# Patient Record
Sex: Male | Born: 1960 | Race: White | Hispanic: No | Marital: Married | Smoking: Never smoker
Health system: Southern US, Community
[De-identification: ages and names within clinical notes are randomized; demographics above are authoritative.]

## PROBLEM LIST (undated history)

## (undated) DIAGNOSIS — K635 Polyp of colon: Secondary | ICD-10-CM

## (undated) DIAGNOSIS — G4730 Sleep apnea, unspecified: Secondary | ICD-10-CM

## (undated) DIAGNOSIS — Z87442 Personal history of urinary calculi: Secondary | ICD-10-CM

## (undated) DIAGNOSIS — K449 Diaphragmatic hernia without obstruction or gangrene: Secondary | ICD-10-CM

## (undated) DIAGNOSIS — K219 Gastro-esophageal reflux disease without esophagitis: Secondary | ICD-10-CM

## (undated) DIAGNOSIS — E785 Hyperlipidemia, unspecified: Secondary | ICD-10-CM

## (undated) DIAGNOSIS — I219 Acute myocardial infarction, unspecified: Secondary | ICD-10-CM

## (undated) DIAGNOSIS — I2510 Atherosclerotic heart disease of native coronary artery without angina pectoris: Secondary | ICD-10-CM

## (undated) DIAGNOSIS — Z8719 Personal history of other diseases of the digestive system: Secondary | ICD-10-CM

## (undated) DIAGNOSIS — I10 Essential (primary) hypertension: Secondary | ICD-10-CM

## (undated) DIAGNOSIS — I209 Angina pectoris, unspecified: Secondary | ICD-10-CM

## (undated) DIAGNOSIS — M1990 Unspecified osteoarthritis, unspecified site: Secondary | ICD-10-CM

## (undated) DIAGNOSIS — Z8489 Family history of other specified conditions: Secondary | ICD-10-CM

## (undated) DIAGNOSIS — T7840XA Allergy, unspecified, initial encounter: Secondary | ICD-10-CM

## (undated) DIAGNOSIS — K5790 Diverticulosis of intestine, part unspecified, without perforation or abscess without bleeding: Secondary | ICD-10-CM

## (undated) DIAGNOSIS — C187 Malignant neoplasm of sigmoid colon: Secondary | ICD-10-CM

## (undated) DIAGNOSIS — C7800 Secondary malignant neoplasm of unspecified lung: Secondary | ICD-10-CM

## (undated) HISTORY — DX: Essential (primary) hypertension: I10

## (undated) HISTORY — DX: Hyperlipidemia, unspecified: E78.5

## (undated) HISTORY — PX: COLON SURGERY: SHX602

## (undated) HISTORY — PX: TONSILLECTOMY: SUR1361

## (undated) HISTORY — DX: Gastro-esophageal reflux disease without esophagitis: K21.9

## (undated) HISTORY — PX: LUNG REMOVAL, PARTIAL: SHX233

## (undated) HISTORY — DX: Atherosclerotic heart disease of native coronary artery without angina pectoris: I25.10

## (undated) HISTORY — DX: Diaphragmatic hernia without obstruction or gangrene: K44.9

## (undated) HISTORY — DX: Polyp of colon: K63.5

## (undated) HISTORY — DX: Personal history of other diseases of the digestive system: Z87.19

## (undated) HISTORY — DX: Malignant neoplasm of sigmoid colon: C18.7

## (undated) HISTORY — DX: Sleep apnea, unspecified: G47.30

## (undated) HISTORY — DX: Acute myocardial infarction, unspecified: I21.9

## (undated) HISTORY — DX: Secondary malignant neoplasm of unspecified lung: C78.00

## (undated) HISTORY — DX: Allergy, unspecified, initial encounter: T78.40XA

## (undated) HISTORY — PX: KNEE SURGERY: SHX244

## (undated) HISTORY — PX: CORONARY ANGIOPLASTY: SHX604

## (undated) HISTORY — PX: CYST REMOVAL NECK: SHX6281

## (undated) HISTORY — PX: COLONOSCOPY: SHX174

## (undated) HISTORY — DX: Diverticulosis of intestine, part unspecified, without perforation or abscess without bleeding: K57.90

---

## 1996-05-18 HISTORY — PX: COLON RESECTION: SHX5231

## 1996-12-16 DIAGNOSIS — C187 Malignant neoplasm of sigmoid colon: Secondary | ICD-10-CM

## 1996-12-16 HISTORY — DX: Malignant neoplasm of sigmoid colon: C18.7

## 1996-12-22 DIAGNOSIS — C187 Malignant neoplasm of sigmoid colon: Secondary | ICD-10-CM | POA: Insufficient documentation

## 1996-12-29 HISTORY — PX: COLON RESECTION: SHX5231

## 1997-05-18 HISTORY — PX: LUNG REMOVAL, PARTIAL: SHX233

## 1997-06-27 DIAGNOSIS — C7800 Secondary malignant neoplasm of unspecified lung: Secondary | ICD-10-CM

## 1997-06-27 HISTORY — DX: Secondary malignant neoplasm of unspecified lung: C78.00

## 2004-03-21 ENCOUNTER — Ambulatory Visit: Payer: Self-pay | Admitting: Cardiology

## 2004-04-03 ENCOUNTER — Ambulatory Visit: Payer: Self-pay

## 2004-11-07 ENCOUNTER — Ambulatory Visit (HOSPITAL_BASED_OUTPATIENT_CLINIC_OR_DEPARTMENT_OTHER): Admission: RE | Admit: 2004-11-07 | Discharge: 2004-11-07 | Payer: Self-pay | Admitting: Orthopedic Surgery

## 2004-11-07 ENCOUNTER — Ambulatory Visit (HOSPITAL_COMMUNITY): Admission: RE | Admit: 2004-11-07 | Discharge: 2004-11-07 | Payer: Self-pay | Admitting: Orthopedic Surgery

## 2004-11-07 ENCOUNTER — Encounter (INDEPENDENT_AMBULATORY_CARE_PROVIDER_SITE_OTHER): Payer: Self-pay | Admitting: *Deleted

## 2005-05-18 HISTORY — PX: CORONARY STENT PLACEMENT: SHX1402

## 2005-07-01 ENCOUNTER — Ambulatory Visit: Payer: Self-pay | Admitting: Cardiology

## 2005-07-14 ENCOUNTER — Ambulatory Visit: Payer: Self-pay | Admitting: Internal Medicine

## 2005-07-14 ENCOUNTER — Inpatient Hospital Stay (HOSPITAL_BASED_OUTPATIENT_CLINIC_OR_DEPARTMENT_OTHER): Admission: RE | Admit: 2005-07-14 | Discharge: 2005-07-14 | Payer: Self-pay | Admitting: Cardiology

## 2005-07-20 ENCOUNTER — Ambulatory Visit: Payer: Self-pay | Admitting: Cardiology

## 2005-07-20 ENCOUNTER — Inpatient Hospital Stay (HOSPITAL_COMMUNITY): Admission: RE | Admit: 2005-07-20 | Discharge: 2005-07-21 | Payer: Self-pay | Admitting: Cardiology

## 2005-07-30 ENCOUNTER — Ambulatory Visit: Payer: Self-pay | Admitting: Cardiology

## 2006-09-10 ENCOUNTER — Ambulatory Visit: Payer: Self-pay | Admitting: Cardiology

## 2007-03-04 ENCOUNTER — Ambulatory Visit: Payer: Self-pay | Admitting: Internal Medicine

## 2007-03-28 ENCOUNTER — Encounter: Payer: Self-pay | Admitting: Internal Medicine

## 2007-03-28 ENCOUNTER — Ambulatory Visit: Payer: Self-pay | Admitting: Internal Medicine

## 2007-03-28 DIAGNOSIS — K449 Diaphragmatic hernia without obstruction or gangrene: Secondary | ICD-10-CM | POA: Insufficient documentation

## 2007-07-21 DIAGNOSIS — C7800 Secondary malignant neoplasm of unspecified lung: Secondary | ICD-10-CM | POA: Insufficient documentation

## 2007-07-21 DIAGNOSIS — I2510 Atherosclerotic heart disease of native coronary artery without angina pectoris: Secondary | ICD-10-CM | POA: Insufficient documentation

## 2007-07-21 DIAGNOSIS — G4730 Sleep apnea, unspecified: Secondary | ICD-10-CM | POA: Insufficient documentation

## 2007-07-21 DIAGNOSIS — I10 Essential (primary) hypertension: Secondary | ICD-10-CM | POA: Insufficient documentation

## 2007-07-21 DIAGNOSIS — E785 Hyperlipidemia, unspecified: Secondary | ICD-10-CM | POA: Insufficient documentation

## 2007-07-21 DIAGNOSIS — G4733 Obstructive sleep apnea (adult) (pediatric): Secondary | ICD-10-CM | POA: Insufficient documentation

## 2007-09-09 ENCOUNTER — Ambulatory Visit: Payer: Self-pay | Admitting: Cardiology

## 2007-10-14 ENCOUNTER — Telehealth: Payer: Self-pay | Admitting: Internal Medicine

## 2008-08-10 ENCOUNTER — Ambulatory Visit: Payer: Self-pay | Admitting: Cardiology

## 2008-10-21 ENCOUNTER — Emergency Department (HOSPITAL_COMMUNITY): Admission: EM | Admit: 2008-10-21 | Discharge: 2008-10-21 | Payer: Self-pay | Admitting: Emergency Medicine

## 2008-11-02 ENCOUNTER — Ambulatory Visit: Payer: Self-pay | Admitting: Cardiology

## 2008-11-09 ENCOUNTER — Ambulatory Visit: Payer: Self-pay | Admitting: Cardiology

## 2008-11-23 ENCOUNTER — Telehealth: Payer: Self-pay | Admitting: Cardiology

## 2009-06-24 ENCOUNTER — Telehealth: Payer: Self-pay | Admitting: Internal Medicine

## 2009-06-27 DIAGNOSIS — Z8719 Personal history of other diseases of the digestive system: Secondary | ICD-10-CM | POA: Insufficient documentation

## 2009-06-28 ENCOUNTER — Ambulatory Visit: Payer: Self-pay | Admitting: Internal Medicine

## 2009-06-28 ENCOUNTER — Encounter (INDEPENDENT_AMBULATORY_CARE_PROVIDER_SITE_OTHER): Payer: Self-pay | Admitting: *Deleted

## 2009-06-28 DIAGNOSIS — K625 Hemorrhage of anus and rectum: Secondary | ICD-10-CM | POA: Insufficient documentation

## 2009-06-28 DIAGNOSIS — K219 Gastro-esophageal reflux disease without esophagitis: Secondary | ICD-10-CM | POA: Insufficient documentation

## 2009-06-28 DIAGNOSIS — Z85038 Personal history of other malignant neoplasm of large intestine: Secondary | ICD-10-CM | POA: Insufficient documentation

## 2009-06-28 LAB — CONVERTED CEMR LAB
ALT: 30 units/L (ref 0–53)
Albumin: 4.2 g/dL (ref 3.5–5.2)
Basophils Absolute: 0 10*3/uL (ref 0.0–0.1)
CO2: 31 meq/L (ref 19–32)
Calcium: 9.5 mg/dL (ref 8.4–10.5)
Chloride: 103 meq/L (ref 96–112)
Eosinophils Relative: 3 % (ref 0.0–5.0)
GFR calc non Af Amer: 127.55 mL/min (ref >60)
Glucose, Bld: 99 mg/dL (ref 70–99)
Lymphocytes Relative: 23.4 % (ref 12.0–46.0)
Lymphs Abs: 1.5 10*3/uL (ref 0.7–4.0)
Monocytes Relative: 8.4 % (ref 3.0–12.0)
Neutrophils Relative %: 65.2 % (ref 43.0–77.0)
Platelets: 208 10*3/uL (ref 150.0–400.0)
RDW: 12 % (ref 11.5–14.6)
Sodium: 140 meq/L (ref 135–145)
Total Bilirubin: 0.6 mg/dL (ref 0.3–1.2)
Total Protein: 6.6 g/dL (ref 6.0–8.3)
WBC: 6.6 10*3/uL (ref 4.5–10.5)

## 2009-08-02 ENCOUNTER — Ambulatory Visit: Payer: Self-pay | Admitting: Internal Medicine

## 2009-08-06 ENCOUNTER — Encounter: Payer: Self-pay | Admitting: Internal Medicine

## 2009-09-02 ENCOUNTER — Ambulatory Visit: Payer: Self-pay | Admitting: Cardiology

## 2009-10-21 ENCOUNTER — Telehealth: Payer: Self-pay | Admitting: Internal Medicine

## 2010-06-17 NOTE — Assessment & Plan Note (Signed)
Summary: EC6  Medications Added CRESTOR 20 MG TABS (ROSUVASTATIN CALCIUM) take one tablet once daily NIASPAN 500 MG CR-TABS (NIACIN (ANTIHYPERLIPIDEMIC)) take one tablet daily PLAVIX 75 MG TABS (CLOPIDOGREL BISULFATE) once daily-pt has not taken since Feb.      Allergies Added: NKDA  Primary Provider:  Gari Crown, [place]  CC:  ec6/  Pt to reestablish.  .  History of Present Illness: The patient is 50 years old and comes in today to get established for cardiology care in [place]. In 2007 he presented with unstable angina and was treated with 2 drug-eluting stents to the LAD and diagonal branch of the LAD by Dr. Samule Ohm. He had a V. stent technique with a 2.75 x 20 mm Taxus in the LAD and a 2.5 x 20 mm Taxus in the diagonal. He has done well since that time has had no recent chest pain shortness of breath or palpitations. He did have chest pain last summer and had a exercise test which was negative.  His other problems include hypertension and hyperlipidemia and obstructive sleep apnea. 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 He is had no recurrence since that time. He'll so has Barrett's esophagus and is followed by Dr. Lina Sar.  He lives in [place] but works in [place] and a disease here for him to obtain his medical care here in [place] that he would like to get established here.  Current Medications (verified): 1)  Aspirin 325 Mg Tabs (Aspirin) .... Take One By Mouth Once Daily 2)  Multivitamins  Tabs (Multiple Vitamin) .... Take One By Mouth Once Daily 3)  Norvasc 5 Mg Tabs (Amlodipine Besylate) .... Take One By Mouth Once Daily 4)  Crestor 20 Mg Tabs (Rosuvastatin Calcium) .... Take One Tablet Once Daily 5)  Cpap .... Use While Sleeping 6)  Lovaza 1 Gm Caps (Omega-3-Acid Ethyl Esters) .... Take One By Mouth Once Daily 7)   Niaspan 500 Mg Cr-Tabs (Niacin (Antihyperlipidemic)) .... Take One Tablet Daily 8)  Vitamin D3 Super Strength 2000 Unit Tabs (Cholecalciferol) .... Once Daily 9)  Claritin 10 Mg Tabs (Loratadine) .... Take 1 Tablet By Mouth Daily 10)  Plavix 75 Mg Tabs (Clopidogrel Bisulfate) .... Once Daily-Pt Has Not Taken Since Feb. 11)  Fexofenadine Hcl 180 Mg Tabs (Fexofenadine Hcl) .... Once Daily 12)  Tylenol Sinus Max St 30-500 Mg Tabs (Pseudoephedrine-Apap) .... Take 2 Tablet By Mouth Two Times A Day As Needed  Allergies (verified): No Known Drug Allergies  Past History:  Past Medical History: Reviewed history from 06/28/2009 and no changes required. 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   Vital Signs:  Patient profile:   50 year old male Height:      71 inches Weight:      213 pounds BMI:     29.81 Pulse rate:   86 / minute BP sitting:   124 / 80  (left arm) Cuff size:   large  Vitals Entered By: Judithe Modest CMA (September 02, 2009 4:42 PM)  Physical Exam  Additional  Exam:  Gen. Well-nourished, in no distress   Neck: No JVD, thyroid not enlarged, no carotid bruits Lungs: No tachypnea, clear without rales, rhonchi or wheezes Cardiovascular: Rhythm regular, PMI not displaced,  heart sounds  normal, no murmurs or gallops, no peripheral edema, pulses normal in all 4 extremities. Abdomen: BS normal, abdomen soft and non-tender without masses or organomegaly, no hepatosplenomegaly. MS: No deformities, no cyanosis or clubbing   Neuro:  No focal sns   Skin:  no lesions    Impression &  Recommendations:  Problem # 1:  CAD (ICD-414.00) He had the standing with 2 drug-eluting stents in 2007 to the LAD and diagonal branch. He had negative stress test last summer and he said no recent symptoms. Her biggest problem is stable. Because he has first-generation drug-eluting stents in because he has 2 stents with a V. tach the, I think his risk of late stent thrombosis is higher than usual and I have recommended that he stay on the Plavix. His updated medication list for this problem includes:    Aspirin 325 Mg Tabs (Aspirin) .Marland Kitchen... Take one by mouth once daily    Norvasc 5 Mg Tabs (Amlodipine besylate) .Marland Kitchen... Take one by mouth once daily    Plavix 75 Mg Tabs (Clopidogrel bisulfate) ..... Once daily-pt has not taken since feb.  Orders: EKG w/ Interpretation (93000)  Problem # 2:  HYPERTENSION (ICD-401.9) This appears well controlled on current medications. His updated medication list for this problem includes:    Aspirin 325 Mg Tabs (Aspirin) .Marland Kitchen... Take one by mouth once daily    Norvasc 5 Mg Tabs (Amlodipine besylate) .Marland Kitchen... Take one by mouth once daily  Problem # 3:  DYSLIPIDEMIA (ICD-272.4) He had a recent lipid profile which he says is good. We will continue his current medications. His updated medication list for this problem includes:    Crestor 20 Mg Tabs (Rosuvastatin calcium) .Marland Kitchen... Take one tablet once daily    Lovaza 1 Gm Caps (Omega-3-acid ethyl esters) .Marland Kitchen... Take one by mouth once daily    Niaspan 500 Mg Cr-tabs (Niacin (antihyperlipidemic)) .Marland Kitchen... Take one tablet daily  Patient Instructions: 1)  Your physician wants you to follow-up in: 1 year with Dr. Shirlee Latch.  You will receive a reminder letter in the mail two months in advance. If you don't receive a letter, please call our office to schedule the follow-up appointment. 2)  Your physician recommends that you continue on your current medications as directed. Please refer to the Current Medication list given to you today.

## 2010-06-17 NOTE — Assessment & Plan Note (Signed)
Summary: HX. COLON CANCER.   CONSTIPATION AND RECTAL BLEEDING    (DR.B...    History of Present Illness Visit Type: Follow-up Visit Primary GI MD: [name] Primary Provider: [name] Chief Complaint: constipation/rectal bleedikng, hx of colon cancer History of Present Illness:   50 YO MALE KNOWN TO DR. [name] WHO WAS DX WITH COLON CANCER IN 1998. HE UNDERWENT SIGMOID RESECTION. 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 HE HAS HAD ROUTINE ONCOLOGY FOLLOW UP IN [place] WITH DR. [name] ,AND HAS HAD SERIAL COLONOSCOPIES. LAST COLON WAS DONE IN 11/08 AND WAS NORMAL. HE ALSO HAS HX OF GERD AND BARRETTS. LAST EGD 11/08,BX'S NEGATIVE FOR BARRETTS. PT HAS HX OF HEART DISEASE,IS S/P TAXUS STENTS X 2 IN 2007. HE WAS ON PLAVIX FOR ONE YEAR THEN STOPPED,RECENTLY PLACED BACK ON IT X 3 MONTHS.  HE COMES IN TODAY WITH INTERMITTENT BRB PER RECTUM OVER THE PAST COUPLE WEEKS. HE IS SEEING BLOOD ON THE TISSUE, NO MIXED WITH STOOL. HE HAD BEEN CONSTIPATED ,THINKS MAY HAVE AGGRAVATED THE BLEEDING. NOW ON METAMUCIL WHICH IS HELPING. NO RECTAL PAIN,DISCOMFORT,NO ABDOMINALPAIN. HE IS ON NEXIUM DAILY,NO C/O DYSPHAGIA . HE HAD BEEN OFF PPI FOR AWHILE AND REFLUX SXS RECURRED -BACK ON NEXIUM PAST 3 MONTHS. HE WOULD LIKE TO HAVE AN ENDOSCOPY AS WELL.   GI Review of Systems     Location of  Abdominal pain: RUQ.    Denies abdominal pain, acid reflux, belching, bloating, chest pain, dysphagia with liquids, dysphagia with solids, heartburn, loss of appetite, nausea, vomiting, vomiting blood, weight loss, and  weight gain.      Reports constipation and  rectal bleeding.     Denies anal fissure, black tarry stools, change in bowel habit, diarrhea, diverticulosis, fecal incontinence, heme positive stool, hemorrhoids, irritable bowel syndrome, jaundice, light color stool, liver problems, and  rectal pain.    Current Medications (verified): 1)   Aspirin 325 Mg Tabs (Aspirin) .... Take One By Mouth Once Daily 2)  Multivitamins  Tabs (Multiple Vitamin) .... Take One By Mouth Once Daily 3)  Norvasc 5 Mg Tabs (Amlodipine Besylate) .... Take One By Mouth Once Daily 4)  Crestor 10 Mg Tabs (Rosuvastatin Calcium) .... Take One By Mouth Once Daily 5)  Cpap .... Use While Sleeping 6)  Lovaza 1 Gm Caps (Omega-3-Acid Ethyl Esters) .... Take One By Mouth Once Daily 7)  Niaspan 500 Mg Cr-Tabs (Niacin (Antihyperlipidemic)) .... Take One By Mouth Every Morning and Two By Mouth Every Evening 8)  Vitamin D3 Super Strength 2000 Unit Tabs (Cholecalciferol) .... Once Daily 9)  Claritin 10 Mg Tabs (Loratadine) .... Take 1 Tablet By Mouth Daily 10)  Plavix 75 Mg Tabs (Clopidogrel Bisulfate) .... Once Daily 11)  Fexofenadine Hcl 180 Mg Tabs (Fexofenadine Hcl) .... Once Daily 12)  Tylenol Sinus Max St 30-500 Mg Tabs (Pseudoephedrine-Apap) .... Take 2 Tablet By Mouth Two Times A Day As Needed 13)  Metamucil 30.9 % Powd (Psyllium) .... 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 Family History of Diabetes: father Family History of Heart Disease: mother, father No FH of Colon Cancer: Family History of Colon Polyps: mother  Social History: Reviewed history from 01/11/2009 and no changes  required. Full Time Married  Tobacco Use - No.  Alcohol Use - no Drug Use - no  Review of Systems       The patient complains of allergy/sinus and heart rhythm changes.  The patient denies anemia, anxiety-new, arthritis/joint pain, back pain, blood in urine, breast changes/lumps, change in vision, confusion, cough, coughing up blood, depression-new, fainting, fatigue, fever, headaches-new, hearing problems, heart murmur, itching, muscle pains/cramps, night sweats, nosebleeds, pregnancy symptoms, shortness of breath, skin rash, sleeping problems, sore throat, swelling of feet/legs, swollen lymph glands, thirst - excessive, urination - excessive, urination changes/pain, urine leakage, vision changes, and voice change.         ROS OTHERWISE AS IN HPI  Vital Signs:  Patient profile:   50 year old male Height:      71 inches Weight:      211.25 pounds BMI:     29.57 Pulse rate:   72 / minute Pulse rhythm:   regular BP sitting:   144 / 80  (left arm) Cuff size:   regular  Vitals Entered By: June McMurray CMA Duncan Dull) (June 28, 2009 9:41 AM)  Physical Exam  General:  Well developed, well nourished, no acute distress. Head:  Normocephalic and atraumatic. Eyes:  PERRLA, no icterus. Lungs:  Clear throughout to auscultation. Heart:  Regular rate and rhythm; no murmurs, rubs,  or bruits. Abdomen:  SOFT,NONTENDER, NO MASS OR HSM,MIDLINE INCISIONAL SCAR, NO MASS OR PALP HSM Rectal:  NOT DONE Extremities:  No clubbing, cyanosis, edema or deformities noted. Neurologic:  Alert and  oriented x4;  grossly normal neurologically. Psych:  Alert and cooperative. Normal mood and affect.   Impression & Recommendations:  Problem # 1:  RECTAL BLEEDING (ICD-569.3) Assessment New 50 YO MALE WITH FEW WEEK HX OF INTERMITTENT HEMATOCHEZIA ,IN SETTING OF HX OF SIGMOID COLON CANCER 1998;LATER WITH MET TO LUNG RESECTED 2000. R/O RECURRENT NEOPLASM,VS LOCAL ANORECTAL SOURCE.   LABS AS BELOW  SCHEDULE  FOR COLONOSCOPY WITH DR BRODIE, WILL GET CARDIOLOGY CLEARANCE REGARDING COMING OFF PLAVIX 5 DAYS PRE PROCEDURE. Orders: Colon/Endo (Colon/Endo) TLB-CMP (Comprehensive Metabolic Pnl) (80053-COMP) TLB-CBC Platelet - w/Differential (85025-CBCD)  Problem # 2:  PERSONAL HX COLON CANCER (ICD-V10.05) Assessment: Comment Only SEE ABOVE  Problem # 3:  GERD (ICD-530.81) Assessment: Unchanged CHRONIC  HX OF BARRETTS-HOWEVER LAST EGD 2008 NEGATIVE FOR BARRETTS. SCHEULE FOR FOLLOW UP EGD WITH DR BRODIE. CONTINUE NEXIUM 40 MG DAILY  Problem # 4:  CAD (ICD-414.00) Assessment: Comment Only S/P TAXUS STENTS X 2 2007-ON PLAVIX AND ASA.  Problem # 5:  SLEEP APNEA (ICD-780.57) Assessment: Comment Only  Problem # 6:  HYPERTENSION (ICD-401.9) Assessment: Comment Only  Patient Instructions: 1)  Go to the basement to for you labs.  2)  Come for you Colonoscopy/EGD with Dr. [name] on 08/02/2009.  3)  We will contact you about coming off your Plavix for your procedure after we hear back from Dr. Andee Lineman. 4)  Your prep has been sent to [facility]. 5)  The medication list was reviewed and reconciled.  All changed / newly prescribed medications were explained.  A complete medication list was provided to the patient / caregiver. Prescriptions: MOVIPREP 100 GM  SOLR (PEG-KCL-NACL-NASULF-NA ASC-C) As per  prep instructions.  #1 x 0   Entered by:   Harlow Mares CMA (AAMA)   Authorized by:   Sammuel Cooper PA-c   Signed by:   Harlow Mares CMA (AAMA) on 06/28/2009   Method used:   Electronically to        [facility] [address] (retail)       [address] [place], [place]  [address]       Ph: [phone]       Fax: [phone]   RxID:   [id number]

## 2010-06-17 NOTE — Letter (Signed)
Summary: [facility] Instructions  [facility]  [address] [place], [place] [address]   Phone: [phone]  Fax: [phone]       [name] [name]    [birth year]    MRN: [id number]        Procedure Day /Date: 08/02/2009 (Friday)     Arrival Time: 12:30pm       Procedure Time: 1:30pm     Location of Procedure:                     X  [facility] (4th Floor)   PREPARATION FOR COLONOSCOPY WITH MOVIPREP   Starting 5 days prior to your procedure 07/28/2009 do not eat nuts, seeds, popcorn, corn, beans, peas,  salads, or any raw vegetables.  Do not take any fiber supplements (e.g. Metamucil, Citrucel, and Benefiber).  THE DAY BEFORE YOUR PROCEDURE         DATE: 08/01/2009  DAY: Thursday  1.  Drink clear liquids the entire day-NO SOLID FOOD  2.  Do not drink anything colored red or purple.  Avoid juices with pulp.  No orange juice.  3.  Drink at least 64 oz. (8 glasses) of fluid/clear liquids during the day to prevent dehydration and help the prep work efficiently.  CLEAR LIQUIDS INCLUDE: Water Jello Ice Popsicles Tea (sugar ok, no milk/cream) Powdered fruit flavored drinks Coffee (sugar ok, no milk/cream) Gatorade Juice: apple, white grape, white cranberry  Lemonade Clear bullion, consomm, broth Carbonated beverages (any kind) Strained chicken noodle soup Hard Candy                             4.  In the morning, mix first dose of MoviPrep solution:    Empty 1 Pouch A and 1 Pouch B into the disposable container    Add lukewarm drinking water to the top line of the container. Mix to dissolve    Refrigerate (mixed solution should be used within 24 hrs)  5.  Begin drinking the prep at 5:00 p.m. The MoviPrep container is divided by 4 marks.   Every 15 minutes drink the solution down to the next mark (approximately 8 oz) until the full liter is complete.   6.  Follow completed prep with 16 oz of clear liquid of your choice (Nothing red or  purple).  Continue to drink clear liquids until bedtime.  7.  Before going to bed, mix second dose of MoviPrep solution:    Empty 1 Pouch A and 1 Pouch B into the disposable container    Add lukewarm drinking water to the top line of the container. Mix to dissolve    Refrigerate  THE DAY OF YOUR PROCEDURE      DATE: 08/02/2009 DAY: Friday  Beginning at 8:30am (5 hours before procedure):         1. Every 15 minutes, drink the solution down to the next mark (approx 8 oz) until the full liter is complete.  2. Follow completed prep with 16 oz. of clear liquid of your choice.    3. You may drink clear liquids until 11:30am (2 HOURS BEFORE PROCEDURE).   MEDICATION INSTRUCTIONS  Unless otherwise instructed, you should take regular prescription medications with a small sip of water   as early as possible the morning of your procedure.  You will be contacted by our office prior to your procedure for directions on holding your Plavix.  If you  do not hear from our office 1 week prior to your scheduled procedure, please call [phone] to discuss.        OTHER INSTRUCTIONS  You will need a responsible adult at least 50 years of age to accompany you and drive you home.   This person must remain in the waiting room during your procedure.  Wear loose fitting clothing that is easily removed.  Leave jewelry and other valuables at home.  However, you may wish to bring a book to read or  an iPod/MP3 player to listen to music as you wait for your procedure to start.  Remove all body piercing jewelry and leave at home.  Total time from sign-in until discharge is approximately 2-3 hours.  You should go home directly after your procedure and rest.  You can resume normal activities the  day after your procedure.       The day of your procedure you should not:   Drive   Make legal decisions   Operate machinery   Drink alcohol   Return to work  You will receive specific  instructions about eating, activities and medications before you leave.    The above instructions have been reviewed and explained to me by   _______________________    I fully understand and can verbalize these instructions _____________________________ Date _________

## 2010-06-17 NOTE — Letter (Signed)
Summary: Anticoagulation Modification Letter  [facility]  [address] [place], [place] [address]   Phone: [phone]  Fax: [phone]    June 28, 2009  Re:    [name] [name] DOB:    1960/07/07 MRN:    [id number]    Dear Dr. Andee Lineman:  We have scheduled the above patient for an endoscopic procedure. Our records show that  he/she is on anticoagulation therapy. Please advise as to how long the patient may come off their therapy of Plavix prior to the scheduled procedure(s) on 08/02/2009.   Please fax  route the completed form to [facility], CMA (AAMA).   Thank you for your help with this matter.  Sincerely,  Harlow Mares CMA Duncan Dull)   Physician Recommendation:  Hold Plavix 7 days prior ________________  Hold Coumadin 5 days prior ____________  Other ______________________________     Appended Document: Anticoagulation Modification Letter the patient and hold Plavix 7 days prior to procedure.restart Plavix as soon as possible after procedure. if you have any other questions please contact me.  Appended Document: Anticoagulation Modification Letter Left message on patients machine to call back.   Appended Document: Anticoagulation Modification Letter patient aware

## 2010-06-17 NOTE — Letter (Signed)
Summary: Patient Notice-[facility]  [address] [place], [place] [address]   Phone: [phone]  Fax: [phone]        August 06, 2009 MRN: [id number]    [facility] [address] [place], [place]  [address]    Dear Mr. PILLEY,  I am pleased to inform you that the biopsies taken during your recent endoscopic examination did not show any evidence of cancer upon pathologic examination.  However, your biopsies indicate you have a condition known as Barrett's esophagus. While not cancer, it is pre-cancerous (can progress to cancer) and needs to be monitored with repeat endoscopic examination and biopsies.  Fortunately, it is quite rare that this develops into cancer, but careful monitoring of the condition along with taking your medication as prescribed is important in reducing the risk of developing cancer.  It is my recommendation that you have a repeat upper gastrointestinal endoscopic examination in 2_ years.  Additional information/recommendations:  __Please call [phone] to schedule a return visit to further      evaluate your condition.  _x_Continue with treatment plan as outlined the day of your exam.  Please call us if you have or develop heartburn, reflux symptoms, any swallowing problems, or if you have questions about your condition that have not been fully answered at this time.  Sincerely,  Hart Carwin MD  This letter has been electronically signed by your physician.  Appended Document: Patient Notice-[facility] Esopghagus Letter mailed 3.24.11

## 2010-06-17 NOTE — Procedures (Signed)
Summary: Colonoscopy  Patient: [name] [name] Note: All result statuses are Final unless otherwise noted.  Tests: (1) Colonoscopy (COL)   COL Colonoscopy           DONE     [facility]     [address]     [place], [place]  [address]           COLONOSCOPY PROCEDURE REPORT           PATIENT:  [name] [name], [name] [name]  MR#:  [id number]     BIRTHDATE:  10-Feb-1961, 48 yrs. old  GENDER:  male           ENDOSCOPIST:  Hedwig Morton. [name], MD     Referred by:  Corrie Mckusick, M.D.           PROCEDURE DATE:  08/02/2009     PROCEDURE:  Colonoscopy 19147     ASA CLASS:  Class II     INDICATIONS:  [facility] adenoca left colon 1998, 3/15 nodes positive,     lung met resected 2000,     last colon 2008,     rectal bleeding on Plavix           MEDICATIONS:   Versed 2 mg, Fentanyl 25 mcg           DESCRIPTION OF PROCEDURE:   After the risks benefits and     alternatives of the procedure were thoroughly explained, informed     consent was obtained.  Digital rectal exam was performed and     revealed no rectal masses.   The LB [id number]-[id number] endoscope     was introduced through the anus and advanced to the cecum, which     was identified by both the appendix and ileocecal valve, without     limitations.  The quality of the prep was good, using MoviPrep.     The instrument was then slowly withdrawn as the colon was fully     examined.     <<PROCEDUREIMAGES>>           FINDINGS:  There was evidence of a prior segmental colectomy (see     image8 and image9). wide open sigmoid anastomosis at 45 cm,  A     sessile polyp was found in the sigmoid colon. fold vs polyp at 30     cm, biopsied The polyp was removed using cold biopsy forceps (see     image6).  Mild diverticulosis was found in the sigmoid colon (see     image7).  A fissure was found in the rectum (see image11 and     image10).  normal cecum (see image2, image3, image4, and image5).     Retroflexion was not performed.  The scope was  then withdrawn from     the patient and the procedure completed.           COMPLICATIONS:  None           ENDOSCOPIC IMPRESSION:     1) Prior segmental colectomy     2) Sessile polyp in the sigmoid colon     3) Mild diverticulosis in the sigmoid colon     4) Fissure in the rectum     5) Normal cecum     s/p sigmoid resection for carcinoma 1998, no evidence of     recurrence     RECOMMENDATIONS:     1) Await biopsy results           REPEAT  EXAM:  In 5 year(s) for.           ______________________________     Hedwig Morton. [name], MD           CC:           n.     eSIGNED:   Hedwig Morton. Kyerra Vargo at 08/02/2009 02:36 PM           Page 2 of 3   [place], [place], [id number]  Note: An exclamation mark (!) indicates a result that was not dispersed into the flowsheet. Document Creation Date: 08/02/2009 2:36 PM _______________________________________________________________________  (1) Order result status: Final Collection or observation date-time: 08/02/2009 14:14 Requested date-time:  Receipt date-time:  Reported date-time:  Referring Physician:   Ordering Physician: Lina Sar [phone]) Specimen Source:  Source: Launa Grill Order Number: [id number] Lab site:   Appended Document: Colonoscopy     Procedures Next Due Date:    Colonoscopy: 08/2014

## 2010-06-17 NOTE — Procedures (Signed)
Summary: Upper Endoscopy  Patient: [name] [name] Note: All result statuses are Final unless otherwise noted.  Tests: (1) Upper Endoscopy (EGD)   EGD Upper Endoscopy       DONE     [facility]     [address]     [place], [place]  [address]           ENDOSCOPY PROCEDURE REPORT           PATIENT:  [name], [name]  MR#:  [id number]     BIRTHDATE:  1960-06-07, 48 yrs. old  GENDER:  male           ENDOSCOPIST:  Hedwig Morton. [name], MD     Referred by:           PROCEDURE DATE:  08/02/2009     PROCEDURE:  EGD with biopsy     ASA CLASS:  Class II     INDICATIONS:  h/o Barrett's Esophagus Barrett's 2001, no Barrett's     2006           MEDICATIONS:   Versed 8 mg, Fentanyl 75 mcg     TOPICAL ANESTHETIC:  Exactacain Spray           DESCRIPTION OF PROCEDURE:   After the risks benefits and     alternatives of the procedure were thoroughly explained, informed     consent was obtained.  The LB GIF-H180 [id number] endoscope was     introduced through the mouth and advanced to the descending     duodenum, without limitations.  The instrument was slowly     withdrawn as the mucosa was fully examined.     <<PROCEDUREIMAGES>>           The upper, middle, and distal third of the esophagus were     carefully inspected and no abnormalities were noted. The z-line     was well seen at the GEJ. The endoscope was pushed into the fundus     which was normal including a retroflexed view. The antrum,gastric     body, first and second part of the duodenum were unremarkable.     normal z-line, no stricture     several fundic gland polyps With standard forceps, a biopsy was     obtained and sent to pathology (see image1, image2, image3,     image4, and image5).    Retroflexed views revealed no     abnormalities.    The scope was then withdrawn from the patient     and the procedure completed.           COMPLICATIONS:  None           ENDOSCOPIC IMPRESSION:     1) Normal EGD     s/p biopsies  at the g-e junction     no endoscopic evidence of Barrett'     RECOMMENDATIONS:     resume Plavix as per Your cardiologist           REPEAT EXAM:  In 5 year(s) for.           ______________________________     Hedwig Morton. [name], MD           CC:           n.     eSIGNED:   Hedwig Morton. Brodie at 08/02/2009 02:29 PM           Alanda Amass, [phone]  Note: An exclamation mark (!) indicates a result  that was not dispersed into the flowsheet. Document Creation Date: 08/02/2009 2:29 PM _______________________________________________________________________  (1) Order result status: Final Collection or observation date-time: 08/02/2009 13:52 Requested date-time:  Receipt date-time:  Reported date-time:  Referring Physician:   Ordering Physician: Lina Sar [id number]) Specimen Source:  Source: Launa Grill Order Number: [id number] Lab site:   Appended Document: Upper Endoscopy     Procedures Next Due Date:    EGD: 08/2011

## 2010-06-17 NOTE — Progress Notes (Signed)
Summary: TRIAGE   Phone Note Call from Patient Call back at [phone]   Caller: spouse  [name] [name] Call For: Dr. [name] Reason for Call: Talk to Nurse Summary of Call: Pt has a HX of cancer and is bleeding from rectum and has had some constipation and changes in BM. Wants to know if he can be seen sooner than 08-02-09. Initial call taken by: Karna Christmas,  June 24, 2009 3:51 PM  Follow-up for Phone Call        Per pt. wife-pt. c/o increased constipation and rectal bleeding for several weeks, metamucil and other things he has tried aren't helping. Has a history of colon cancer.  Pt. is in [place], can only be seen on a Friday. He will see [name] [name] on 06-28-09 at 9:30am. If symptoms become worse call back immediately or go to ER.  Follow-up by: Laureen Ochs LPN,  June 24, 2009 4:05 PM

## 2010-06-17 NOTE — Progress Notes (Signed)
Summary: Refill  Medications Added ANALPRAM-HC 1-2.5 % CREA (HYDROCORTISONE ACE-PRAMOXINE) Apply to rectum three times a day as needed       Phone Note From Other Clinic   Caller: [facility] Call For: Dr. [name] Summary of Call: Pt is needing a refill on Analpram  90 day supply mail order  [phone] Ph# Option 2 Initial call taken by: Karna Christmas,  October 21, 2009 9:20 AM  Follow-up for Phone Call        Prescription sent to [facility]. Follow-up by: Lamona Curl CMA Duncan Dull),  October 21, 2009 9:29 AM    New/Updated Medications: ANALPRAM-HC 1-2.5 % CREA (HYDROCORTISONE ACE-PRAMOXINE) Apply to rectum three times a day as needed Prescriptions: ANALPRAM-HC 1-2.5 % CREA (HYDROCORTISONE ACE-PRAMOXINE) Apply to rectum three times a day as needed  #60 grams x 0   Entered by:   Lamona Curl CMA (AAMA)   Authorized by:   Hart Carwin MD   Signed by:   Lamona Curl CMA (AAMA) on 10/21/2009   Method used:   Electronically to        [facility]* (mail-order)             ,          Ph: [phone]       Fax: [phone]   RxID:   [id number]

## 2010-06-17 NOTE — Letter (Signed)
Summary: Patient Notice- Polyp Results  [facility]  [address] [place], [place] [address]   Phone: [phone]  Fax: [phone]        August 06, 2009 MRN: [id number]    [name] [address] [place], [place]  [address]    Dear [name],  I am pleased to inform you that the colon polyp(s) removed during your recent colonoscopy was (were) found to be benign (no cancer detected) upon pathologic examination.The polyp was hyperplastic ( not precancerous)  I recommend you have a repeat colonoscopy examination in 5_ years to look for recurrent polyps, as having colon polyps increases your risk for having recurrent polyps or even colon cancer in the future.  Should you develop new or worsening symptoms of abdominal pain, bowel habit changes or bleeding from the rectum or bowels, please schedule an evaluation with either your primary care physician or with me.  Additional information/recommendations:  _x_ No further action with gastroenterology is needed at this time. Please      follow-up with your primary care physician for your other healthcare      needs.  __ Please call [phone] to schedule a return visit to review your      situation.  __ Please keep your follow-up visit as already scheduled.  __ Continue treatment plan as outlined the day of your exam.  Please call us if you are having persistent problems or have questions about your condition that have not been fully answered at this time.  Sincerely,  Hart Carwin MD  This letter has been electronically signed by your physician.  Appended Document: Patient Notice- Polyp Results Letter mailed 3.24.11

## 2010-06-17 NOTE — Miscellaneous (Signed)
Summary: analpram cream prescription  Clinical Lists Changes  Medications: Added new medication of ANALPRAM-HC 1-2.5 %  CREA (HYDROCORTISONE ACE-PRAMOXINE) apply three times a day for fissure - Signed Rx of ANALPRAM-HC 1-2.5 %  CREA (HYDROCORTISONE ACE-PRAMOXINE) apply three times a day for fissure;  #30 gms x 0;  Signed;  Entered by: Laverna Peace RN;  Authorized by: Hart Carwin MD;  Method used: Electronically to [id number]*, [address] [place], [place], [place]  [address], Ph: [phone], Fax: [phone]    Prescriptions: ANALPRAM-HC 1-2.5 %  CREA (HYDROCORTISONE ACE-PRAMOXINE) apply three times a day for fissure  #30 gms x 0   Entered by:   Laverna Peace RN   Authorized by:   Hart Carwin MD   Signed by:   Laverna Peace RN on 08/02/2009   Method used:   Electronically to        [facility] [phone]* (retail)       [address] [place], [place]  [address]       Ph: [phone]       Fax: [phone]   RxID:   [id number]

## 2010-08-22 ENCOUNTER — Encounter: Payer: Self-pay | Admitting: Cardiology

## 2010-08-25 LAB — DIFFERENTIAL
Eosinophils Relative: 0 % (ref 0–5)
Lymphocytes Relative: 14 % (ref 12–46)
Lymphs Abs: 1.4 10*3/uL (ref 0.7–4.0)
Monocytes Relative: 6 % (ref 3–12)

## 2010-08-25 LAB — POCT I-STAT, CHEM 8
BUN: 17 mg/dL (ref 6–23)
Creatinine, Ser: 1 mg/dL (ref 0.4–1.5)
Glucose, Bld: 114 mg/dL — ABNORMAL HIGH (ref 70–99)
Potassium: 4 mEq/L (ref 3.5–5.1)
Sodium: 143 mEq/L (ref 135–145)
TCO2: 27 mmol/L (ref 0–100)

## 2010-08-25 LAB — POCT CARDIAC MARKERS
CKMB, poc: <1 ng/mL — ABNORMAL LOW (ref 1.0–8.0)
CKMB, poc: <1 ng/mL — ABNORMAL LOW (ref 1.0–8.0)
Myoglobin, poc: 66.4 ng/mL (ref 12–200)
Myoglobin, poc: 72.3 ng/mL (ref 12–200)

## 2010-08-25 LAB — CBC
HCT: 45.8 % (ref 39.0–52.0)
MCV: 90.6 fL (ref 78.0–100.0)
Platelets: 227 10*3/uL (ref 150–400)
RBC: 5.05 MIL/uL (ref 4.22–5.81)
WBC: 9.7 10*3/uL (ref 4.0–10.5)

## 2010-08-27 ENCOUNTER — Encounter: Payer: Self-pay | Admitting: Cardiology

## 2010-08-27 ENCOUNTER — Ambulatory Visit (INDEPENDENT_AMBULATORY_CARE_PROVIDER_SITE_OTHER): Payer: BC Managed Care – PPO | Admitting: Cardiology

## 2010-08-27 VITALS — BP 130/84 | HR 67 | Ht 72.0 in | Wt 203.0 lb

## 2010-08-27 DIAGNOSIS — I10 Essential (primary) hypertension: Secondary | ICD-10-CM

## 2010-08-27 DIAGNOSIS — I2510 Atherosclerotic heart disease of native coronary artery without angina pectoris: Secondary | ICD-10-CM

## 2010-08-27 DIAGNOSIS — E785 Hyperlipidemia, unspecified: Secondary | ICD-10-CM

## 2010-08-27 DIAGNOSIS — G4730 Sleep apnea, unspecified: Secondary | ICD-10-CM

## 2010-08-27 LAB — LIPID PANEL
LDL Cholesterol: 82 mg/dL (ref 0–99)
Total CHOL/HDL Ratio: 3

## 2010-08-27 LAB — HEPATIC FUNCTION PANEL
ALT: 23 U/L (ref 0–53)
AST: 22 U/L (ref 0–37)
Alkaline Phosphatase: 91 U/L (ref 39–117)
Bilirubin, Direct: 0.1 mg/dL (ref 0.0–0.3)
Total Bilirubin: 0.6 mg/dL (ref 0.3–1.2)

## 2010-08-27 LAB — BASIC METABOLIC PANEL
Chloride: 103 mEq/L (ref 96–112)
Potassium: 4.3 mEq/L (ref 3.5–5.1)

## 2010-08-27 NOTE — Assessment & Plan Note (Signed)
Stable with no ischemic symptoms.  Continue ASA (can decrease to 81 mg daily) and statin.  He should also continue Plavix long-term given the complicated PCI he underwent in 2007 (V-stent technique to LAD and diagonal).

## 2010-08-27 NOTE — Progress Notes (Signed)
PCP: Dr. Vernell Leep [facility])  50 yo with history of CAD s/p PCI to LAD and diagonal, HTN, and hyperlipidemia presents for cardiology followup.  Patient lives in [place] but works full time for [employer] in the [place] area.  He has not had any chest pain.  He is able to climb up multiple flights of steps without significant dyspnea.  No palpitations/syncope/lightheadedness.  BP is reasonable today.   ECG: NSR with single PVC  PMH: 1. HTN 2. Hyperlipidemia 3. OSA: Uses CPAP 4. CAD: Unstable angina in 2007, DES to LAD and DES to diagonal (V stent technique so long-term Plavix).  Myoview (4/10): ? In [place], was normal per Dr. Regino Schultze notes.   5. Colon cancer: Treated with surgery and chemotherapy.  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  6. GERD  SH: Married with children.  Lives in [place], works for [employer] in [place].  Nonsmoker.    FH: Uncle with MI at 38, another uncle with MI at 70.   ROS: All systems reviewed and negative except as per HPI.   Current Outpatient Prescriptions  Medication Sig Dispense Refill  . amLODipine (NORVASC) 5 MG tablet Take 5 mg by mouth daily.        Marland Kitchen aspirin 325 MG tablet Take 325 mg by mouth daily.        . Cholecalciferol (VITAMIN D3) 2000 UNITS TABS Take by mouth daily.        . clopidogrel (PLAVIX) 75 MG tablet Take 75 mg by mouth daily.        . fexofenadine (ALLEGRA) 180 MG tablet Take 180 mg by mouth daily.        Marland Kitchen loratadine (CLARITIN) 10 MG tablet Take 10 mg by mouth as needed.       . Multiple Vitamin (MULTIVITAMIN) tablet Take 1 tablet by mouth daily.        . niacin (NIASPAN) 500 MG CR tablet Take 500 mg by mouth at bedtime.        Marland Kitchen omega-3 acid ethyl esters (LOVAZA) 1 G capsule Take 2 g by mouth daily.        . rosuvastatin (CRESTOR) 20 MG tablet Take 20 mg by mouth daily.        Marland Kitchen DISCONTD: hydrocortisone-pramoxine (ANALPRAM-HC) 2.5-1 % rectal cream Place rectally 3 (three) times  daily.        Marland Kitchen DISCONTD: pseudoephedrine-acetaminophen (TYLENOL SINUS) 30-500 MG TABS Take 2 tablets by mouth 2 (two) times daily as needed.          BP 130/84  Pulse 67  Ht 6' (1.829 m)  Wt 203 lb (92.08 kg)  BMI 27.53 kg/m2 General: NAD Neck: No JVD, no thyromegaly or thyroid nodule.  Lungs: Clear to auscultation bilaterally with normal respiratory effort. CV: Nondisplaced PMI.  Heart regular S1/S2, no S3/S4, no murmur.  No peripheral edema.  No carotid bruit.  Normal pedal pulses.  Abdomen: Soft, nontender, no hepatosplenomegaly, no distention.  Neurologic: Alert and oriented x 3.  Psych: Normal affect. Extremities: No clubbing or cyanosis.

## 2010-08-27 NOTE — Assessment & Plan Note (Signed)
LDL is a little above goal (82 today, goal < 70).  I encouraged him to work on diet and exercise.

## 2010-08-27 NOTE — Patient Instructions (Addendum)
Your physician recommends that you have lab today---BMP/Lipid/Liver profile---414.01  Your physician wants you to follow-up in: 1 year with Dr Shirlee Latch.(April 2013). You will receive a reminder letter in the mail two months in advance. If you don't receive a letter, please call our office to schedule the follow-up appointment.

## 2010-08-27 NOTE — Assessment & Plan Note (Signed)
BP is at goal today on amlodipine.

## 2010-08-27 NOTE — Assessment & Plan Note (Signed)
Using CPAP 

## 2010-09-01 ENCOUNTER — Encounter: Payer: Self-pay | Admitting: *Deleted

## 2010-09-03 ENCOUNTER — Other Ambulatory Visit: Payer: Self-pay | Admitting: Internal Medicine

## 2010-09-30 NOTE — Assessment & Plan Note (Signed)
[facility]                         GASTROENTEROLOGY OFFICE NOTE   NAME:[name], [name]                      MRN:          [id number]  DATE:03/04/2007                            DOB:          04/20/1961    Mr. Grafton is a very nice 50 year old white male who is here today to  establish GI care after his physicians from [facility] left.  He was  diagnosed with adenocarcinoma of the sigmoid colon in 1998, and  underwent sigmoid resection, followed by chemotherapy with leucovorin  and 5-FU.  He had lung mets recurrence in 2000, which was resected, and  he has done quite well.  He has seen Dr. [name] in [place] on a  yearly basis with the last appointment in October 2007.  He is supposed  to get a CT scan of the chest and abdomen on a yearly basis.  His  appointment with Dr. [name] is upcoming.  His CEA levels have been  normal.  He has left upper quadrant abdominal discomfort which was  present prior to his colon cancer diagnosis and really has not changed  any in intensity or location.  His bowel habits are regular.  He denies  rectal bleeding.  He is overall doing quite well.  He also was diagnosed with a Barrett's esophagus on an upper endoscopy  in 2004, but the repeat upper endoscopy in 2006 did not confirm the  presence of intestinal metaplasia.  The last colonoscopy was done in  August 2006, and it showed no evidence of recurrent cancer with normal-  appearing sigmoid anastomosis.  The patient's weight has been stable.  He has had some recent fluctuation with weight gain of about 12 pounds  and he is trying to get back to his usual weight.   MEDICATIONS:  1. Plavix 75 mg p.o. daily.  2. Prilosec 20 mg p.o. daily.  3. Aspirin 81 mg p.o. daily.  4. Multiple vitamins.  5. Norvasc 5 mg p.o. daily.  6. Crestor 10 mg p.o. daily.  7. Fish oil.   PAST HISTORY:  Significant for:  1. Coronary artery disease with coronary intervention by Dr.  Samule Ohm      about a year ago.  He has 2 stents placed and has been on Plavix      since then.  2. He has a history of high blood pressure.  3. Hyperlipidemia.  4. Kidney stones.  5. Allergies.  6. Sleep apnea.   OPERATIONS:  1. He had a stomach ulcer when he was a teenager.  2. He had a colorectal surgery in 1998.  3. And, coronary stent in February 2007.   FAMILY HISTORY:  Positive for heart disease in mother, father.  Diabetes  in father.   SOCIAL HISTORY:  Married with 3 children.  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  He does not smoke and does not drink alcohol.   REVIEW OF SYSTEMS:  Positive for allergies.   PHYSICAL EXAMINATION:  VITAL SIGNS:  Blood pressure 140/90, pulse 68 and  slightly irregular, weight 212 pounds.  GENERAL:  He was alert and oriented  in no distress.  LUNGS:  Clear to auscultation.  COR:  Irregular S1 and S2.  ABDOMEN:  Soft, nontender with normoactive bowel sounds.  Well healed  midline surgical scar.  RECTAL:  Normal rectal tone.  Stool was hemoccult negative.   IMPRESSION:  1. A 50 year old white male, status post sigmoid resection for colon      cancer in 1998, status post chemotherapy Port-A-Cath insertion and      removal, status post recurrent metastatic lung cancer status post      resection in 2004, last colonoscopy was negative in 2006.  2. History of Barrett's esophagus, not confirmed on last colonoscopy      in 2006.  3. The patient has gastroesophageal reflux disease, currently      controlled on Prilosec.  4. Dyslipidemia.  5. Hypertension.  6. Obstructive sleep apnea.  7. Status post cardiac catheterization and percutaneous intervention      with a drug-eluting stent x2 in February 2007.  The patient is      currently on Plavix.   PLAN:  1. Upper endoscopy/colonoscopy scheduled while the patient will be on      Plavix.  He will discontinue his aspirin 5 days prior to the      procedures.  2. Refills for Prilosec 20  mg a day, non-generic mail order for a 90-      day supply.  3. Follow up with Dr. [name] in the next month or two.  At that      time, he will most likely need a CT scan of the chest and abdomen      as well as a CEA level.     Hedwig Morton. [name], MD  Electronically Signed    DMB/MedQ  DD: 03/04/2007  DT: 03/06/2007  Job #: [id number]   cc:   Corrie Mckusick  Timothey Brotherton

## 2010-09-30 NOTE — Assessment & Plan Note (Signed)
[facility]                          [facility] OFFICE NOTE   NAME:Dominik, Maitland                      MRN:          [id number]  DATE:08/10/2008                            DOB:          1961-01-27    HISTORY OF PRESENT ILLNESS:  The patient is a 50 year old male with a  history of coronary artery disease status post stenting of the LAD and  second marginal branch in March 2007.  The patient also has a history of  colon cancer with metastasis to the left lung and is status post partial  colectomy and left lung wedge resection in 2000.  The patient states  that he has no chest pain, but he has occasional lightheadedness.  He  has no orthopnea, PND, palpitations, or syncope.  His blood pressure  however in the office is extremely poorly controlled and his initial  pressure reading was 172/114, which was confirmed by a pressure that I  took personally.  He is somewhat reluctant to accept the diagnosis of  significant hypertension and feels that his blood pressure is usually  mostly under control.   MEDICATIONS:  1. Lovaza 2 tablets p.o. b.i.d.  2. Crestor 20 mg p.o. daily.  3. Amlodipine 5 mg p.o. b.i.d.  4. Vitamin D.  5. [employer] Vitapak.  6. Aspirin 325 mg p.o. b.i.d.   Singulair, nitro, and Claritin are p.r.n.   PHYSICAL EXAMINATION:  VITAL SIGNS:  Blood pressure 172/114, heart rate  71, weight 210 pounds.  GENERAL:  Well-nourished white male in no apparent distress.  HEENT:  Pupils, eyes clear.  Conjunctivae clear.  NECK:  Supple, normal carotid upstroke, and no carotid bruits.  LUNGS:  Clear breath sounds bilaterally.  HEART:  Regular rate and rhythm with normal S1 and S2.  No murmur, rubs,  or gallops.  ABDOMEN:  Soft and nontender.  No rebound or guarding.  Good bowel  sounds.  EXTREMITIES:  No cyanosis, clubbing, or edema.   PROBLEM LIST:  1. Single-vessel coronary artery disease.      a.     Status post Taxus stenting of the left  anterior descending       (coronary artery) and second diagonal branch, March 2007.      b.     Hyperdynamic left ventricular function, ejection fraction       70%.  No wall motion abnormalities.  2. Dyslipidemia.  3. Hypertension, uncontrolled.  4. History of colon cancer metastasis to the left lung.      a.     Status post partial colectomy/left lung wedge resection       2000.  5. Gastroesophageal reflux disease/history of Barrett esophagus.  6. Asymptomatic premature ventricular contractions.   PLAN:  1. The patient has severe hypertension which is poorly controlled.  I      have added enalapril 5 mg p.o. daily.  He will likely require up      titration of his medical therapy and the addition of a diuretic.  2. I have asked the patient to come back in 1 week for a nurse visit  and to check his blood pressure, and I will add additional      medications as needed.  The patient did report to me that in the      past he could not tolerate hydrochlorothiazide very well as it made      him extremely dizzy, so we may need to be looking at other      additional therapies.     Learta Codding, MD,FACC  Electronically Signed    GED/MedQ  DD: 08/12/2008  DT: 08/13/2008  Job #: [id number]   cc:   Loel Lofty, MD

## 2010-09-30 NOTE — Assessment & Plan Note (Signed)
[facility]                          [facility] OFFICE NOTE   NAME:[name], [name]                      MRN:          [id number]  DATE:09/09/2007                            DOB:          [birth year]    PRIMARY CARDIOLOGIST:  Dr. Lewayne Bunting.   REASON FOR VISIT:  Annual follow-up.   [name] continues to do well from a cardiovascular standpoint, with  no interim development of any exertional chest discomfort.  He is now 2  years out from undergoing Taxus stenting of the LAD and second diagonal  branch.  Left ventricular function was normal.   When last seen here in the clinic, I discontinued Caduet and started him  on Crestor 10 daily.  This was recently up titrated to 20 mg, for more  aggressive lipid management.  The patient remains on fish oil, of note.  I also recommended initiation of lisinopril 5 daily for blood pressure  control.  He reports to me today that he was not able to tolerate this,  however.  He was thus placed on amlodipine, instead.   The patient expressed concern regarding recent information about counter  effects of proton pump inhibitors (i.e., Prilosec) with Plavix.  He  informs me that he has been diagnosed with Barrett's esophagus in the  past, which has remained quiescent on Prilosec.  He is also concerned  regarding this issue because of his history of colon cancer, with mets  of the left lung requiring surgery.   At time of his cardiac catheterization, recommendation was that he  remain on aspirin/Plavix, indefinitely.   Electrocardiogram today reveals NSR at 79 bpm with ventricular  quadrigeminy; no acute ischemic changes.   MEDICATIONS:  1. Full dose aspirin.  2. Plavix.  3. Lovaza 2 tablets b.i.d.  4. Amlodipine 5 daily.  5. Crestor 20 daily.  6. Prilosec daily. Marland Kitchen   REVIEW OF SYSTEMS:  As per HPI, remaining systems negative.   PHYSICAL EXAMINATION:  Blood pressure 163/103, pulse 75 regular, weight  212 (up 7 pounds).  GENERAL:  A 50 year old male, well-nourished and well-developed, sitting  upright, in no distress.  HEENT:  Normocephalic, atraumatic.  NECK:  Palpable bilateral carotid pulses without bruits; no JVD.  LUNGS:  Clear to auscultation in all fields.  HEART:  Regular rate and rhythm (S1, S2). No significant murmurs.  No  rubs or gallops.  ABDOMEN:  Soft, nontender, intact bowel sounds.  EXTREMITIES:  No significant edema.  NEUROLOGIC:  No focal deficit.   IMPRESSION:  1. Single vessel coronary artery disease.      a.     Status post Taxus (V) stenting of LAD and second diagonal       branch, March 2007.      b.     Hyperdynamic left ventricular function (EF 70%); no WMAs.  2. Mixed dyslipidemia.  3. Hypertension, uncontrolled.  4. History of colon cancer with metastases to left lung.      a.     Status post partial colectomy/left lung wedge resection in       2000.  5. GERD/history  of Barrett's esophagus.  6. Asymptomatic PVCs.   PLAN:  1. Up titrate amlodipine to 10 mg daily, for more aggressive blood      pressure control.  2. We discussed at great length the importance of minimizing saturated      dietary fat, as much as possible. The patient needs aggressive      lipid control with a target LDL of 70, or less.  3. The patient is concerned about the possible negating effects of      Prilosec on Plavix, particularly in light of his history of      Barrett's esophagus. I therefore suggested that the Plavix be      discontinued, but that he remain on aspirin indefinitely. He will      need a stress Cardiolite next year when he returns for followup,      given that it will be 3 years since he underwent coronary artery      stenting.  4. Diminish caffeinated intake, in light of the documented PVCs.      Gene Serpe, PA-C  Electronically Signed      Learta Codding, MD,FACC  Electronically Signed   GS/MedQ  DD: 09/09/2007  DT: 09/09/2007  Job #:  [id number]   cc:   Corrie Mckusick

## 2010-09-30 NOTE — Assessment & Plan Note (Signed)
[facility]                          [facility] NOTE   NAME:[name], [name]                      MRN:          [id number]  DATE:11/02/2008                            DOB:          26-Apr-1961    PRIMARY CARDIOLOGIST:  Learta Codding, MD,FACC   REASON FOR VISIT:  Scheduled followup.   [name] [name] recently presented to [facility] ED with complaint of chest  pain.  This was the only instance of chest pain he has had since his  last visit.  This occurred while he was lying on a couch, shortly after  having had some breakfast.  It was not similar to his previous  presentation, when he underwent percutaneous intervention.  It was  described as a weight, not as the sharp pain he has had in the past.  He took one nitroglycerin tablet, with no effect, and the symptoms  resolved after approximately 2 hours.  He was pain-free when he arrived  to the ED.  Two sets of cardiac enzymes were normal.  No medical  intervention was provided.   [name] [name] has not had any recurrent symptoms.  He denies any  exertional angina pectoris.  He has not had a cardiac catheterization or  stress test, since undergoing percutaneous intervention in March 2007.   EKG in our office today indicates NSR at 73 bpm with normal axis, no  ischemic changes.   MEDICATIONS:  1. Enalapril 5 daily  2. Prilosec.  3. Amlodipine 5 b.i.d.  4. Crestor 20 daily.  5. Lovaza 1 g daily.  6. Niaspan 500 q.a.m./1000 q.p.m.  7. Aspirin 325 daily.   PHYSICAL EXAMINATION:  VITAL SIGNS:  Blood pressure 140/91, pulse 78,  regular and weight 199.  GENERAL:  A 50 year old male, sitting upright in no distress.  HEENT:  Normocephalic and atraumatic.  NECK:  Palpable bilateral carotid pulses without bruits.  LUNGS:  Clear to auscultation all fields.  HEART:  Regular rate and rhythm.  No significant murmurs.  No rubs.  ABDOMEN:  Benign.  EXTREMITIES:  Palpable pulses without edema.  NEUROLOGIC:  No focal deficits.   IMPRESSION:  1. Atypical chest pain.  2. Single-vessel CAD.      a.     Status post TAXUS (V) stenting of the LAD/second diagonal       branch, March 2007.      b.     Residual nonobstructive CAD.      c.     EF 70%; no wall motion abnormalities.  3. Mixed dyslipidemia.  4. Hypertension.  5. History of colon cancer/left lung metastases.      a.     Status post partial colectomy/left lung wedge resection       2000.  6. Gastroesophageal reflux disease/history of Barrett esophagus.  7. Asymptomatic PVCs.   PLAN:  1. Schedule exercise stress Cardiolite for risk stratification.  2. Continue current medication regimen.  3. Return clinic follow up with myself and Dr. Andee Lineman in 6 months, or      sooner pending review of stress test results.  Gene Serpe, PA-C  Electronically Signed      Learta Codding, MD,FACC  Electronically Signed   GS/MedQ  DD: 11/02/2008  DT: 11/03/2008  Job #: [id number]   cc:   Corrie Mckusick

## 2010-10-03 NOTE — Assessment & Plan Note (Signed)
[facility]                          [facility] NOTE   NAME:[name] [name], [name] [name]                      MRN:          [id number]  DATE:09/10/2006                            DOB:          1961/03/09    PRIMARY CARDIOLOGIST:  Learta Codding, M.D.   REASON FOR VISIT:  Annual followup.   [name] [name] presents to the clinic for annual followup and reports no  interim development of any exertional chest discomfort similar to the  symptoms that he had prior undergoing percutaneous intervention in March  2007.   The patient has chronic exertional dyspnea which has remained stable.  He is, however, concerned about a recent lipid profile suggesting as-yet  uncontrolled hyperlipidemia.  The lipid profile from February 2008  notable for total cholesterol 154, triglycerides 84, HDL 36, and LDL  101.  The patient does not smoke tobacco but does admit to having smoked  briefly as a teenager.  He also points out that he has been doing  extremely well since undergoing surgery for colon cancer about 10 years  ago, followed by left lung resection approximately 1-and-a-half years  later at [facility] secondary to metastatic lung cancer.  He is  being followed by an oncologist in [place] and states that he has done  extremely well since undergoing lung surgery.   CURRENT MEDICATIONS:  1. Plavix.  2. Full-dose aspirin.  3. Caduet 5/40.  4. Singulair 10 daily.  5. Fish oil.   PHYSICAL EXAMINATION:  VITAL SIGNS:  Blood pressure 144/97, pulse 59 and  regular, weight 205.6.  GENERAL:  A 50 year old male sitting upright in no distress.  HEENT:  Normocephalic, atraumatic.  NECK:  Palpable bilateral carotid pulses without bruits, no JVD.  LUNGS:  Clear to auscultation all fields.  HEART:  Regular rate and rhythm (S1, S2).  No murmurs, rubs or gallops.  ABDOMEN:  Soft, nontender.  EXTREMITIES:  No edema.  NEUROLOGIC:  No focal deficits.   IMPRESSION:  1. Coronary artery disease.      a.     Status post Taxus stenting of the left anterior descending       artery and second diagonal branch, March 2007.      b.     Residual nonobstructive coronary artery disease.      c.     Hyperdynamic left ventricular function (ejection fraction       70%).      d.     Recommended combination therapy with aspirin/Plavix       indefinitely.  2. Dyslipidemia.  3. Hypertension.  4. History of colon cancer with metastases to left lung.  Status post      remote surgery.   PLAN:  1. Discontinue Caduet and initial Crestor 10 mg daily for aggressive      lipid management, particularly in light of recent profile which      reveals relatively low HDL.  Given the patient does have a history      of hypertension, however, we will also have to start him on an      antihypertensive  and I recommend lisinopril 5 mg daily with up-      titration as needed.  2. Decrease aspirin from full-dose to 162 and ultimately to 81 daily.      In reviewing Dr. Melinda Crutch intervention note of last year,      recommendation is to remain on Plavix indefinitely in conjunction      with aspirin.  3. Check followup fasting lipids/liver profile in 3 months.  4. Schedule return clinic followup with myself and Dr. Andee Lineman in 1      year.      Gene Serpe, PA-C       Learta Codding, MD,FACC    GS/MedQ  DD: 09/10/2006  DT: 09/10/2006  Job #: [id number]   cc:   Corrie Mckusick

## 2010-10-03 NOTE — Op Note (Signed)
[name] [name], [name] [name]             ACCOUNT NO.:  [id number]   MEDICAL RECORD NO.:  [id number]          PATIENT TYPE:  AMB   LOCATION:  DSC                          FACILITY:  [facility]   PHYSICIAN:  Harvie Junior, M.D.   DATE OF BIRTH:  December 23, 1960   DATE OF PROCEDURE:  11/07/2004  DATE OF DISCHARGE:                                 OPERATIVE REPORT   PREOPERATIVE DIAGNOSIS:  Inflammation and tendinitis of the patellar tendon  with inflamed patellar bursa.   POSTOPERATIVE DIAGNOSIS:  Inflammation and tendinitis of the patellar tendon  with inflamed patellar bursa.   PROCEDURE:  1.  Excision of prepatellar bursitis.  2.  Excision of patellar tendinitis.  3.  Drilling and rongeuring of a bone inferior pole of the patella and      patellar tendon insertion site.   SURGEON:  Harvie Junior, M.D.   ASSISTANT:  Marshia Ly, P.A.-C.   ANESTHESIA:  General.   BRIEF HISTORY:  [name] is a 50 year old male with a long history of  having a falling type injury where he suffered pain on the anterior aspect  of his knee.  He persisted with complaints of pain over this area and  ultimately MRI was obtained which showed a fairly large area of tendinosus  in the patellar tendon.  He was followed conservatively for a prolonged  period of time, went through physical therapy, none of these things seemed  to work, and because of continued complaints of pain, we ultimately took him  to the operating room for excision.   DESCRIPTION OF PROCEDURE:  The patient was taken to the operating room and  after adequate anesthesia was obtained with general anesthetic, the patient  was placed on the operating table, the left leg was prepped and draped in  the usual sterile fashion.  Following this, the leg was exsanguinated and  blood pressure tourniquet was inflated to 300 mmHg.  A midline incision was  made from the inferior pole of the patella to the patellar tubercle.  The  subcutaneous tissue were  dissected down to the level of the patellar tendon.  The prepatellar bursa was excised.  The paratenon was identified and divided  carefully.  The patellar tendon was then split longitudinally and once this  was accomplished, the posterior aspect of the patella where this was easily  identifiable on MRI was debrided back to normal thickness of patellar  tendon.  It was a fairly easy demarcation between normal tendon and this  tendinosus and of fibrinous scar type tissue.  Once this was debrided from  the posterior aspect of the patellar tendon, with followed this up to the  patellar tendon origin and some drilling and rongeuring of the bone was  undertaken right at that level.  I then followed down to the tubercle where  the tendinosus was taken down to the level of the tubercle.  Once this was  accomplished, the wound was copiously irrigated and suctioned dry.  The  tissue was sent to pathology just for a check.  The patellar tendon was  closed with 3-0 Ethibond interrupted  buried sutures, the paratenon with 3-  0 Vicryl running suture, and the skin with 3-0 Vicryl and 3-0 Maxon pull out  suture.  Benzoin and Steri-Strips were applied.  The patient was taken to  the recovery room where he was noted to be in satisfactory condition.  Estimated blood loss for the procedure was none.       JLG/MEDQ  D:  11/07/2004  T:  11/07/2004  Job:  [id number]

## 2010-10-03 NOTE — Discharge Summary (Signed)
[name] [name], [name] [name]             ACCOUNT NO.:  [id number]   MEDICAL RECORD NO.:  [id number]          PATIENT TYPE:  INP   LOCATION:  6523                         FACILITY:  [facility]   PHYSICIAN:  Salvadore Farber, M.D. LHCDATE OF BIRTH:  12-20-1960   DATE OF ADMISSION:  07/20/2005  DATE OF DISCHARGE:  07/21/2005                                 DISCHARGE SUMMARY   PROCEDURES:  1.  Cardiac catheterization.  2.  Percutaneous intervention with a drug-eluting stent x2.   PRIMARY DIAGNOSIS:  Chest pain, status post two-vessel intervention this  admission.   SECONDARY DIAGNOSES:  1.  Family history of coronary artery disease.  2.  Dyslipidemia.  3.  Hypertension.  4.  History of colon cancer with metastasis to the left lung, status post      surgery.  5.  Status post chemotherapy with Port-A-Cath and its removal.  6.  History of sinus surgery.  7.  Obstructive sleep apnea.  8.  Hernia surgery, tonsillectomy and knee surgery x2.  9.  Gastroesophageal reflux disease.   HOSPITAL COURSE:  [name] [name] is a 50 year old male with no previous history  of coronary artery disease.  He had colon cancer with metastasis to the lung  and had surgery for both of those as well as chemotherapy and subsequent  Port-A-Cath removal.  He had chest pain in 2005 and a negative stress echo.  He again began having chest pain and was evaluated by cardiac CT, which  showed possible two-vessel coronary artery disease in the diagonal LAD.  He  came in for an outpatient joint venture catheterization on July 14, 2005.  It showed a 95% stenosis in the diagonal and a 70-80% stenosis in the  mid-LAD.  There was a 50% lesion in the posterolateral branch but no other  significant disease, and his EF was 70%.  He was brought back on July 20, 2005, for percutaneous intervention.   [name] [name] had TAXUS stent to the LAD and to the diagonal, reducing its  stenosis to 0.  He is on aspirin and Plavix for life.   The next day his postprocedure labs were within normal limits and his  postprocedure CK-MB was negative.  He was evaluated by Dr. Samule Ohm and  considered stable for discharge once seen by cardiac rehab with outpatient  follow-up arranged.   DISCHARGE INSTRUCTIONS:  1.  His activity level is to be increased gradually.  2.  He is to call our office for problems with the catheterization site.  3.  He is to stick to a low-fat diet.  4.  He is to follow up with Dr. Andee Lineman in [place] on March 15 at 2 o'clock and      with Dr. Vernell Leep in [place] as needed.   DISCHARGE MEDICATIONS:  1.  Aspirin 325 mg a day.  2.  Plavix 75 mg a day.  3.  Caduet 10/40 mg one-half tablet daily.  4.  Multivitamin daily.  5.  Fish oil 1 g daily.  6.  Sublingual nitroglycerin p.r.n.      Theodore Demark, P.A. [facility]  Salvadore Farber, M.D. [facility]  Electronically Signed    RB/MEDQ  D:  07/21/2005  T:  07/22/2005  Job:  [id number]   cc:   [facility]  [place], [place]   Pradeep Macy  Fax: [phone]

## 2010-10-03 NOTE — Cardiovascular Report (Signed)
NAMELORON, WEIMER             ACCOUNT NO.:  [id number]   MEDICAL RECORD NO.:  [id number]          PATIENT TYPE:  INP   LOCATION:  6523                         FACILITY:  [facility]   PHYSICIAN:  Salvadore Farber, M.D. LHCDATE OF BIRTH:  January 10, 1961   DATE OF PROCEDURE:  07/20/2005  DATE OF DISCHARGE:                              CARDIAC CATHETERIZATION   PROCEDURE:  V stenting of the LAD and second diagonal branch, intravascular  ultrasound of both the LAD and second diagonal branch.   INDICATIONS:  Mr. Kutzer is a 50 year old gentleman with family history of  premature atherosclerotic disease as well as a personal history of  dyslipidemia. He also has a history of colon cancer with metastasis to the  left lung. He is status post partial colectomy and subsequent left lung  wedge resection in 2000. He has been free of disease on surveillance since  then.   He now presents with two episodes of chest tightness occurring while  exercising on his exercise bike. He has also noted approximately six weeks  of exertional dyspnea. He underwent cardiac catheterization last week by Dr.  Gala Romney. This demonstrated 70-80% stenosis of the mid-LAD and a 90%  stenosis of a large diagonal branch. He returns today for planned  percutaneous intervention.   PROCEDURAL TECHNIQUE:  Informed consent was obtained. Under 1% lidocaine  local anesthesia, an 8-French sheath was placed in the right common femoral  artery using the modified Seldinger technique. The patient had been  preloaded on aspirin and Plavix. The Plavix should be continued for six  days. Bivalirudin was administered. ACT was confirmed to be greater than 225  seconds.   An 8-French CLS 3.5 guide was advanced over wire and engaged in the ostium  of the left main. Prowater wire was advanced to the distal LAD and then a  second Prowater wire into the distal second diagonal. The diagonal lesion  was predilated using a 2.25 x 15 mm  Maverick at 6 atmospheres. The lesions  were then simultaneously stented using V stent technique with a 2.75 x 20 mm  Taxus in the LAD and a 2.5 x 20 mm Taxus in the diagonal. These were  carefully positioned to begin exactly at the bifurcation but not lead to any  stent overlap. They were simultaneously deployed at 12 atmospheres. Both  stents were then postdilated in their entirety using 2.75 mm PowerSail  balloon at 16 atmospheres. The final inflation with a kissing inflation  using two 2.75 mm PowerSail just at the proximal portions of each stent both  at 16 atmospheres.   To confirm stent expansion and apposition, I then performed intravascular  ultrasound of first the LAD and then second the diagonal. This demonstrated  the stents to be positioned exactly as planned with no residual stenosis,  full stent expansion, and no dissection. Stents were fully apposed  throughout.   Final angiography demonstrated no residual stenosis and TIMI III flow to the  distal vasculature in both territories. The guide was removed over wire. The  arteriotomy was then closed using a Starclose device. Complete hemostasis  was obtained.  The patient was then transferred to the holding room in stable  condition having tolerated the procedure well.   COMPLICATIONS:  None.   IMPRESSION/PLAN:  Successful V stenting of the left anterior descending  artery and second diagonal branch using Taxus drug-eluting stents. The  patient should be maintained on a combination of aspirin and Plavix for  life. Subsequent surveillance colonoscopies should be performed with the  patient maintained on both aspirin and Plavix. Only if biopsy is absolutely  necessary should Plavix then be held temporarily.      Salvadore Farber, M.D. [facility]  Electronically Signed     WED/MEDQ  D:  07/20/2005  T:  07/21/2005  Job:  [id number]   cc:   Corrie Mckusick  Fax: [phone]   Learta Codding, M.D. [facility]  [address]  [address]  [place]  [place] [address]

## 2010-10-03 NOTE — Cardiovascular Report (Signed)
NAMERAE, SUTCLIFFE             ACCOUNT NO.:  [id number]   MEDICAL RECORD NO.:  [id number]          PATIENT TYPE:  OIB   LOCATION:  1965                         FACILITY:  [facility]   PHYSICIAN:  Arvilla Meres, M.D. LHCDATE OF BIRTH:  10/02/1960   DATE OF PROCEDURE:  07/14/2005  DATE OF DISCHARGE:                              CARDIAC CATHETERIZATION   PRIMARY CARE PHYSICIAN:  Dr. Sid Falcon. Pradhan   CARDIOLOGIST:  Dr. Andee Lineman   IDENTIFICATION:  [name] [name] is a delightful 50 year old male with a history  of hyperlipidemia as well as colon cancer with metastasis to the left lung  status post surgery and chemotherapy nine years ago who recently has had  some exertional chest pain in the context of taking Sudafed.  He had a  cardiac CT which suggested significant disease in the diagonal and possibly  the LAD and is thus referred for cardiac catheterization.   PROCEDURES PERFORMED:  1.  Selective coronary angiography.  2.  Left heart catheterization.  3.  Left ventriculogram.   DESCRIPTION OF PROCEDURE:  The risks and benefits of catheterization were  reviewed.  Consent was signed and placed on the chart.  A 4-French arterial  sheath was placed in the right femoral artery using a modified Seldinger  technique.  Standard preformed Judkins catheters including JL4, JR4, and  angled pigtail used for the procedure.   There were no apparent complications.   Central aortic pressure was 142/55 with a mean of 117.  LV pressure was  148/4 with an LVEDP of 9.  There was no aortic stenosis on pullback.   Left main was normal.   LAD was a long vessel wrapping the apex.  It gave off one small to moderate  diagonal and a second large branching diagonal.  In the mid LAD there was a  calcified 70-80 tubular lesion just after the takeoff of the second  diagonal.  This was followed by a 30% tubular irregularity.  In the second  large diagonal there was a tubular 90% stenosis.   The left  circumflex was moderate sized vessel, gave off a large OM1 and a  moderate-sized OM-2.  The distal AV groove circumflex was small.   The right coronary artery was a large dominant vessel.  It gave off a large  PDA and a large branching posterior lateral.  There was a 50% lesion in the  mid posterolateral branch.  There was a 30% tubular lesion in the proximal  right coronary artery.   Left ventriculogram done in the RAO approach showed an EF of 70% with no  wall motion abnormalities or mitral regurgitation.   ASSESSMENT:  One-vessel coronary artery disease with significant lesion in  the mid left anterior descending.  I will review the films with Dr. Samule Ohm.  I suspect he will be a candidate for angioplasty of his mid left anterior  descending and first diagonal is small.  I think this is probably best  treated medically.  He will need aggressive risk factor modification as is  already begun by Dr. Andee Lineman.      Arvilla Meres, M.D. [facility]  Electronically Signed     DB/MEDQ  D:  07/14/2005  T:  07/14/2005  Job:  [id number]   cc:   Corrie Mckusick  Fax: [phone]   Learta Codding, M.D. [facility]  [address]  [address]  [place]  [place] [address]

## 2011-07-15 ENCOUNTER — Encounter: Payer: Self-pay | Admitting: Internal Medicine

## 2011-08-31 ENCOUNTER — Encounter: Payer: Self-pay | Admitting: Cardiology

## 2011-08-31 ENCOUNTER — Ambulatory Visit (INDEPENDENT_AMBULATORY_CARE_PROVIDER_SITE_OTHER): Payer: BC Managed Care – PPO | Admitting: Cardiology

## 2011-08-31 DIAGNOSIS — I2510 Atherosclerotic heart disease of native coronary artery without angina pectoris: Secondary | ICD-10-CM

## 2011-08-31 DIAGNOSIS — I10 Essential (primary) hypertension: Secondary | ICD-10-CM

## 2011-08-31 DIAGNOSIS — E785 Hyperlipidemia, unspecified: Secondary | ICD-10-CM

## 2011-08-31 DIAGNOSIS — R079 Chest pain, unspecified: Secondary | ICD-10-CM

## 2011-08-31 NOTE — Progress Notes (Signed)
PCP: Dr. Vernell Leep [facility])  51 yo with history of CAD s/p PCI to LAD and diagonal, HTN, and hyperlipidemia presents for cardiology followup.  Patient lives in [place] but works full time for [employer] in the [place] area.  Main complaint today is periodic episodes of aching pain under his left shoulder blade.  This is not exertional and sometimes comes on when he is in he car driving.  It will last for about 30 seconds at a time.  His exercise tolerance is quite good with no exertional symptoms.  He also has occasional spells of lightheadedness with no particular trigger.  It is not associated with the back pain and is not positional.  No syncope.  [name] [name] is concerned today because his unstable angina symptoms were somewhat atypical back in 2007.  SBP is running in the 140s at home when he checks it, 142/94 today.   ECG: NSR, normal  Labs (4/12): K 4.3, creatinine 0.7, LDL 82, HDL 41  PMH: 1. HTN 2. Hyperlipidemia 3. OSA: Uses CPAP 4. CAD: Unstable angina in 2007, DES to LAD and DES to diagonal (V stent technique so long-term Plavix).  Myoview (4/10): ? In [place], was normal per Dr. Regino Schultze notes.   5. Colon cancer: Treated with surgery and chemotherapy.  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  6. GERD  SH: Married with children.  Lives in [place], works for [employer] in [place].  Nonsmoker.    FH: Uncle with MI at 47, another uncle with MI at 3.   ROS: All systems reviewed and negative except as per HPI.   Current Outpatient Prescriptions  Medication Sig Dispense Refill  . amLODipine (NORVASC) 5 MG tablet Take 5 mg by mouth 2 (two) times daily.       . ANALPRAM-HC 1-2.5 % rectal cream APPLY TO RECTUM THREE TIMES A DAY AS NEEDED  60 g  0  . aspirin 325 MG tablet Take 81 mg by mouth daily.       . Cholecalciferol (VITAMIN D3) 2000 UNITS TABS Take by mouth daily.        . clopidogrel (PLAVIX) 75 MG tablet Take 75 mg by mouth  daily.        Marland Kitchen DEXILANT 60 MG capsule Take 60 mg by mouth daily.       . enalapril (VASOTEC) 5 MG tablet Take 5 mg by mouth daily.       . fexofenadine (ALLEGRA) 180 MG tablet Take 180 mg by mouth as needed.       . loratadine (CLARITIN) 10 MG tablet Take 10 mg by mouth as needed.       . Multiple Vitamin (MULTIVITAMIN) tablet Take 1 tablet by mouth daily.        Marland Kitchen NASONEX 50 MCG/ACT nasal spray Place into the nose daily.       . niacin (NIASPAN) 500 MG CR tablet Take 500 mg by mouth at bedtime.        Marland Kitchen omega-3 acid ethyl esters (LOVAZA) 1 G capsule Take 2 g by mouth daily.        . rosuvastatin (CRESTOR) 20 MG tablet Take 20 mg by mouth daily.          BP 142/94  Pulse 72  Ht 6' (1.829 m)  Wt 210 lb 6.4 oz (95.437 kg)  BMI 28.54 kg/m2 General: NAD Neck: No JVD, no thyromegaly or thyroid nodule.  Lungs: Clear to auscultation bilaterally with normal respiratory effort. CV: Nondisplaced  PMI.  Heart regular S1/S2, no S3/S4, no murmur.  No peripheral edema.  No carotid bruit.  Normal pedal pulses.  Abdomen: Soft, nontender, no hepatosplenomegaly, no distention.  Neurologic: Alert and oriented x 3.  Psych: Normal affect. Extremities: No clubbing or cyanosis.

## 2011-08-31 NOTE — Patient Instructions (Signed)
Your physician has requested that you have en exercise stress myoview. For further information please visit [email or web address] Please follow instruction sheet, as given.  Your physician recommends that you return for a FASTING lipid profile /BMET when you have the myoview.   Your physician wants you to follow-up in: 1 year with Dr Shirlee Latch. (April 2014).You will receive a reminder letter in the mail two months in advance. If you don't receive a letter, please call our office to schedule the follow-up appointment.

## 2011-09-01 NOTE — Assessment & Plan Note (Signed)
Pain under left shoulder blade periodically and spells of lightheadedness.  These symptoms are atypical for CAD, but his initial presentation with unstable angina in 2007 was atypical per his report as well.  Continue ASA, Plavix, statin, ACEI.  I am going to set him up for a treadmill myoview for risk stratification.

## 2011-09-01 NOTE — Assessment & Plan Note (Signed)
Goal LDL < 70.  Will check lipids.  

## 2011-09-01 NOTE — Assessment & Plan Note (Signed)
SBP in 140s.  Increase enalapril to 5 mg bid. BMET in 2 wks.

## 2011-09-09 ENCOUNTER — Encounter (HOSPITAL_COMMUNITY): Payer: BC Managed Care – PPO

## 2011-09-14 ENCOUNTER — Other Ambulatory Visit (INDEPENDENT_AMBULATORY_CARE_PROVIDER_SITE_OTHER): Payer: BC Managed Care – PPO

## 2011-09-14 ENCOUNTER — Ambulatory Visit (HOSPITAL_COMMUNITY): Payer: BC Managed Care – PPO | Attending: Cardiology | Admitting: Radiology

## 2011-09-14 DIAGNOSIS — R079 Chest pain, unspecified: Secondary | ICD-10-CM | POA: Insufficient documentation

## 2011-09-14 DIAGNOSIS — I2510 Atherosclerotic heart disease of native coronary artery without angina pectoris: Secondary | ICD-10-CM

## 2011-09-14 DIAGNOSIS — R002 Palpitations: Secondary | ICD-10-CM | POA: Insufficient documentation

## 2011-09-14 DIAGNOSIS — E785 Hyperlipidemia, unspecified: Secondary | ICD-10-CM | POA: Insufficient documentation

## 2011-09-14 DIAGNOSIS — I4949 Other premature depolarization: Secondary | ICD-10-CM

## 2011-09-14 DIAGNOSIS — R42 Dizziness and giddiness: Secondary | ICD-10-CM | POA: Insufficient documentation

## 2011-09-14 DIAGNOSIS — I10 Essential (primary) hypertension: Secondary | ICD-10-CM | POA: Insufficient documentation

## 2011-09-14 DIAGNOSIS — Z8249 Family history of ischemic heart disease and other diseases of the circulatory system: Secondary | ICD-10-CM | POA: Insufficient documentation

## 2011-09-14 LAB — BASIC METABOLIC PANEL
BUN: 16 mg/dL (ref 6–23)
Calcium: 9.7 mg/dL (ref 8.4–10.5)
Creatinine, Ser: 0.8 mg/dL (ref 0.4–1.5)
GFR: 103.85 mL/min (ref >60.00)
Glucose, Bld: 105 mg/dL — ABNORMAL HIGH (ref 70–99)

## 2011-09-14 LAB — LIPID PANEL: VLDL: 9 mg/dL (ref 0.0–40.0)

## 2011-09-14 MED ORDER — TECHNETIUM TC 99M TETROFOSMIN IV KIT
30.0000 | PACK | Freq: Once | INTRAVENOUS | Status: AC | PRN
  Administered 2011-09-14: 30 via INTRAVENOUS

## 2011-09-14 MED ORDER — TECHNETIUM TC 99M TETROFOSMIN IV KIT
10.0000 | PACK | Freq: Once | INTRAVENOUS | Status: AC | PRN
  Administered 2011-09-14: 10 via INTRAVENOUS

## 2011-09-14 NOTE — Progress Notes (Addendum)
[facility] SITE 3 NUCLEAR MED [address] [place] [address] [phone]  Cardiology Nuclear Med Study  [name] [name] is a 51 y.o. male     MRN : [id number]     DOB: Apr 28, 1961  Procedure Date: 09/14/2011  Nuclear Med Background Indication for Stress Test:  Evaluation for Ischemia and Stent Patency History: 2000: H/O chemo, 2005: Stress ECHO: NL, 2007: Heart Cath: N/O Dz EF: 70% Stents:LAD and Diagonal Cardiac Risk Factors: Family History - CAD, Hypertension and Lipids  Symptoms:  Chest Pain, Light-Headedness and Palpitations   Nuclear Pre-Procedure Caffeine/Decaff Intake:  None NPO After: 7:00pm   Lungs:  clear O2 Sat: 98% on room air. IV 0.9% NS with Angio Cath:  20g  IV Site: R Hand  IV Started by:  Cathlyn Parsons, RN  Chest Size (in):  46 Cup Size: n/a  Height: 6' (1.829 m)  Weight:  204 lb (92.534 kg)  BMI:  Body mass index is 27.67 kg/(m^2). Tech Comments:  n/a    Nuclear Med Study 1 or 2 day study: 1 day  Stress Test Type:  Stress  Reading MD: Olga Millers, MD  Order Authorizing Provider:  Fransico Meadow  Resting Radionuclide: Technetium 52m Tetrofosmin  Resting Radionuclide Dose: 11.0 mCi   Stress Radionuclide:  Technetium 72m Tetrofosmin  Stress Radionuclide Dose: 33.0 mCi           Stress Protocol Rest HR: 67 Stress HR: 169  Rest: BP: 146/96 Stress BP: 218/90  Exercise Time (min): 9:00 METS: 10.10   Predicted Max HR: 170 bpm % Max HR: 99.41 bpm Rate Pressure Product: 21308   Dose of Adenosine (mg):  n/a Dose of Lexiscan: n/a mg  Dose of Atropine (mg): n/a Dose of Dobutamine: n/a mcg/kg/min (at max HR)  Stress Test Technologist: Milana Na, EMT-P  Nuclear Technologist:  Domenic Polite, CNMT     Rest Procedure:  Myocardial perfusion imaging was performed at rest 45 minutes following the intravenous administration of Technetium 14m Tetrofosmin. Rest ECG: NSR - Normal EKG  Stress Procedure:  The patient performed  treadmill exercise using a Bruce  Protocol for 9:00 minutes. The patient stopped due to fatigue and denied any chest pain.  There were no significant ST-T wave changes and occ pvcs.  Technetium 59m Tetrofosmin was injected at peak exercise and myocardial perfusion imaging was performed after a brief delay. Stress ECG: No significant change from baseline ECG  QPS Raw Data Images:  Acquisition technically good; normal left ventricular size. Stress Images:  Normal homogeneous uptake in all areas of the myocardium. Rest Images:  Normal homogeneous uptake in all areas of the myocardium. Subtraction (SDS):  No evidence of ischemia. Transient Ischemic Dilatation (Normal <1.22): 1.09 Lung/Heart Ratio (Normal <0.45):  0.26  Quantitative Gated Spect Images QGS EDV:  118 ml QGS ESV:  48 ml  Impression Exercise Capacity:  Good exercise capacity. BP Response:  Hypertensive blood pressure response. Clinical Symptoms:  No chest pain or dyspnea. ECG Impression:  No significant ST segment change suggestive of ischemia. Comparison with Prior Nuclear Study: No images to compare  Overall Impression:  Normal stress nuclear study.  LV Ejection Fraction: 60%.  LV Wall Motion:  NL LV Function; NL Wall Motion  Magon Croson   Normal study.  Please tell patient.   Dalton [employer]

## 2011-09-16 ENCOUNTER — Telehealth: Payer: Self-pay | Admitting: Cardiology

## 2011-09-16 NOTE — Telephone Encounter (Signed)
Fu msg Pt called back about test results and said you can leave msg with his wife.

## 2011-09-16 NOTE — Progress Notes (Signed)
Pt's wife notified.

## 2011-09-16 NOTE — Progress Notes (Signed)
LMTCB

## 2011-09-16 NOTE — Telephone Encounter (Signed)
Pt's wife given results of recent myoview

## 2011-09-21 ENCOUNTER — Other Ambulatory Visit: Payer: Self-pay | Admitting: *Deleted

## 2011-09-21 MED ORDER — ENALAPRIL MALEATE 5 MG PO TABS: 5.0000 mg | ORAL_TABLET | Freq: Two times a day (BID) | ORAL | Status: DC

## 2011-11-26 ENCOUNTER — Encounter: Payer: Self-pay | Admitting: *Deleted

## 2011-12-15 ENCOUNTER — Encounter: Payer: Self-pay | Admitting: Internal Medicine

## 2011-12-15 ENCOUNTER — Ambulatory Visit (INDEPENDENT_AMBULATORY_CARE_PROVIDER_SITE_OTHER): Payer: BC Managed Care – PPO | Admitting: Internal Medicine

## 2011-12-15 VITALS — BP 116/88 | HR 80 | Ht 72.0 in | Wt 209.4 lb

## 2011-12-15 DIAGNOSIS — K602 Anal fissure, unspecified: Secondary | ICD-10-CM

## 2011-12-15 DIAGNOSIS — K2270 Barrett's esophagus without dysplasia: Secondary | ICD-10-CM

## 2011-12-15 DIAGNOSIS — D689 Coagulation defect, unspecified: Secondary | ICD-10-CM

## 2011-12-15 DIAGNOSIS — Z85048 Personal history of other malignant neoplasm of rectum, rectosigmoid junction, and anus: Secondary | ICD-10-CM

## 2011-12-15 MED ORDER — HYDROCORTISONE ACE-PRAMOXINE 2.5-1 % RE CREA: TOPICAL_CREAM | Freq: Two times a day (BID) | RECTAL | Status: DC

## 2011-12-15 NOTE — Patient Instructions (Addendum)
You have been scheduled for an endoscopy with propofol. Please follow written instructions given to you at your visit today. If you use inhalers (even only as needed), please bring them with you on the day of your procedure. Hold Plavix 5 days prior to your test per Dr Lina Sar We have sent the following medications to your pharmacy for you to pick up at your convenience: Analpram CC: Dr Corrie Mckusick

## 2011-12-15 NOTE — Progress Notes (Signed)
[name] [name] 1961/01/05 MRN [id number]   History of Present Illness:  This is a 51 year old white male with a history of Barrett's esophagus on his last upper endoscopy in March 2011. A prior endoscopy in 2006 did not show Barrett's esophagus but the initial endoscopy in 2001 showed Barrett's esophagus. His symptoms have been controlled with Dexilant 60 mg in the morning. He denies dysphagia or odynophagia. He is due for a recall upper endoscopy. He is on Plavix. 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 He had lung metastases resected in 2000. He has had no recurrence. He has mild diverticulosis and an anal fissure which keeps recurring. He uses topical steroids. There is a history of a hyperplastic polyp of the colon on his last colonoscopy in March 2011. He takes MiraLax for constipation.   Past Medical History  Diagnosis Date  . BARRETT'S ESOPHAGUS, HX OF   . HYPERTENSION   . DYSLIPIDEMIA   . CAD   . SLEEP APNEA   . Secondary malignant neoplasm of lung   . ADENOCARCINOMA, SIGMOID COLON   . HIATAL HERNIA WITH REFLUX   . GERD (gastroesophageal reflux disease)   . Heart disease   . Diverticulosis   . Hyperplastic colon polyp    Past Surgical History  Procedure Date  . Coronary stent placement 2007    taxus  . Colon resection 1998  . Tonsillectomy     reports that he has never smoked. He has never used smokeless tobacco. He reports that he does not drink alcohol or use illicit drugs. family history includes Colon polyps in his mother; Diabetes in his father; and Heart disease in his father and mother.  There is no history of Colon cancer. No Known Allergies      Review of Systems: Negative for heartburn or dysphagia negative for rectal bleeding  The remainder of the 10 point ROS is negative except as outlined in H&P   Physical Exam: General appearance  Well developed, in no distress. Eyes- non icteric. HEENT nontraumatic,  normocephalic. Mouth no lesions, tongue papillated, no cheilosis. Neck supple without adenopathy, thyroid not enlarged, no carotid bruits, no JVD. Lungs Clear to auscultation bilaterally. Cor normal S1, normal S2, regular rhythm, no murmur,  quiet precordium. Abdomen: Soft nontender abdomen with normoactive bowel sounds. Well-healed surgical scar. No distention. No bruit. Rectal: Not done. Extremities no pedal edema. Skin no lesions. Neurological alert and oriented x 3. Psychological normal mood and affect.  Assessment and Plan:  Problem #1 Barrett's esophagus. His symptoms of reflux are controlled with a PPI. We will schedule an upper endoscopy and biopsies. He is to hold Plavix for 5 days prior to the procedure.  Problem #2 Adenocarcinoma of the sigmoid colon in 1998. He is status post resection. His last colonoscopy was in March 2011. His next colonoscopy will be due in March 2016.  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 He will continue MiraLax 9-17 g daily and Analpram cream, apply twice a day.   12/15/2011 Lina Sar

## 2011-12-16 ENCOUNTER — Encounter: Payer: Self-pay | Admitting: Internal Medicine

## 2012-01-20 ENCOUNTER — Ambulatory Visit (AMBULATORY_SURGERY_CENTER): Payer: BC Managed Care – PPO | Admitting: Internal Medicine

## 2012-01-20 ENCOUNTER — Encounter: Payer: Self-pay | Admitting: Internal Medicine

## 2012-01-20 VITALS — BP 147/89 | HR 59 | Temp 97.4°F | Resp 23 | Ht 72.0 in | Wt 209.0 lb

## 2012-01-20 DIAGNOSIS — K2990 Gastroduodenitis, unspecified, without bleeding: Secondary | ICD-10-CM

## 2012-01-20 DIAGNOSIS — K2270 Barrett's esophagus without dysplasia: Secondary | ICD-10-CM

## 2012-01-20 DIAGNOSIS — K2970 Gastritis, unspecified, without bleeding: Secondary | ICD-10-CM

## 2012-01-20 MED ORDER — SODIUM CHLORIDE 0.9 % IV SOLN: 500.0000 mL | INTRAVENOUS | Status: DC

## 2012-01-20 NOTE — Patient Instructions (Addendum)
Handouts were given to your care partner on anti-reflux regimen and barrett's.  Per Dr. [name] you may resume your plavix and continue current medications.  Please call if any questions or concerns.     YOU HAD AN ENDOSCOPIC PROCEDURE TODAY AT THE [facility]: Refer to the procedure report that was given to you for any specific questions about what was found during the examination.  If the procedure report does not answer your questions, please call your gastroenterologist to clarify.  If you requested that your care partner not be given the details of your procedure findings, then the procedure report has been included in a sealed envelope for you to review at your convenience later.  YOU SHOULD EXPECT: Some feelings of bloating in the abdomen. Passage of more gas than usual.  Walking can help get rid of the air that was put into your GI tract during the procedure and reduce the bloating. If you had a lower endoscopy (such as a colonoscopy or flexible sigmoidoscopy) you may notice spotting of blood in your stool or on the toilet paper. If you underwent a bowel prep for your procedure, then you may not have a normal bowel movement for a few days.  DIET: Your first meal following the procedure should be a light meal and then it is ok to progress to your normal diet.  A half-sandwich or bowl of soup is an example of a good first meal.  Heavy or fried foods are harder to digest and may make you feel nauseous or bloated.  Likewise meals heavy in dairy and vegetables can cause extra gas to form and this can also increase the bloating.  Drink plenty of fluids but you should avoid alcoholic beverages for 24 hours.  ACTIVITY: Your care partner should take you home directly after the procedure.  You should plan to take it easy, moving slowly for the rest of the day.  You can resume normal activity the day after the procedure however you should NOT DRIVE or use heavy machinery for 24 hours (because of  the sedation medicines used during the test).    SYMPTOMS TO REPORT IMMEDIATELY: A gastroenterologist can be reached at any hour.  During normal business hours, 8:30 AM to 5:00 PM Monday through Friday, call [phone].  After hours and on weekends, please call the GI answering service at [phone] who will take a message and have the physician on call contact you.     Following upper endoscopy (EGD)  Vomiting of blood or coffee ground material  New chest pain or pain under the shoulder blades  Painful or persistently difficult swallowing  New shortness of breath  Fever of 100F or higher  Black, tarry-looking stools  FOLLOW UP: If any biopsies were taken you will be contacted by phone or by letter within the next 1-3 weeks.  Call your gastroenterologist if you have not heard about the biopsies in 3 weeks.  Our staff will call the home number listed on your records the next business day following your procedure to check on you and address any questions or concerns that you may have at that time regarding the information given to you following your procedure. This is a courtesy call and so if there is no answer at the home number and we have not heard from you through the emergency physician on call, we will assume that you have returned to your regular daily activities without incident.  SIGNATURES/CONFIDENTIALITY: You and/or your care  partner have signed paperwork which will be entered into your electronic medical record.  These signatures attest to the fact that that the information above on your After Visit Summary has been reviewed and is understood.  Full responsibility of the confidentiality of this discharge information lies with you and/or your care-partner.  

## 2012-01-20 NOTE — Op Note (Signed)
[facility] [address] [place], [address]   ENDOSCOPY PROCEDURE REPORT  PATIENT: [name] [name], [name] [name]  MR#: [id number] BIRTHDATE: 21-Jan-1961 , 51  yrs. old GENDER: Male ENDOSCOPIST: Hart Carwin, MD REFERRED BY: Dr Demetrius Charity. 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 MEDICATIONS: Propofol (Diprivan) 290 mg TOPICAL ANESTHETIC: Cetacaine Spray  DESCRIPTION OF PROCEDURE: After the risks benefits and alternatives of the procedure were thoroughly explained, informed consent was obtained.  The LB GIF-H180 [id number] endoscope was introduced through the mouth and advanced to the second portion of the duodenum. Without limitations.  The instrument was slowly withdrawn as the mucosa was fully examined.      ESOPHAGUS: There was evidence of Barrett's esophagus.  Multiple biopsies were performed using cold forceps.  Retroflexed views revealed no abnormalities.     The scope was then withdrawn from the patient and the procedure completed.  COMPLICATIONS: There were no complications. ENDOSCOPIC IMPRESSION: There was evidence of Barrett's esophagus; multiple biopsies  RECOMMENDATIONS: 1.  await biopsy results 2.  anti-reflux regimen to be follow 3.  continue current medications 4.   resume Plavix  REPEAT EXAM: .  2 year recall  eSigned:  Hart Carwin, MD 01/20/2012 8:32 AM   CC:

## 2012-01-20 NOTE — Progress Notes (Signed)
No complaints noted in the recovery room. Maw  Patient did not experience any of the following events: a burn prior to discharge; a fall within the facility; wrong site/side/patient/procedure/implant event; or a hospital transfer or hospital admission upon discharge from the facility. (G8907) Patient did not have preoperative order for IV antibiotic SSI prophylaxis. (G8918)  

## 2012-01-21 ENCOUNTER — Telehealth: Payer: Self-pay

## 2012-01-21 NOTE — Telephone Encounter (Signed)
Unable to leave message. Machine will not allow.

## 2012-01-25 ENCOUNTER — Encounter: Payer: Self-pay | Admitting: Internal Medicine

## 2012-08-22 ENCOUNTER — Encounter (HOSPITAL_COMMUNITY)
Admission: EM | Disposition: A | Discharge status: Home / Self Care | Payer: Self-pay | Source: Home / Self Care | Attending: Cardiology

## 2012-08-22 ENCOUNTER — Emergency Department (HOSPITAL_COMMUNITY): Payer: BC Managed Care – PPO

## 2012-08-22 ENCOUNTER — Encounter (HOSPITAL_COMMUNITY): Payer: Self-pay | Admitting: *Deleted

## 2012-08-22 ENCOUNTER — Inpatient Hospital Stay (HOSPITAL_COMMUNITY)
Admission: EM | Admit: 2012-08-22 | Discharge: 2012-08-23 | DRG: 853 | Disposition: A | Discharge status: Home / Self Care | Payer: BC Managed Care – PPO | Attending: Cardiology | Admitting: Cardiology

## 2012-08-22 DIAGNOSIS — I214 Non-ST elevation (NSTEMI) myocardial infarction: Secondary | ICD-10-CM

## 2012-08-22 DIAGNOSIS — Z955 Presence of coronary angioplasty implant and graft: Secondary | ICD-10-CM

## 2012-08-22 DIAGNOSIS — Z9049 Acquired absence of other specified parts of digestive tract: Secondary | ICD-10-CM

## 2012-08-22 DIAGNOSIS — Z85038 Personal history of other malignant neoplasm of large intestine: Secondary | ICD-10-CM

## 2012-08-22 DIAGNOSIS — Y831 Surgical operation with implant of artificial internal device as the cause of abnormal reaction of the patient, or of later complication, without mention of misadventure at the time of the procedure: Secondary | ICD-10-CM | POA: Diagnosis present

## 2012-08-22 DIAGNOSIS — Z8249 Family history of ischemic heart disease and other diseases of the circulatory system: Secondary | ICD-10-CM

## 2012-08-22 DIAGNOSIS — Z7902 Long term (current) use of antithrombotics/antiplatelets: Secondary | ICD-10-CM

## 2012-08-22 DIAGNOSIS — K219 Gastro-esophageal reflux disease without esophagitis: Secondary | ICD-10-CM | POA: Diagnosis present

## 2012-08-22 DIAGNOSIS — Z8719 Personal history of other diseases of the digestive system: Secondary | ICD-10-CM

## 2012-08-22 DIAGNOSIS — E785 Hyperlipidemia, unspecified: Secondary | ICD-10-CM | POA: Diagnosis present

## 2012-08-22 DIAGNOSIS — G4730 Sleep apnea, unspecified: Secondary | ICD-10-CM | POA: Diagnosis present

## 2012-08-22 DIAGNOSIS — Z9861 Coronary angioplasty status: Secondary | ICD-10-CM

## 2012-08-22 DIAGNOSIS — I200 Unstable angina: Secondary | ICD-10-CM

## 2012-08-22 DIAGNOSIS — Z85118 Personal history of other malignant neoplasm of bronchus and lung: Secondary | ICD-10-CM

## 2012-08-22 DIAGNOSIS — Z902 Acquired absence of lung [part of]: Secondary | ICD-10-CM

## 2012-08-22 DIAGNOSIS — C187 Malignant neoplasm of sigmoid colon: Secondary | ICD-10-CM

## 2012-08-22 DIAGNOSIS — Z9089 Acquired absence of other organs: Secondary | ICD-10-CM

## 2012-08-22 DIAGNOSIS — Z7982 Long term (current) use of aspirin: Secondary | ICD-10-CM

## 2012-08-22 DIAGNOSIS — Z79899 Other long term (current) drug therapy: Secondary | ICD-10-CM

## 2012-08-22 DIAGNOSIS — K449 Diaphragmatic hernia without obstruction or gangrene: Secondary | ICD-10-CM | POA: Diagnosis present

## 2012-08-22 DIAGNOSIS — I10 Essential (primary) hypertension: Secondary | ICD-10-CM | POA: Diagnosis present

## 2012-08-22 DIAGNOSIS — I2510 Atherosclerotic heart disease of native coronary artery without angina pectoris: Secondary | ICD-10-CM

## 2012-08-22 DIAGNOSIS — I249 Acute ischemic heart disease, unspecified: Secondary | ICD-10-CM

## 2012-08-22 DIAGNOSIS — Y92009 Unspecified place in unspecified non-institutional (private) residence as the place of occurrence of the external cause: Secondary | ICD-10-CM

## 2012-08-22 DIAGNOSIS — T82897A Other specified complication of cardiac prosthetic devices, implants and grafts, initial encounter: Secondary | ICD-10-CM | POA: Diagnosis present

## 2012-08-22 DIAGNOSIS — G4733 Obstructive sleep apnea (adult) (pediatric): Secondary | ICD-10-CM | POA: Diagnosis present

## 2012-08-22 DIAGNOSIS — Z9221 Personal history of antineoplastic chemotherapy: Secondary | ICD-10-CM

## 2012-08-22 HISTORY — DX: Angina pectoris, unspecified: I20.9

## 2012-08-22 HISTORY — PX: LEFT HEART CATHETERIZATION WITH CORONARY ANGIOGRAM: SHX5451

## 2012-08-22 LAB — CBC WITH DIFFERENTIAL/PLATELET
Basophils Absolute: 0 10*3/uL (ref 0.0–0.1)
Lymphocytes Relative: 28 % (ref 12–46)
Lymphs Abs: 2.7 10*3/uL (ref 0.7–4.0)
Neutro Abs: 6 10*3/uL (ref 1.7–7.7)
Neutrophils Relative %: 63 % (ref 43–77)
Platelets: 245 10*3/uL (ref 150–400)
RBC: 5.36 MIL/uL (ref 4.22–5.81)
RDW: 12.9 % (ref 11.5–15.5)
WBC: 9.6 10*3/uL (ref 4.0–10.5)

## 2012-08-22 LAB — COMPREHENSIVE METABOLIC PANEL
ALT: 30 U/L (ref 0–53)
AST: 30 U/L (ref 0–37)
Alkaline Phosphatase: 83 U/L (ref 39–117)
CO2: 25 mEq/L (ref 19–32)
Calcium: 10 mg/dL (ref 8.4–10.5)
GFR calc Af Amer: >90 mL/min (ref >90)
GFR calc non Af Amer: >90 mL/min (ref >90)
Glucose, Bld: 126 mg/dL — ABNORMAL HIGH (ref 70–99)
Potassium: 3.8 mEq/L (ref 3.5–5.1)
Sodium: 141 mEq/L (ref 135–145)

## 2012-08-22 LAB — TROPONIN I
Troponin I: 0.39 ng/mL (ref <0.30)
Troponin I: 0.58 ng/mL (ref <0.30)

## 2012-08-22 LAB — HEMOGLOBIN A1C
Hgb A1c MFr Bld: 5.5 % (ref <5.7)
Mean Plasma Glucose: 111 mg/dL (ref <117)

## 2012-08-22 SURGERY — LEFT HEART CATHETERIZATION WITH CORONARY ANGIOGRAM
Anesthesia: LOCAL

## 2012-08-22 MED ORDER — VERAPAMIL HCL 2.5 MG/ML IV SOLN
INTRAVENOUS | Status: AC
  Filled 2012-08-22: qty 2

## 2012-08-22 MED ORDER — ATROPINE SULFATE 1 MG/ML IJ SOLN
INTRAMUSCULAR | Status: AC
  Filled 2012-08-22: qty 1

## 2012-08-22 MED ORDER — SODIUM CHLORIDE 0.9 % IV SOLN: 1.0000 mL/kg/h | INTRAVENOUS | Status: DC

## 2012-08-22 MED ORDER — MIDAZOLAM HCL 2 MG/2ML IJ SOLN
INTRAMUSCULAR | Status: AC
  Filled 2012-08-22: qty 2

## 2012-08-22 MED ORDER — FENTANYL CITRATE 0.05 MG/ML IJ SOLN
INTRAMUSCULAR | Status: AC
  Filled 2012-08-22: qty 2

## 2012-08-22 MED ORDER — ACETAMINOPHEN 325 MG PO TABS: 650.0000 mg | ORAL_TABLET | ORAL | Status: DC | PRN

## 2012-08-22 MED ORDER — LIDOCAINE HCL (PF) 1 % IJ SOLN
INTRAMUSCULAR | Status: AC
  Filled 2012-08-22: qty 30

## 2012-08-22 MED ORDER — ENALAPRIL MALEATE 5 MG PO TABS
5.0000 mg | ORAL_TABLET | Freq: Every day | ORAL | Status: DC
  Administered 2012-08-23: 5 mg via ORAL
  Filled 2012-08-22: qty 1

## 2012-08-22 MED ORDER — NIACIN ER (ANTIHYPERLIPIDEMIC) 500 MG PO TBCR
500.0000 mg | EXTENDED_RELEASE_TABLET | Freq: Every day | ORAL | Status: DC
  Administered 2012-08-22: 500 mg via ORAL
  Filled 2012-08-22 (×2): qty 1

## 2012-08-22 MED ORDER — ONE-DAILY MULTI VITAMINS PO TABS: 1.0000 | ORAL_TABLET | Freq: Every day | ORAL | Status: DC

## 2012-08-22 MED ORDER — CLOPIDOGREL BISULFATE 300 MG PO TABS
ORAL_TABLET | ORAL | Status: AC
  Filled 2012-08-22: qty 2

## 2012-08-22 MED ORDER — NITROGLYCERIN 0.4 MG SL SUBL: 0.4000 mg | SUBLINGUAL_TABLET | SUBLINGUAL | Status: DC | PRN

## 2012-08-22 MED ORDER — BIVALIRUDIN 250 MG IV SOLR
INTRAVENOUS | Status: AC
  Filled 2012-08-22: qty 250

## 2012-08-22 MED ORDER — CLOPIDOGREL BISULFATE 75 MG PO TABS
75.0000 mg | ORAL_TABLET | Freq: Every day | ORAL | Status: DC
  Administered 2012-08-23: 75 mg via ORAL
  Filled 2012-08-22: qty 1

## 2012-08-22 MED ORDER — ASPIRIN 81 MG PO TABS: 81.0000 mg | ORAL_TABLET | Freq: Every day | ORAL | Status: DC

## 2012-08-22 MED ORDER — ASPIRIN 81 MG PO CHEW
81.0000 mg | CHEWABLE_TABLET | Freq: Every day | ORAL | Status: DC
  Administered 2012-08-22 – 2012-08-23 (×2): 81 mg via ORAL
  Filled 2012-08-22: qty 1

## 2012-08-22 MED ORDER — HEPARIN (PORCINE) IN NACL 2-0.9 UNIT/ML-% IJ SOLN
INTRAMUSCULAR | Status: AC
  Filled 2012-08-22: qty 1000

## 2012-08-22 MED ORDER — NITROGLYCERIN 2 % TD OINT
1.0000 [in_us] | TOPICAL_OINTMENT | Freq: Once | TRANSDERMAL | Status: AC
  Administered 2012-08-22: 1 [in_us] via TOPICAL
  Filled 2012-08-22: qty 1

## 2012-08-22 MED ORDER — ASPIRIN EC 81 MG PO TBEC
81.0000 mg | DELAYED_RELEASE_TABLET | Freq: Every day | ORAL | Status: DC
  Filled 2012-08-22: qty 1

## 2012-08-22 MED ORDER — METOPROLOL TARTRATE 12.5 MG HALF TABLET
12.5000 mg | ORAL_TABLET | Freq: Two times a day (BID) | ORAL | Status: DC
  Administered 2012-08-22 – 2012-08-23 (×3): 12.5 mg via ORAL
  Filled 2012-08-22 (×5): qty 1

## 2012-08-22 MED ORDER — HEPARIN (PORCINE) IN NACL 100-0.45 UNIT/ML-% IJ SOLN
1300.0000 [IU]/h | INTRAMUSCULAR | Status: DC
  Administered 2012-08-22: 1300 [IU]/h via INTRAVENOUS
  Filled 2012-08-22 (×2): qty 250

## 2012-08-22 MED ORDER — ATORVASTATIN CALCIUM 80 MG PO TABS
80.0000 mg | ORAL_TABLET | Freq: Every day | ORAL | Status: DC
  Administered 2012-08-22: 80 mg via ORAL
  Filled 2012-08-22 (×2): qty 1

## 2012-08-22 MED ORDER — PANTOPRAZOLE SODIUM 40 MG PO TBEC
40.0000 mg | DELAYED_RELEASE_TABLET | Freq: Every day | ORAL | Status: DC
  Administered 2012-08-23: 40 mg via ORAL
  Filled 2012-08-22: qty 1

## 2012-08-22 MED ORDER — ONDANSETRON HCL 4 MG/2ML IJ SOLN: 4.0000 mg | Freq: Four times a day (QID) | INTRAMUSCULAR | Status: DC | PRN

## 2012-08-22 MED ORDER — NITROGLYCERIN 1 MG/10 ML FOR IR/CATH LAB
INTRA_ARTERIAL | Status: AC
  Filled 2012-08-22: qty 10

## 2012-08-22 MED ORDER — HEPARIN BOLUS VIA INFUSION
4000.0000 [IU] | Freq: Once | INTRAVENOUS | Status: AC
  Administered 2012-08-22: 4000 [IU] via INTRAVENOUS

## 2012-08-22 MED ORDER — AMLODIPINE BESYLATE 5 MG PO TABS
5.0000 mg | ORAL_TABLET | Freq: Two times a day (BID) | ORAL | Status: DC
  Administered 2012-08-22 – 2012-08-23 (×2): 5 mg via ORAL
  Filled 2012-08-22 (×4): qty 1

## 2012-08-22 MED ORDER — CLOPIDOGREL BISULFATE 75 MG PO TABS: 75.0000 mg | ORAL_TABLET | Freq: Every day | ORAL | Status: DC

## 2012-08-22 MED ORDER — OMEGA-3-ACID ETHYL ESTERS 1 G PO CAPS
2.0000 g | ORAL_CAPSULE | Freq: Every day | ORAL | Status: DC
  Administered 2012-08-23: 2 g via ORAL
  Filled 2012-08-22: qty 2

## 2012-08-22 MED ORDER — SODIUM CHLORIDE 0.9 % IV SOLN
1.0000 mL/kg/h | INTRAVENOUS | Status: AC
  Administered 2012-08-22: 1 mL/kg/h via INTRAVENOUS

## 2012-08-22 MED ORDER — ADULT MULTIVITAMIN W/MINERALS CH
1.0000 | ORAL_TABLET | Freq: Every day | ORAL | Status: DC
  Administered 2012-08-23: 1 via ORAL
  Filled 2012-08-22: qty 1

## 2012-08-22 MED ORDER — ALPRAZOLAM 0.25 MG PO TABS: 0.2500 mg | ORAL_TABLET | Freq: Two times a day (BID) | ORAL | Status: DC | PRN

## 2012-08-22 NOTE — H&P (Signed)
Patient ID: [name] [name] MRN: [id number], DOB/AGE: [birth year]   Admit date: 08/22/2012  Primary Physician: Elise Benne, MD Primary Cardiologist: Golden Circle, MD  Pt. Profile:  52 y/o male with h/o CAD who presented to ED with chest pain.  Problem List  Past Medical History  Diagnosis Date  . BARRETT'S ESOPHAGUS, HX OF   . HYPERTENSION   . DYSLIPIDEMIA   . CAD     a. 2007: DES to LAD and Diag;  b. 08/2011 non-ischemic myoview, EF 60%.  Marland Kitchen SLEEP APNEA   . 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 s/p wedge resection  . ADENOCARCINOMA, SIGMOID COLON     a. s/p resection/chemo  . HIATAL HERNIA WITH REFLUX   . GERD (gastroesophageal reflux disease)   . Diverticulosis   . Hyperplastic colon polyp   . Allergy     SEASONAL    Past Surgical History  Procedure Laterality Date  . Coronary stent placement  2007    taxus  . Colon resection  1998  . Tonsillectomy    . Lung removal, partial      LEFT  . Knee surgery      Allergies  No Known Allergies  HPI  52 y/o male with h/o CAD s/p PCI of the LAD and Diag in 2007 with negative myoview in 08/2011.  That was performed secondary to discomfort occurring under his left shoulder blade.  He had done well following that however 1 week ago, while lying in bed, he developed lightheadedness and discomfort under his right shoulder blade.  He had no associated Ss.  Discomfort lasted about an hour and resolved spontaneously.  He worked all week last week w/o recurrence, but on Saturday, in the process of working on his chainsaw after cutting some wood, he developed recurrent lightheadedness and right scapular discomfort.  This was similar to his prior angina in 2007 and again was w/o associated Ss.  He checked his BP and noted it to be elevated @ 179/117, for which he took an additional amlodipine.  He also noted that his HR was elevated in the 1-teens but believes that he was just nervous.  After about 6 hrs, the discomfort  resolved completely.  He did go back to cutting wood about an hour prior to resolution of Ss and activity did not seem to make Ss worse.  This AM, he was @ work @ 6:20, and noted recurrent LH/R scapular discomfort.  He chewed aspirin and then drove into ED.  Ss have been intermittent since, though he is currently pain free.   His initial troponin is mildly elevated @ 0.33.  Home Medications  Prior to Admission medications   Medication Sig Start Date End Date Taking? Authorizing Provider  amLODipine (NORVASC) 5 MG tablet Take 5 mg by mouth 2 (two) times daily.    Yes Historical Provider, MD  aspirin 325 MG tablet Take 650 mg by mouth once.   Yes Historical Provider, MD  aspirin 81 MG tablet Take 81 mg by mouth daily.   Yes Historical Provider, MD  CELEBREX 200 MG capsule  11/24/11  Yes Historical Provider, MD  Cholecalciferol (VITAMIN D3) 2000 UNITS TABS Take by mouth daily.     Yes Historical Provider, MD  clopidogrel (PLAVIX) 75 MG tablet Take 75 mg by mouth daily.     Yes Historical Provider, MD  DEXILANT 60 MG capsule Take 60 mg by mouth daily.  07/19/11  Yes Historical Provider, MD  enalapril (  VASOTEC) 5 MG tablet Take 5 mg by mouth daily. 09/21/11 09/20/12 Yes Laurey Morale, MD  Multiple Vitamin (MULTIVITAMIN) tablet Take 1 tablet by mouth daily.     Yes Historical Provider, MD  niacin (NIASPAN) 500 MG CR tablet Take 500 mg by mouth at bedtime.     Yes Historical Provider, MD  omega-3 acid ethyl esters (LOVAZA) 1 G capsule Take 2 g by mouth daily.     Yes Historical Provider, MD  rosuvastatin (CRESTOR) 20 MG tablet Take 20 mg by mouth daily.     Yes Historical Provider, MD   Family History  Family History  Problem Relation Age of Onset  . Heart disease Mother     alive @ 9.  s/p cabg  . Colon polyps Mother   . Heart disease Father     alive @ 55.  s/p stenting  . Colon cancer Neg Hx   . Heart attack Paternal Uncle     @ 31  . Heart attack Paternal Uncle     @ 36   Social  History  History   Social History  . Marital Status: Married    Spouse Name: N/A    Number of Children: N/A  . Years of Education: N/A   Occupational History  . Not on file.   Social History Main Topics  . Smoking status: Never Smoker   . Smokeless tobacco: Never Used  . Alcohol Use: No     Comment: occas has 4 oz of red wine.  . Drug Use: No  . Sexually Active: Yes   Other Topics Concern  . Not on file   Social History Narrative   Lives in [place], [place] with his wife and son.  Works for [employer] in [employer].    Review of Systems General:  No chills, fever, night sweats or weight changes.  Cardiovascular:  +++ chest pain assoc with LH.  No dyspnea on exertion, edema, orthopnea, palpitations, paroxysmal nocturnal dyspnea. Dermatological: No rash, lesions/masses Respiratory: No cough, dyspnea Urologic: No hematuria, dysuria Abdominal:   No nausea, vomiting, diarrhea, bright red blood per rectum, melena, or hematemesis Neurologic:  No visual changes, wkns, changes in mental status. All other systems reviewed and are otherwise negative except as noted above.  Physical Exam  Blood pressure 145/97, pulse 98, resp. rate 14, height 6' (1.829 m), weight 205 lb (92.987 kg), SpO2 100.00%.  General: Pleasant, NAD Psych: Normal affect. Neuro: Alert and oriented X 3. Moves all extremities spontaneously. HEENT: Normal  Neck: Supple without bruits or JVD. Lungs:  Resp regular and unlabored, CTA. Heart: RRR no s3, s4, or murmurs. Abdomen: Soft, non-tender, non-distended, BS + x 4.  Extremities: No clubbing, cyanosis or edema. DP/PT/Radials 2+ and equal bilaterally.  Labs   Recent Labs  08/22/12 0713  TROPONINI 0.33*   Lab Results  Component Value Date   WBC 9.6 08/22/2012   HGB 16.7 08/22/2012   HCT 46.8 08/22/2012   MCV 87.3 08/22/2012   PLT 245 08/22/2012     Recent Labs Lab 08/22/12 0712  NA 141  K 3.8  CL 104  CO2 25  BUN 18  CREATININE 0.71  CALCIUM 10.0  PROT  7.9  BILITOT 0.4  ALKPHOS 83  ALT 30  AST 30  GLUCOSE 126*   Lab Results  Component Value Date   CHOL 126 09/14/2011   HDL 37.80* 09/14/2011   LDLCALC 79 09/14/2011   TRIG 45.0 09/14/2011   Radiology/Studies  Dg Chest 2  View  08/22/2012  *RADIOLOGY REPORT*  Clinical Data: Chest pain with dizziness.  History of hypertension.  CHEST - 2 VIEW  Comparison: 10/21/2008.  Findings: The heart size and mediastinal contours are stable. There is linear left lower lobe scarring which appears stable.  The right lung is clear.  There is no pleural effusion or pneumothorax. The osseous structures appear unchanged.  IMPRESSION: Stable left lower lobe scarring.  No acute cardiopulmonary process.   Original Report Authenticated By: Carey Bullocks, M.D.    ECG  Sinus tach, 105, no acute st/t changes.   ASSESSMENT AND PLAN  1.  NSTEMI/CAD:  Pt presents with a 1 week h/o intermittent lightheadedness and right scapular discomfort similar to prior angina in 2007.  His initial troponin is mildly elevated @ 0.33.  He is currently pain free but has had intermittent Ss here in the ED.  Will plan to admit, cycle CE, add heparin, and bb, cont asa, plavix, statin.  Plan cath this admission, possibly this afternoon.  2.  HTN:  Currently stable.  Follow.  3.  HL:  Cont statin Rx.  LDL 79 in 08/2011.  F/U.  Signed, Nicolasa Ducking, NP 08/22/2012, 10:01 AM   Attending Note:   The patient was seen and examined.  Agree with assessment and plan as noted above.  Changes made to the above note as needed.  Samson presents with right scapular pain - similar to his previous epidose of angina for which he had a stent placed.   He has had intermattant CP for 1 week - worsened this past weekend with some chain sawing.   He came to the ER and is found to have a positive Troponin of 0.33.    Exam is unremarkable  ECG : sinus tach, no acute ST changes  Imp:  I think it is reasonable to go ahead and cath Mr. Badeaux today.   He has his angina equivilent and has + Troponin levels.    IV heparin.  Keep NPO for cath later today.   Vesta Mixer, Montez Hageman., MD, [facility] 08/22/2012, 10:28 AM

## 2012-08-22 NOTE — Interval H&P Note (Signed)
History and Physical Interval Note:  08/22/2012 11:21 AM  [name] [name]  has presented today for surgery, with the diagnosis of cp  The various methods of treatment have been discussed with the patient and family. After consideration of risks, benefits and other options for treatment, the patient has consented to  Procedure(s): LEFT HEART CATHETERIZATION WITH CORONARY ANGIOGRAM (N/A) as a surgical intervention .  The patient's history has been reviewed, patient examined, no change in status, stable for surgery.  I have reviewed the patient's chart and labs.  Questions were answered to the patient's satisfaction.     Theron Arista [facility] 08/22/2012 11:21 AM

## 2012-08-22 NOTE — ED Provider Notes (Signed)
History     CSN: [id number]  Arrival date & time 08/22/12  4098   First MD Initiated Contact with Patient 08/22/12 0703      Chief Complaint  Patient presents with  . Chest Pain  . Dizziness    (Consider location/radiation/quality/duration/timing/severity/associated sxs/prior treatment) Patient is a 52 y.o. male presenting with chest pain. The history is provided by the patient.  Chest Pain He had an episode of tightness in the right anterior chest 2 days ago. This occurred while he was using a chainsaw. He checked his heart rate and blood pressure noted this artery was 119 and blood pressure was 100 7415. Symptoms lasted about 40 minutes before resolving. He relates that when his heart rate went down, he felt better. Pain was relatively mild and he rated at 1/10. There is some radiation of a tight feeling into his scapular area. He was well yesterday but then this morning had recurrence of a tight feeling in the right side of his chest with radiation to the right scapular area. Tight feeling is again rated at 1/10. His chest pressure has completely resolved and only last about 5 minutes, but he has persistent pressure feeling in the scapular area. Nothing makes it better nothing makes it worse. At this time, there is associated dizziness and lightheadedness. He denies any dyspnea, nausea, diaphoresis. He does have history of cardiac stents. He did shoot to 125 mg aspirin tablets this morning.  Past Medical History  Diagnosis Date  . BARRETT'S ESOPHAGUS, HX OF   . HYPERTENSION   . DYSLIPIDEMIA   . CAD   . SLEEP APNEA   . 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 ADENOCARCINOMA, SIGMOID COLON   . HIATAL HERNIA WITH REFLUX   . GERD (gastroesophageal reflux disease)   . Heart disease   . Diverticulosis   . Hyperplastic colon polyp   . Allergy     SEASONAL    Past Surgical History  Procedure Laterality Date  . Coronary stent placement  2007    taxus  . Colon resection  1998   . Tonsillectomy    . Lung removal, partial      LEFT  . Knee surgery      Family History  Problem Relation Age of Onset  . Heart disease Mother   . Colon polyps Mother   . Heart disease Father   . Colon cancer Neg Hx     History  Substance Use Topics  . Smoking status: Never Smoker   . Smokeless tobacco: Never Used  . Alcohol Use: No      Review of Systems  Cardiovascular: Positive for chest pain.  All other systems reviewed and are negative.    Allergies  Review of patient's allergies indicates no known allergies.  Home Medications   Current Outpatient Rx  Name  Route  Sig  Dispense  Refill  . amLODipine (NORVASC) 5 MG tablet   Oral   Take 5 mg by mouth 2 (two) times daily.          Marland Kitchen aspirin 81 MG tablet   Oral   Take 81 mg by mouth daily.         . CELEBREX 200 MG capsule               . Cholecalciferol (VITAMIN D3) 2000 UNITS TABS   Oral   Take by mouth daily.           . clopidogrel (PLAVIX) 75 MG tablet  Oral   Take 75 mg by mouth daily.           Marland Kitchen DEXILANT 60 MG capsule   Oral   Take 60 mg by mouth daily.          . enalapril (VASOTEC) 5 MG tablet   Oral   Take 1 tablet (5 mg total) by mouth 2 (two) times daily.   180 tablet   3   . fexofenadine (ALLEGRA) 180 MG tablet   Oral   Take 180 mg by mouth as needed.          . hydrocortisone-pramoxine (ANALPRAM-HC) 2.5-1 % rectal cream   Rectal   Place rectally 2 (two) times daily.   180 g   0   . loratadine (CLARITIN) 10 MG tablet   Oral   Take 10 mg by mouth as needed.          . Multiple Vitamin (MULTIVITAMIN) tablet   Oral   Take 1 tablet by mouth daily.           Marland Kitchen NASONEX 50 MCG/ACT nasal spray   Nasal   Place into the nose daily.          . niacin (NIASPAN) 500 MG CR tablet   Oral   Take 500 mg by mouth at bedtime.           Marland Kitchen omega-3 acid ethyl esters (LOVAZA) 1 G capsule   Oral   Take 2 g by mouth daily.           . rosuvastatin  (CRESTOR) 20 MG tablet   Oral   Take 20 mg by mouth daily.             BP 171/101  Pulse 98  Resp 16  SpO2 100%  Physical Exam  Nursing note and vitals reviewed.  52 year old male, resting comfortably and in no acute distress. Vital signs are significant for hypertension with blood pressure 171/101. Oxygen saturation is 100%, which is normal. Head is normocephalic and atraumatic. PERRLA, EOMI. Oropharynx is clear. Neck is nontender and supple without adenopathy or JVD. Back is nontender and there is no CVA tenderness. Lungs are clear without rales, wheezes, or rhonchi. Chest is nontender. Heart has regular rate and rhythm without murmur. Abdomen is soft, flat, nontender without masses or hepatosplenomegaly and peristalsis is normoactive. Extremities have no trace edema, full range of motion is present. Skin is warm and dry without rash. Neurologic: Mental status is normal, cranial nerves are intact, there are no motor or sensory deficits.\  ED Course  Procedures (including critical care time)  Results for orders placed during the hospital encounter of 08/22/12  CBC WITH DIFFERENTIAL      Result Value Range   WBC 9.6  4.0 - 10.5 K/uL   RBC 5.36  4.22 - 5.81 MIL/uL   Hemoglobin 16.7  13.0 - 17.0 g/dL   HCT 91.4  78.2 - 95.6 %   MCV 87.3  78.0 - 100.0 fL   MCH 31.2  26.0 - 34.0 pg   MCHC 35.7  30.0 - 36.0 g/dL   RDW 21.3  08.6 - 57.8 %   Platelets 245  150 - 400 K/uL   Neutrophils Relative 63  43 - 77 %   Neutro Abs 6.0  1.7 - 7.7 K/uL   Lymphocytes Relative 28  12 - 46 %   Lymphs Abs 2.7  0.7 - 4.0 K/uL   Monocytes Relative 6  3 -  12 %   Monocytes Absolute 0.6  0.1 - 1.0 K/uL   Eosinophils Relative 3  0 - 5 %   Eosinophils Absolute 0.3  0.0 - 0.7 K/uL   Basophils Relative 0  0 - 1 %   Basophils Absolute 0.0  0.0 - 0.1 K/uL  COMPREHENSIVE METABOLIC PANEL      Result Value Range   Sodium 141  135 - 145 mEq/L   Potassium 3.8  3.5 - 5.1 mEq/L   Chloride 104  96 -  112 mEq/L   CO2 25  19 - 32 mEq/L   Glucose, Bld 126 (*) 70 - 99 mg/dL   BUN 18  6 - 23 mg/dL   Creatinine, Ser 1.61  0.50 - 1.35 mg/dL   Calcium 09.6  8.4 - 04.5 mg/dL   Total Protein 7.9  6.0 - 8.3 g/dL   Albumin 4.4  3.5 - 5.2 g/dL   AST 30  0 - 37 U/L   ALT 30  0 - 53 U/L   Alkaline Phosphatase 83  39 - 117 U/L   Total Bilirubin 0.4  0.3 - 1.2 mg/dL   GFR calc non Af Amer >90  >90 mL/min   GFR calc Af Amer >90  >90 mL/min  TROPONIN I      Result Value Range   Troponin I 0.33 (*) <0.30 ng/mL   Dg Chest 2 View  08/22/2012  *RADIOLOGY REPORT*  Clinical Data: Chest pain with dizziness.  History of hypertension.  CHEST - 2 VIEW  Comparison: 10/21/2008.  Findings: The heart size and mediastinal contours are stable. There is linear left lower lobe scarring which appears stable.  The right lung is clear.  There is no pleural effusion or pneumothorax. The osseous structures appear unchanged.  IMPRESSION: Stable left lower lobe scarring.  No acute cardiopulmonary process.   Original Report Authenticated By: Carey Bullocks, M.D.      ECG shows normal sinus tachycardia with a rate of 105, no ectopy. Normal axis. Normal P wave. Normal QRS. Normal intervals. Normal ST and T waves. Impression: Sinus tachycardia, otherwise normal ECG. When compared with ECG of 08/31/2011, no significant changes are seen.   1. Acute coronary syndrome    CRITICAL CARE Performed by: WUJWJ,XBJYN   Total critical care time: 35 minutes  Critical care time was exclusive of separately billable procedures and treating other patients.  Critical care was necessary to treat or prevent imminent or life-threatening deterioration.  Critical care was time spent personally by me on the following activities: development of treatment plan with patient and/or surrogate as well as nursing, discussions with consultants, evaluation of patient's response to treatment, examination of patient, obtaining history from patient or  surrogate, ordering and performing treatments and interventions, ordering and review of laboratory studies, ordering and review of radiographic studies, pulse oximetry and re-evaluation of patient's condition.    MDM  Chest pain worrisome for cardiac cause. Old records have been reviewed and he has been followed in [facility] cardiology clinic following 2 coronary stents being placed. He had a stress Myoview 1 year ago which was normal. He wi'll be given nitroglycerin to assess response.  8:51 AM He is completely pain-free after nitroglycerin. Troponin is comes back mildly elevated so he'll be treated as an acute coronary syndrome. You started on heparin and nitroglycerin ointment and cardiology has been consulted.      Dione Booze, MD 08/22/12 [phone]

## 2012-08-22 NOTE — ED Notes (Signed)
CARDIOLOGY AT BEDSIDE

## 2012-08-22 NOTE — ED Notes (Signed)
Patient transported to X-ray 

## 2012-08-22 NOTE — ED Notes (Signed)
Pt reports taking two full strength asa prior to arrival

## 2012-08-22 NOTE — Progress Notes (Signed)
Right femoral sheath pulled with 2 RN's present. Manual pressure held for 20 minutes.  Site is level 0. Pedal pulses +2.  Patient education complete, patient verbalizes understanding of education.

## 2012-08-22 NOTE — ED Notes (Signed)
MD aware of elevated troponin at .33

## 2012-08-22 NOTE — Progress Notes (Signed)
ANTICOAGULATION CONSULT NOTE - Initial Consult  Pharmacy Consult:  Heparin Indication:  ACS  No Known Allergies  Patient Measurements: Height: 6' (182.9 cm) Weight: 205 lb (92.987 kg) IBW/kg (Calculated) : 77.6 Heparin Dosing Weight: 93 kg  Vital Signs: BP: 154/98 mmHg (04/07 0845) Pulse Rate: 93 (04/07 0845)  Labs:  Recent Labs  08/22/12 0712 08/22/12 0713  HGB 16.7  --   HCT 46.8  --   PLT 245  --   CREATININE 0.71  --   TROPONINI  --  0.33*    Estimated Creatinine Clearance: 119.9 ml/min (by C-G formula based on Cr of 0.71).   Medical History: Past Medical History  Diagnosis Date  . BARRETT'S ESOPHAGUS, HX OF   . HYPERTENSION   . DYSLIPIDEMIA   . CAD   . SLEEP APNEA   . 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 ADENOCARCINOMA, SIGMOID COLON   . HIATAL HERNIA WITH REFLUX   . GERD (gastroesophageal reflux disease)   . Heart disease   . Diverticulosis   . Hyperplastic colon polyp   . Allergy     SEASONAL          Assessment: 41 YOM admitted with chest pain to start IV heparin for ACS.  He is not on any anticoagulation PTA.  Baseline CBC WNL.   Goal of Therapy:  Heparin level 0.3-0.7 units/ml Monitor platelets by anticoagulation protocol: Yes    Plan:  - Heparin 4000 units IV bolus, then - Heparin gtt at 1300 units/hr - Check 6 hr HL - Daily HL / CBC      Haedyn Ancrum D. Laney Potash, PharmD, BCPS Pager:  [phone] 08/22/2012, 9:09 AM

## 2012-08-22 NOTE — CV Procedure (Signed)
Cardiac Catheterization Procedure Note  Name: [name] [name] MRN: [id number] DOB: 12-Dec-1960  Procedure: Left Heart Cath, Selective Coronary Angiography, LV angiography,  PTCA/Stent of the PLOM of the RCA, PTCA of the diagonal.  Indication: 52 year old white male with history of coronary disease status post stenting of the mid LAD and the second diagonal branch 2007. He presents with a non-ST elevation myocardial infarction.   Diagnostic Procedure Details: We initially attempted access via the right radial artery. Despite getting good arterial flow with needlestick on several occasions we were never able to advance the wire into the radial artery. We proceeded with right femoral access. The right groin was prepped, draped, and anesthetized with 1% lidocaine. Using the modified Seldinger technique, a 5 French sheath was introduced into the right femoral artery. Standard Judkins catheters were used for selective coronary angiography and left ventriculography. Catheter exchanges were performed over a wire.  The diagnostic procedure was well-tolerated without immediate complications.  PROCEDURAL FINDINGS Hemodynamics: AO 142/88 with a mean of 112 mmHg LV 143/9 mmHg  Coronary angiography: Coronary dominance: right  Left mainstem: The left main coronary is normal.  Left anterior descending (LAD): The left anterior descending artery gives rise to 2 diagonal branches. There is a stent in the mid LAD immediately after the second diagonal. The stent is widely patent. The first diagonal is very small in caliber and has an 80% stenosis at its origin. The second diagonal is large and has 90% in-stent restenosis in the proximal vessel. This involves the proximal one half of the stent.  Left circumflex (LCx): The left circumflex gives rise to several small marginal branches. It is normal.  Right coronary artery (RCA): The right coronary is a large dominant vessel. It has mild plaque in the mid and  distal vessel up to 20%. The posterior lateral branch has a 95% focal stenosis proximally.  Left ventriculography: Left ventricular systolic function is normal, LVEF is estimated at 55-65%, there is no significant mitral regurgitation   PCI Procedure Note:  Following the diagnostic procedure, the decision was made to proceed with PCI. The sheath was upsized to a 6 [place]. Weight-based bivalirudin was given for anticoagulation. The patient was loaded with 600 mg of Plavix by mouth. Once a therapeutic ACT was achieved, a 6 [place] CLS 3.5 guide catheter was inserted.  A pro-water coronary guidewire was used to cross the lesion in the diagonal.  The lesion was dilated with a 2.5 x 10 mm angiosculpt balloon dilating up to 20 atmospheres. This yielded an excellent angiographic result with 0% residual stenosis and TIMI grade 3 flow.   We next proceeded with intervention of the posterior lateral branch of the right coronary. We switched to a JR 4 guide. A whisper wire was used to cross the lesion. The lesion was predilated with a 2.25 mm compliant balloon The lesion was then stented with a 2.25 x 12 mm Promus premier stent dilating up to 14 atmospheres with the stent balloon.  Following PCI, there was 0% residual stenosis and TIMI-3 flow. Final angiography confirmed an excellent result. Femoral hemostasis was achieved with manual compression.  The patient tolerated the PCI procedure well. There were no immediate procedural complications.  The patient was transferred to the post catheterization recovery area for further monitoring.  PCI Data: Vessel #1- Second diagonal/Segment - proximal Percent Stenosis (pre)  90% TIMI-flow 3 Angioplasty with an Angiosculpt balloon Percent Stenosis (post) 0% TIMI-flow (post) 3  Vessel #2-PLOM/proximal Percent stenoses (pre-) 95% TIMI flow 3  Stent: 2.25 x 12 mm Promus premier Percent stenoses (post) 0% TIMI flow (post) 3  Final Conclusions:   1. 2 vessel obstructive  coronary disease. There is severe in-stent restenosis in the second diagonal branch. There is a de novo lesion in the posterior lateral branch of the right coronary. The stent in the mid LAD is widely patent. 2. Normal left ventricular function. 3. Successful angioplasty with an Angiosculpt balloon in the second diagonal and successful stenting of the PLO M. with a drug-eluting stent.  Recommendations: Continue dual antiplatelet therapy for at least one year.  Theron Arista [facility] 08/22/2012, 12:42 PM

## 2012-08-22 NOTE — ED Notes (Signed)
Pt states he was working in his yard on Saturday and noticed his BP was elevated.  Pt reports some chest pain then but stated the pain got better yesterdat, but returned this morning with some lightheadedness.

## 2012-08-23 ENCOUNTER — Encounter (HOSPITAL_COMMUNITY): Payer: Self-pay | Admitting: Nurse Practitioner

## 2012-08-23 DIAGNOSIS — I214 Non-ST elevation (NSTEMI) myocardial infarction: Secondary | ICD-10-CM

## 2012-08-23 DIAGNOSIS — E785 Hyperlipidemia, unspecified: Secondary | ICD-10-CM

## 2012-08-23 DIAGNOSIS — I10 Essential (primary) hypertension: Secondary | ICD-10-CM

## 2012-08-23 LAB — BASIC METABOLIC PANEL
BUN: 15 mg/dL (ref 6–23)
Calcium: 9.4 mg/dL (ref 8.4–10.5)
Creatinine, Ser: 0.76 mg/dL (ref 0.50–1.35)
GFR calc Af Amer: >90 mL/min (ref >90)
GFR calc non Af Amer: >90 mL/min (ref >90)
Glucose, Bld: 99 mg/dL (ref 70–99)

## 2012-08-23 LAB — CBC
HCT: 43.9 % (ref 39.0–52.0)
Hemoglobin: 15.3 g/dL (ref 13.0–17.0)
MCH: 30.4 pg (ref 26.0–34.0)
MCHC: 34.9 g/dL (ref 30.0–36.0)
RDW: 12.8 % (ref 11.5–15.5)

## 2012-08-23 LAB — LIPID PANEL: LDL Cholesterol: 85 mg/dL (ref 0–99)

## 2012-08-23 LAB — TROPONIN I: Troponin I: 0.65 ng/mL (ref <0.30)

## 2012-08-23 MED ORDER — NITROGLYCERIN 0.4 MG SL SUBL: 0.4000 mg | SUBLINGUAL_TABLET | SUBLINGUAL | Status: DC | PRN

## 2012-08-23 MED ORDER — CLOPIDOGREL BISULFATE 75 MG PO TABS: 75.0000 mg | ORAL_TABLET | Freq: Every day | ORAL | Status: AC

## 2012-08-23 MED ORDER — ENALAPRIL MALEATE 5 MG PO TABS: 5.0000 mg | ORAL_TABLET | Freq: Two times a day (BID) | ORAL | Status: DC

## 2012-08-23 MED ORDER — METOPROLOL TARTRATE 25 MG PO TABS: 12.5000 mg | ORAL_TABLET | Freq: Two times a day (BID) | ORAL | Status: DC

## 2012-08-23 MED ORDER — CLOPIDOGREL BISULFATE 75 MG PO TABS: 75.0000 mg | ORAL_TABLET | Freq: Every day | ORAL | Status: DC

## 2012-08-23 NOTE — Progress Notes (Signed)
[id number] Education completed with pt and wife. Understanding voiced. Permission given to refer to [place] CRP 2 since pt works in [employer]. Encouraged pt to over do since he has had a MI. Did not walk with pt as he had walked twice this a.m. more than I would have gone with him. Exercise ed given so patient does not do too much.  No CP with his walks this morning.Colon Flattery BSN

## 2012-08-23 NOTE — Progress Notes (Signed)
Patient discharged to home with wife. Education complete.

## 2012-08-23 NOTE — Discharge Summary (Signed)
Patient ID: [name] [name],  MRN: [id number], DOB/AGE: 1960-09-09 52 y.o.  Admit date: 08/22/2012 Discharge date: 08/23/2012  Primary Care Provider: Elise Benne Primary Cardiologist: Golden Circle, MD  Discharge Diagnoses Principal Problem:   NSTEMI (non-ST elevated myocardial infarction)  **s/p PTCA of in-stent restenosis in the D2 and DES placement within the RPL. Active Problems:   CAD   HYPERTENSION   DYSLIPIDEMIA   HIATAL HERNIA WITH REFLUX   SLEEP APNEA   Allergies No Known Allergies  Procedures  Cardiac Catheterization and Percutaneous Coronary Intervention 08/22/2012  PROCEDURAL FINDINGS Hemodynamics: AO 142/88 with a mean of 112 mmHg LV 143/9 mmHg  Coronary angiography: Coronary dominance: right  Left mainstem: The left main coronary is normal. Left anterior descending (LAD): The left anterior descending artery gives rise to 2 diagonal branches. There is a stent in the mid LAD immediately after the second diagonal. The stent is widely patent. The first diagonal is very small in caliber and has an 80% stenosis at its origin. The second diagonal is large and has 90% in-stent restenosis in the proximal vessel. This involves the proximal one half of the stent.   **The D2 was successfully treated with PTCA.**  Left circumflex (LCx): The left circumflex gives rise to several small marginal branches. It is normal. Right coronary artery (RCA): The right coronary is a large dominant vessel. It has mild plaque in the mid and distal vessel up to 20%. The posterior lateral branch has a 95% focal stenosis proximally.   **The RPL was successfully treated with a 2.25 x 12 mm Promus Premier DES.**   Left ventriculography: Left ventricular systolic function is normal, LVEF is estimated at 55-65%, there is no significant mitral regurgitation   History of Present Illness  52 year old male with prior history of coronary artery disease status post percutaneous interventions to the  LAD and diagonal in 2007. He was in his usual state of health until approximately one week prior to admission when he had an episode of lightheadedness with discomfort occurring under his right shoulder blade, similar to prior angina. That discomfort resolved spontaneously after about an hour and he had no recurrence of discomfort until about 5 days later when he was working with his chainsaw and noted recurrent lightheadedness and right scapular pain. That episode lasted several hours prior to resolving spontaneously. He had a third episode on the morning of admission prompting him to present to the [facility]. There, his troponin was mildly elevated at 0.33 an ECG was nonacute. He was admitted for further evaluation and management of non-ST segment elevation myocardial infarction.  Hospital Course  Following admission, he was placed on heparin and nitroglycerin paste as well as low-dose beta blocker therapy. He was maintained on prior doses of aspirin and Plavix. He eventually peaked his troponin at 0.65. He underwent diagnostic catheterization on the afternoon of April 7 and this revealed 90% in-stent restenosis within the previously placed the second diagonal stent as well as a new 95% focal stenosis within the right posterolateral branch. The second diagonal was successfully treated with angioplasty while the right posterolateral was treated with drug-eluting stent. Patient tolerated procedure well and post procedure has been ambulatory without recurrent symptoms or limitations. He has been seen by cardiac rehabilitation as well. He'll be discharged home today in good condition.  Discharge Vitals Blood pressure 160/95, pulse 101, temperature 97.5 F (36.4 C), temperature source Oral, resp. rate 21, height 6' (1.829 m), weight 201 lb 6.4 oz (91.354 kg), SpO2 99.00%.  Filed Weights   08/22/12 0900 08/22/12 1345 08/23/12 0343  Weight: 205 lb (92.987 kg) 203 lb 4.2 oz (92.2 kg) 201 lb 6.4 oz (91.354 kg)    Labs  CBC  Recent Labs  08/22/12 0712 08/23/12 0218  WBC 9.6 10.2  NEUTROABS 6.0  --   HGB 16.7 15.3  HCT 46.8 43.9  MCV 87.3 87.1  PLT 245 197   Basic Metabolic Panel  Recent Labs  08/22/12 0712 08/23/12 0218  NA 141 138  K 3.8 4.1  CL 104 103  CO2 25 28  GLUCOSE 126* 99  BUN 18 15  CREATININE 0.71 0.76  CALCIUM 10.0 9.4   Liver Function Tests  Recent Labs  08/22/12 0712  AST 30  ALT 30  ALKPHOS 83  BILITOT 0.4  PROT 7.9  ALBUMIN 4.4   Cardiac Enzymes  Recent Labs  08/22/12 1512 08/22/12 2043 08/23/12 0218  TROPONINI 0.39* 0.58* 0.65*   Hemoglobin A1C  Recent Labs  08/22/12 1512  HGBA1C 5.5   Fasting Lipid Panel  Recent Labs  08/23/12 0218  CHOL 133  HDL 31*  LDLCALC 85  TRIG 83  CHOLHDL 4.3   Disposition  Pt is being discharged home today in good condition.  Follow-up Plans & Appointments  Follow-up Information   Follow up with Norma Fredrickson, NP On 09/05/2012. (3:00 Dr. Alford Highland Nurse Practitioner)    Contact information:   [address] [place] [address] [phone]       Follow up with Elise Benne, MD. (as scheduled)    Contact information:   [address] [place] [address] [phone]     Discharge Medications    Medication List    STOP taking these medications       CELEBREX 200 MG capsule  Generic drug:  celecoxib      TAKE these medications       amLODipine 5 MG tablet  Commonly known as:  NORVASC  Take 5 mg by mouth 2 (two) times daily.     aspirin 81 MG tablet  Take 81 mg by mouth daily.     clopidogrel 75 MG tablet  Commonly known as:  PLAVIX  Take 1 tablet (75 mg total) by mouth daily.     DEXILANT 60 MG capsule  Generic drug:  dexlansoprazole  Take 60 mg by mouth daily.     enalapril 5 MG tablet  Commonly known as:  VASOTEC  Take 1 tablet (5 mg total) by mouth 2 (two) times daily.     metoprolol tartrate 25 MG tablet  Commonly known as:   LOPRESSOR  Take 0.5 tablets (12.5 mg total) by mouth 2 (two) times daily.     multivitamin tablet  Take 1 tablet by mouth daily.     niacin 500 MG CR tablet  Commonly known as:  NIASPAN  Take 500 mg by mouth at bedtime.     nitroGLYCERIN 0.4 MG SL tablet  Commonly known as:  NITROSTAT  Place 1 tablet (0.4 mg total) under the tongue every 5 (five) minutes x 3 doses as needed for chest pain.     omega-3 acid ethyl esters 1 G capsule  Commonly known as:  LOVAZA  Take 2 g by mouth daily.     rosuvastatin 20 MG tablet  Commonly known as:  CRESTOR  Take 20 mg by mouth daily.     Vitamin D3 2000 UNITS Tabs  Take by mouth daily.  Outstanding Labs/Studies  none  Duration of Discharge Encounter   Greater than 30 minutes including physician time.  Signed, Nicolasa Ducking NP 08/23/2012, 8:58 AM

## 2012-08-23 NOTE — Discharge Summary (Signed)
Patient seen and examined and history reviewed. Agree with above findings and plan. See my earlier rounding note.  Theron Arista [facility] 08/23/2012 9:56 AM

## 2012-08-23 NOTE — Progress Notes (Signed)
TELEMETRY: Reviewed telemetry pt in NSR: Filed Vitals:   08/23/12 0330 08/23/12 0337 08/23/12 0343 08/23/12 0700  BP: 141/94   160/95  Pulse:      Temp:  98.1 F (36.7 C)  97.5 F (36.4 C)  TempSrc:  Oral  Oral  Resp:      Height:      Weight:   201 lb 6.4 oz (91.354 kg)   SpO2:  96%  99%    Intake/Output Summary (Last 24 hours) at 08/23/12 1610 Last data filed at 08/22/12 2300  Gross per 24 hour  Intake   1628 ml  Output   1100 ml  Net    528 ml    SUBJECTIVE Patient feels great. Up ambulating in halls. Wants to go home. No chest pain.  LABS: Basic Metabolic Panel:  Recent Labs  96/04/54 0712 08/23/12 0218  NA 141 138  K 3.8 4.1  CL 104 103  CO2 25 28  GLUCOSE 126* 99  BUN 18 15  CREATININE 0.71 0.76  CALCIUM 10.0 9.4   Liver Function Tests:  Recent Labs  08/22/12 0712  AST 30  ALT 30  ALKPHOS 83  BILITOT 0.4  PROT 7.9  ALBUMIN 4.4   CBC:  Recent Labs  08/22/12 0712 08/23/12 0218  WBC 9.6 10.2  NEUTROABS 6.0  --   HGB 16.7 15.3  HCT 46.8 43.9  MCV 87.3 87.1  PLT 245 197   Cardiac Enzymes:  Recent Labs  08/22/12 1512 08/22/12 2043 08/23/12 0218  TROPONINI 0.39* 0.58* 0.65*   Hemoglobin A1C:  Recent Labs  08/22/12 1512  HGBA1C 5.5   Fasting Lipid Panel:  Recent Labs  08/23/12 0218  CHOL 133  HDL 31*  LDLCALC 85  TRIG 83  CHOLHDL 4.3   TRadiology/Studies:  Dg Chest 2 View  08/22/2012  *RADIOLOGY REPORT*  Clinical Data: Chest pain with dizziness.  History of hypertension.  CHEST - 2 VIEW  Comparison: 10/21/2008.  Findings: The heart size and mediastinal contours are stable. There is linear left lower lobe scarring which appears stable.  The right lung is clear.  There is no pleural effusion or pneumothorax. The osseous structures appear unchanged.  IMPRESSION: Stable left lower lobe scarring.  No acute cardiopulmonary process.   Original Report Authenticated By: Carey Bullocks, M.D.    Ecg: NSR with occ PVC. Otherwise  normal.  PHYSICAL EXAM General: Well developed, well nourished, in no acute distress. Head: Normal Neck: Negative for carotid bruits. JVD not elevated. Lungs: Clear bilaterally to auscultation without wheezes, rales, or rhonchi. Breathing is unlabored. Heart: RRR S1 S2 without murmurs, rubs, or gallops.  Abdomen: Soft, non-tender, non-distended with normoactive bowel sounds. No hepatomegaly. Msk:  Strength and tone appears normal for age. Extremities: No clubbing, cyanosis or edema.  Distal pedal pulses are 2+ and equal bilaterally. No groin hematoma. Neuro: Alert and oriented X 3. Moves all extremities spontaneously. Psych:  Responds to questions appropriately with a normal affect.  ASSESSMENT AND PLAN: 1. NSTEMI. S/p PTCA of second diagonal for instent restenosis. S/p stent of PLOM with DES. Needs to resume Plavix and ASA. States he has plavix ordered from mail order but will need a short term prescription till it arrives. Will need to stay out of work one week. Follow up in 2 weeks with Dr. Shirlee Latch.  2. HTN. Resume home doses of amlodipine 5 mg bid. Increase vasotec to 5 mg bid. Patient states he had fatigue in the past on beta blocker  but given MI and elevated BP I would continue metoprolol for now.   3. Hyperlipidemia. On Crestor and niacin.  4. GERD on Dexilant.  Principal Problem:   NSTEMI (non-ST elevated myocardial infarction) Active Problems:   DYSLIPIDEMIA   HYPERTENSION   CAD   HIATAL HERNIA WITH REFLUX   SLEEP APNEA    Signed, Peter Swaziland MD,FACC 08/23/2012 8:22 AM

## 2012-08-28 ENCOUNTER — Emergency Department (HOSPITAL_COMMUNITY): Payer: BC Managed Care – PPO

## 2012-08-28 ENCOUNTER — Emergency Department (HOSPITAL_COMMUNITY)
Admission: EM | Admit: 2012-08-28 | Discharge: 2012-08-29 | Disposition: A | Discharge status: Home / Self Care | Payer: BC Managed Care – PPO | Attending: Emergency Medicine | Admitting: Emergency Medicine

## 2012-08-28 ENCOUNTER — Encounter (HOSPITAL_COMMUNITY): Payer: Self-pay | Admitting: *Deleted

## 2012-08-28 DIAGNOSIS — Z7902 Long term (current) use of antithrombotics/antiplatelets: Secondary | ICD-10-CM | POA: Insufficient documentation

## 2012-08-28 DIAGNOSIS — I2510 Atherosclerotic heart disease of native coronary artery without angina pectoris: Secondary | ICD-10-CM | POA: Insufficient documentation

## 2012-08-28 DIAGNOSIS — I10 Essential (primary) hypertension: Secondary | ICD-10-CM | POA: Insufficient documentation

## 2012-08-28 DIAGNOSIS — K219 Gastro-esophageal reflux disease without esophagitis: Secondary | ICD-10-CM | POA: Insufficient documentation

## 2012-08-28 DIAGNOSIS — Z9861 Coronary angioplasty status: Secondary | ICD-10-CM | POA: Insufficient documentation

## 2012-08-28 DIAGNOSIS — Z860100 Personal history of colon polyps, unspecified: Secondary | ICD-10-CM | POA: Insufficient documentation

## 2012-08-28 DIAGNOSIS — Z79899 Other long term (current) drug therapy: Secondary | ICD-10-CM | POA: Insufficient documentation

## 2012-08-28 DIAGNOSIS — Z85038 Personal history of other malignant neoplasm of large intestine: Secondary | ICD-10-CM | POA: Insufficient documentation

## 2012-08-28 DIAGNOSIS — R0789 Other chest pain: Secondary | ICD-10-CM

## 2012-08-28 DIAGNOSIS — Z8719 Personal history of other diseases of the digestive system: Secondary | ICD-10-CM | POA: Insufficient documentation

## 2012-08-28 DIAGNOSIS — G4730 Sleep apnea, unspecified: Secondary | ICD-10-CM | POA: Insufficient documentation

## 2012-08-28 DIAGNOSIS — Z8679 Personal history of other diseases of the circulatory system: Secondary | ICD-10-CM | POA: Insufficient documentation

## 2012-08-28 DIAGNOSIS — E785 Hyperlipidemia, unspecified: Secondary | ICD-10-CM | POA: Insufficient documentation

## 2012-08-28 DIAGNOSIS — Z7982 Long term (current) use of aspirin: Secondary | ICD-10-CM | POA: Insufficient documentation

## 2012-08-28 DIAGNOSIS — R071 Chest pain on breathing: Secondary | ICD-10-CM | POA: Insufficient documentation

## 2012-08-28 LAB — BASIC METABOLIC PANEL
BUN: 20 mg/dL (ref 6–23)
CO2: 25 mEq/L (ref 19–32)
Chloride: 104 mEq/L (ref 96–112)
GFR calc Af Amer: >90 mL/min (ref >90)
Glucose, Bld: 138 mg/dL — ABNORMAL HIGH (ref 70–99)
Potassium: 4.4 mEq/L (ref 3.5–5.1)

## 2012-08-28 LAB — CBC
HCT: 42.4 % (ref 39.0–52.0)
Hemoglobin: 14.9 g/dL (ref 13.0–17.0)
MCV: 85.5 fL (ref 78.0–100.0)
RBC: 4.96 MIL/uL (ref 4.22–5.81)
RDW: 12.7 % (ref 11.5–15.5)
WBC: 12 10*3/uL — ABNORMAL HIGH (ref 4.0–10.5)

## 2012-08-28 NOTE — ED Notes (Signed)
At this time patient is pain free.  Pt states "I just feel hot my the medicine that I take."

## 2012-08-28 NOTE — ED Notes (Signed)
Pt states he recently had 2 cardiac stents placed (on Monday this week).  Today went to church then a funeral and felt a pinching sensation in his left chest.  Some lightheadedness at the time.  Took 2 ntg pills PTA and now CP has subsided.

## 2012-08-28 NOTE — ED Provider Notes (Signed)
History     CSN: [id number]  Arrival date & time 08/28/12  2204   First MD Initiated Contact with Patient 08/28/12 2349      Chief Complaint  Patient presents with  . Chest Pain    (Consider location/radiation/quality/duration/timing/severity/associated sxs/prior treatment) HPI [name] [name] is a 52 y.o. male an extensive medical history including hypertension, hyperlipidemia, coronary artery disease status post stenting in 2007 and most recently last Monday performed by Dr. Swaziland. Patient says he's been taking his Plavix every day and has not skipped any doses. This evening he lay down about 8:00, and about 8:30 rate 40 he noticed some chest pain. He says it was burning on the left side of his chest in the region of his prior VATS scar, says the pain is only present when he is sitting up or coughing otherwise it did not hurt. They made him concerned considering his recent catheterization and decided to come to the hospital. He did take 2 nitroglycerin, and about 15 minutes after nitroglycerin he did not have any pain. He says "feels more like a muscle pain" and his previous chest pain from MI last Monday was behind the left shoulder blade. Patient also has a pertinent family history mother having CABG x3 at age 48, dad had 2 stents at age 37. 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  Past Medical History  Diagnosis Date  . BARRETT'S ESOPHAGUS, HX OF   . HYPERTENSION   . DYSLIPIDEMIA   . CAD     a. 2007: DES to LAD and Diag;  b. 08/2011 non-ischemic myoview, EF 60%;  c. 08/2012 NSTEMI/Cath/PCI: LM n, LAD patent stent, D1 small, 80, D2 90 ISR (PTCA), LCX nl, RCA 20d, RPL 95 (2.25x12 Promus Premier DES), EF 55-65%.  Marland Kitchen SLEEP APNEA   . 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 s/p wedge resection  . ADENOCARCINOMA, SIGMOID COLON     a. s/p  resection/chemo  . HIATAL HERNIA WITH REFLUX   . GERD (gastroesophageal reflux disease)   . Diverticulosis   . Hyperplastic colon polyp   . Allergy     SEASONAL  . Anginal pain     Past Surgical History  Procedure Laterality Date  . Coronary stent placement  2007    taxus  . Colon resection  1998  . Tonsillectomy    . Lung removal, partial      LEFT  . Knee surgery    . Colon surgery      Family History  Problem Relation Age of Onset  . Heart disease Mother     alive @ 74.  s/p cabg  . Colon polyps Mother   . Heart disease Father     alive @ 40.  s/p stenting  . Colon cancer Neg Hx   . Heart attack Paternal Uncle     @ 5  . Heart attack Paternal Uncle     @ 35    History  Substance Use Topics  . Smoking status: Never Smoker   . Smokeless tobacco: Never Used  . Alcohol Use: No     Comment: occas has 4 oz of red wine.      Review of Systems At least 10pt or greater review of systems completed and are negative except where specified in the HPI.  Allergies  Review of patient's allergies indicates no  known allergies.  Home Medications   Current Outpatient Rx  Name  Route  Sig  Dispense  Refill  . amLODipine (NORVASC) 5 MG tablet   Oral   Take 5 mg by mouth 2 (two) times daily.          Marland Kitchen aspirin 81 MG tablet   Oral   Take 81 mg by mouth daily.         . Cholecalciferol (VITAMIN D3) 2000 UNITS TABS   Oral   Take by mouth daily.           . clopidogrel (PLAVIX) 75 MG tablet   Oral   Take 1 tablet (75 mg total) by mouth daily.   90 tablet   3   . DEXILANT 60 MG capsule   Oral   Take 60 mg by mouth daily.          . enalapril (VASOTEC) 5 MG tablet   Oral   Take 1 tablet (5 mg total) by mouth 2 (two) times daily.   180 tablet   3   . ibuprofen (ADVIL,MOTRIN) 800 MG tablet   Oral   Take 800 mg by mouth every 8 (eight) hours as needed for pain.         . metoprolol tartrate (LOPRESSOR) 25 MG tablet   Oral   Take 0.5 tablets  (12.5 mg total) by mouth 2 (two) times daily.   30 tablet   6   . Multiple Vitamin (MULTIVITAMIN) tablet   Oral   Take 1 tablet by mouth daily.           . niacin (NIASPAN) 500 MG CR tablet   Oral   Take 500 mg by mouth at bedtime.           . nitroGLYCERIN (NITROSTAT) 0.4 MG SL tablet   Sublingual   Place 1 tablet (0.4 mg total) under the tongue every 5 (five) minutes x 3 doses as needed for chest pain.   25 tablet   3   . omega-3 acid ethyl esters (LOVAZA) 1 G capsule   Oral   Take 2 g by mouth daily.           . rosuvastatin (CRESTOR) 20 MG tablet   Oral   Take 20 mg by mouth daily.             BP 139/91  Pulse 85  Temp(Src) 97.7 F (36.5 C) (Oral)  Resp 16  SpO2 96%  Physical Exam  Nursing notes reviewed.  Electronic medical record reviewed. VITAL SIGNS:   Filed Vitals:   08/28/12 2214  BP: 139/91  Pulse: 85  Temp: 97.7 F (36.5 C)  TempSrc: Oral  Resp: 16  SpO2: 96%   CONSTITUTIONAL: Awake, oriented, appears non-toxic HENT: Atraumatic, normocephalic, oral mucosa pink and moist, airway patent. Nares patent without drainage. External ears normal. EYES: Conjunctiva clear, EOMI, PERRLA NECK: Trachea midline, non-tender, supple CARDIOVASCULAR: Normal heart rate, Normal rhythm, No murmurs, rubs, gallops PULMONARY/CHEST: Clear to auscultation, no rhonchi, wheezes, or rales. Symmetrical breath sounds. Patient has 2 VATS scars over the left aspect of the lateral thorax approximately fourth rib spaces. He is tender over the most anterior scar. She has another scar of the anterior right-sided chest. No heel no tenderness to palpation. ABDOMINAL: Non-distended, soft, non-tender - no rebound or guarding.  BS normal. NEUROLOGIC: Non-focal, moving all four extremities, no gross sensory or motor deficits. EXTREMITIES: No clubbing, cyanosis, or edema SKIN: Warm, Dry, No erythema,  No rash  ED Course  Procedures (including critical care time)  Date:  08/29/2012  Rate: 83  Rhythm: normal sinus rhythm  QRS Axis: normal  Intervals: normal  ST/T Wave abnormalities: normal  Conduction Disutrbances: none  Narrative Interpretation: unremarkable - normal sinus rhythm, no significant change from prior EKG 08/22/2012 rate is slower now.   Labs Reviewed  CBC - Abnormal; Notable for the following:    WBC 12.0 (*)    All other components within normal limits  BASIC METABOLIC PANEL - Abnormal; Notable for the following:    Glucose, Bld 138 (*)    All other components within normal limits  POCT I-STAT TROPONIN I   Dg Chest 2 View  08/28/2012  *RADIOLOGY REPORT*  Clinical Data: Left-sided chest pain, cough, dizziness and tachycardia.  CHEST - 2 VIEW  Comparison: Chest radiograph performed 08/22/2012  Findings: The lungs are well-aerated.  Mild scarring is noted at the left lung base.  There is no evidence of focal opacification, pleural effusion or pneumothorax.  Minimal densities at the lung bases are thought to reflect overlying soft tissues.  The heart is normal in size; the mediastinal contour is within normal limits.  No acute osseous abnormalities are seen.  IMPRESSION: No acute cardiopulmonary process seen; stable scarring at the left lung base.   Original Report Authenticated By: Tonia Ghent, M.D.      1. Chest wall pain       MDM  [name] [name] is a 52 y.o. male with a history of coronary artery disease, who is status post stenting last Monday, patient says this pain feels mostly muscular to him. Patient does have reproducible pain over the anterior VATS scars on the left chest, EKG is unremarkable. Initial troponin is negative.    Followup troponin at 3 AM, was negative.  Also check for troponins was unremarkable, EKG unremarkable, patient has been pain-free. I do not think nitroglycerin alleviated his pain, he said he only had the pain that was sharp and intermittent when he moved his chest musculature.  At this point I think his  pain is noncardiac, he does have CAD and is status post stenting, he has been taking his medication as required.  I had a discussion concerning risks and benefits of discharging to home and following up with cardiology as an outpatient, patient and his wife understand and accept these risks is identified them as he is not low risk, but I do not think his pain is caused from his heart - so I do not think that this episode represents angina or anginal equivalent rather I think his chest wall pain and so would not be causative in any future cardiac events. Patient understands and accepts the medical plan as it's been dictated, I have answered he and his wife's questions he'll followup with Dr. Swaziland in 2 days. He'll return to the ER for any concerning symptoms.         Jones Skene, MD 08/29/12 [phone]

## 2012-08-29 LAB — POCT I-STAT TROPONIN I: Troponin i, poc: 0 ng/mL (ref 0.00–0.08)

## 2012-09-05 ENCOUNTER — Ambulatory Visit (INDEPENDENT_AMBULATORY_CARE_PROVIDER_SITE_OTHER): Payer: BC Managed Care – PPO | Admitting: Nurse Practitioner

## 2012-09-05 ENCOUNTER — Encounter: Payer: Self-pay | Admitting: Nurse Practitioner

## 2012-09-05 ENCOUNTER — Encounter: Payer: Self-pay | Admitting: *Deleted

## 2012-09-05 VITALS — BP 140/80 | HR 80 | Ht 72.0 in | Wt 207.4 lb

## 2012-09-05 DIAGNOSIS — I2510 Atherosclerotic heart disease of native coronary artery without angina pectoris: Secondary | ICD-10-CM

## 2012-09-05 DIAGNOSIS — I10 Essential (primary) hypertension: Secondary | ICD-10-CM

## 2012-09-05 MED ORDER — LOSARTAN POTASSIUM 50 MG PO TABS: 50.0000 mg | ORAL_TABLET | Freq: Every day | ORAL | Status: DC

## 2012-09-05 NOTE — Progress Notes (Signed)
[name] [name] Date of Birth: 1961/02/13 Medical Record #[id number]  History of Present Illness: [name] [name] is seen back today for a post hospital visit. He is seen for Dr. Shirlee Latch. This is a TOC visit. He has known CAD, HTN, HLD, hiatal hernia with GERD and sleep apnea. He has had prior PCI to the LAD and DX in 2007. Had remote colon cancer 16 years ago and did have a spot show up in his lung and had to have a VATS.   Most recently admitted with chest pain. Troponin mildly elevated. He was cathed and had PTCA of in-stent restenosis in the 2nd DX and DES placement with the right posterolateral branch. His Celebrex was stopped. He is on Plavix. Did go back to the ER last Sunday night (the 13th) for elevated BP and heart rate. He was having some chest pain which was felt to be from prior VATS for lung cancer.   He comes back today. He is here alone. Doing well. No complaints. No problems with his cath site. BP is much better at home - 120's over 70's at home. Heart rate is in the 60's. Other than some fatigue which he feels is related to his medicines, he is doing ok. Has not heard from Cardiac Rehab but is interested in going. Now with a dry hacky cough since his ACE was increased. No real allergy symptoms. He says he was told that his Plavix will be long term.    Current Outpatient Prescriptions on File Prior to Visit  Medication Sig Dispense Refill  . amLODipine (NORVASC) 5 MG tablet Take 5 mg by mouth 2 (two) times daily.       Marland Kitchen aspirin 81 MG tablet Take 81 mg by mouth daily.      . Cholecalciferol (VITAMIN D3) 2000 UNITS TABS Take by mouth daily.        . clopidogrel (PLAVIX) 75 MG tablet Take 1 tablet (75 mg total) by mouth daily.  90 tablet  3  . DEXILANT 60 MG capsule Take 60 mg by mouth daily.       Marland Kitchen ibuprofen (ADVIL,MOTRIN) 800 MG tablet Take 800 mg by mouth every 8 (eight) hours as needed for pain.      . metoprolol tartrate (LOPRESSOR) 25 MG tablet Take 0.5 tablets (12.5 mg total)  by mouth 2 (two) times daily.  30 tablet  6  . Multiple Vitamin (MULTIVITAMIN) tablet Take 1 tablet by mouth daily.        . niacin (NIASPAN) 500 MG CR tablet Take 500 mg by mouth at bedtime.        . nitroGLYCERIN (NITROSTAT) 0.4 MG SL tablet Place 1 tablet (0.4 mg total) under the tongue every 5 (five) minutes x 3 doses as needed for chest pain.  25 tablet  3  . omega-3 acid ethyl esters (LOVAZA) 1 G capsule Take 2 g by mouth daily.        . rosuvastatin (CRESTOR) 20 MG tablet Take 20 mg by mouth daily.         No current facility-administered medications on file prior to visit.    No Known Allergies  Past Medical History  Diagnosis Date  . BARRETT'S ESOPHAGUS, HX OF   . HYPERTENSION   . DYSLIPIDEMIA   . CAD     a. 2007: DES to LAD and Diag;  b. 08/2011 non-ischemic myoview, EF 60%;  c. 08/2012 NSTEMI/Cath/PCI: LM n, LAD patent stent, D1 small, 80, D2 90 ISR (  PTCA), LCX nl, RCA 20d, RPL 95 (2.25x12 Promus Premier DES), EF 55-65%.  Marland Kitchen SLEEP APNEA   . 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 s/p wedge resection  . ADENOCARCINOMA, SIGMOID COLON     a. s/p resection/chemo  . HIATAL HERNIA WITH REFLUX   . GERD (gastroesophageal reflux disease)   . Diverticulosis   . Hyperplastic colon polyp   . Allergy     SEASONAL  . Anginal pain     Past Surgical History  Procedure Laterality Date  . Coronary stent placement  2007    taxus  . Colon resection  1998  . Tonsillectomy    . Lung removal, partial      LEFT  . Knee surgery    . Colon surgery      History  Smoking status  . Never Smoker   Smokeless tobacco  . Never Used    History  Alcohol Use No    Comment: occas has 4 oz of red wine.    Family History  Problem Relation Age of Onset  . Heart disease Mother     alive @ 26.  s/p cabg  . Colon polyps Mother   . Heart disease Father     alive @ 26.  s/p stenting  . Colon cancer Neg Hx   . Heart attack Paternal Uncle     @ 75  . Heart attack Paternal Uncle      @ 72    Review of Systems: The review of systems is per the HPI.  All other systems were reviewed and are negative.  Physical Exam: BP 140/80  Pulse 80  Ht 6' (1.829 m)  Wt 207 lb 6.4 oz (94.076 kg)  BMI 28.12 kg/m2 Patient is very pleasant and in no acute distress. Skin is warm and dry. Color is normal.  HEENT is unremarkable. Normocephalic/atraumatic. PERRL. Sclera are nonicteric. Neck is supple. No masses. No JVD. Lungs are clear. Cardiac exam shows a regular rate and rhythm. Abdomen is soft. Extremities are without edema. Gait and ROM are intact. No gross neurologic deficits noted.  LABORATORY DATA:  Lab Results  Component Value Date   WBC 12.0* 08/28/2012   HGB 14.9 08/28/2012   HCT 42.4 08/28/2012   PLT 243 08/28/2012   GLUCOSE 138* 08/28/2012   CHOL 133 08/23/2012   TRIG 83 08/23/2012   HDL 31* 08/23/2012   LDLCALC 85 08/23/2012   ALT 30 08/22/2012   AST 30 08/22/2012   NA 139 08/28/2012   K 4.4 08/28/2012   CL 104 08/28/2012   CREATININE 0.74 08/28/2012   BUN 20 08/28/2012   CO2 25 08/28/2012   HGBA1C 5.5 08/22/2012   Coronary angiography:   Left mainstem: The left main coronary is normal.  Left anterior descending (LAD): The left anterior descending artery gives rise to 2 diagonal branches. There is a stent in the mid LAD immediately after the second diagonal. The stent is widely patent. The first diagonal is very small in caliber and has an 80% stenosis at its origin. The second diagonal is large and has 90% in-stent restenosis in the proximal vessel. This involves the proximal one half of the stent.  Left circumflex (LCx): The left circumflex gives rise to several small marginal branches. It is normal.  Right coronary artery (RCA): The right coronary is a large dominant vessel. It has mild plaque in the mid and distal vessel up to 20%. The posterior lateral branch has a 95%  focal stenosis proximally.  Left ventriculography: Left ventricular systolic function is normal, LVEF is  estimated at 55-65%, there is no significant mitral regurgitation   PCI Procedure Note: Following the diagnostic procedure, the decision was made to proceed with PCI. The sheath was upsized to a 6 [place]. Weight-based bivalirudin was given for anticoagulation. The patient was loaded with 600 mg of Plavix by mouth. Once a therapeutic ACT was achieved, a 6 [place] CLS 3.5 guide catheter was inserted. A pro-water coronary guidewire was used to cross the lesion in the diagonal. The lesion was dilated with a 2.5 x 10 mm angiosculpt balloon dilating up to 20 atmospheres. This yielded an excellent angiographic result with 0% residual stenosis and TIMI grade 3 flow.  We next proceeded with intervention of the posterior lateral branch of the right coronary. We switched to a JR 4 guide. A whisper wire was used to cross the lesion. The lesion was predilated with a 2.25 mm compliant balloon The lesion was then stented with a 2.25 x 12 mm Promus premier stent dilating up to 14 atmospheres with the stent balloon. Following PCI, there was 0% residual stenosis and TIMI-3 flow. Final angiography confirmed an excellent result. Femoral hemostasis was achieved with manual compression. The patient tolerated the PCI procedure well. There were no immediate procedural complications. The patient was transferred to the post catheterization recovery area for further monitoring.   PCI Data:  Vessel #1- Second diagonal/Segment - proximal  Percent Stenosis (pre) 90%  TIMI-flow 3  Angioplasty with an Angiosculpt balloon  Percent Stenosis (post) 0%  TIMI-flow (post) 3  Vessel #2-PLOM/proximal  Percent stenoses (pre-) 95%  TIMI flow 3  Stent: 2.25 x 12 mm Promus premier  Percent stenoses (post) 0%  TIMI flow (post) 3   Final Conclusions:  1. 2 vessel obstructive coronary disease. There is severe in-stent restenosis in the second diagonal branch. There is a de novo lesion in the posterior lateral branch of the right coronary. The  stent in the mid LAD is widely patent.  2. Normal left ventricular function.  3. Successful angioplasty with an Angiosculpt balloon in the second diagonal and successful stenting of the PLO M. with a drug-eluting stent.   Recommendations: Continue dual antiplatelet therapy for at least one year.  Theron [facility]  08/22/2012, 12:42 PM   Assessment / Plan: 1. NSTEMI with 2 vessel obstructive CAD noted. He is s/p PCTA of the 2nd DX and stenting of the posterior lateral branch of the RCA. On DAPT for at least one year but probably long term. He is doing well. I have sent a message to rehab to get him started.  2. HTN - blood pressure is better at home. Does have probable ACE cough. Will stop the Enalapril and try Losartan 50 mg a day - he will continue to monitor at home.   3. HLD - on statin therapy. Recheck on return visit.  Patient is agreeable to this plan and will call if any problems develop in the interim.   Rosalio Macadamia, RN, ANP-C [facility] [address] [place], [place]  [address]

## 2012-09-05 NOTE — Patient Instructions (Addendum)
Stay on your current medicines except we are stopping your Enalapril due to the cough  We are going to try you on Losartan 50 mg once a day in the place of the Enalapril. If your cough resolves, call us and we will send in a 90 day supply. This prescription is at the [facility] in [facility]  See Dr. Shirlee Latch in 3 weeks  Monitor your blood pressure at home and keep a diary - bring this in for him to review.   I have sent a message to the staff at Cardiac Rehab  Call the [facility] office at [phone] if you have any questions, problems or concerns.

## 2012-09-08 ENCOUNTER — Telehealth: Payer: Self-pay | Admitting: Cardiology

## 2012-09-08 NOTE — Telephone Encounter (Signed)
New problem   [name] [name] would like to speak to you about referral for rehab. Please call [name] [name]

## 2012-09-09 NOTE — Telephone Encounter (Signed)
Returned call to Byrd Hesselbach she has already left for the day.Left message with Albin Felling to have Byrd Hesselbach call me back 09/12/12.

## 2012-09-12 NOTE — Telephone Encounter (Signed)
Returned call to [name] [name] she already has cardiac rehab.

## 2012-09-22 ENCOUNTER — Ambulatory Visit (HOSPITAL_COMMUNITY): Payer: BC Managed Care – PPO

## 2012-09-23 ENCOUNTER — Encounter (HOSPITAL_COMMUNITY)
Admission: RE | Admit: 2012-09-23 | Discharge: 2012-09-23 | Disposition: A | Discharge status: Home / Self Care | Payer: BC Managed Care – PPO | Source: Ambulatory Visit | Attending: Cardiology | Admitting: Cardiology

## 2012-09-23 DIAGNOSIS — Z5189 Encounter for other specified aftercare: Secondary | ICD-10-CM | POA: Insufficient documentation

## 2012-09-23 DIAGNOSIS — I214 Non-ST elevation (NSTEMI) myocardial infarction: Secondary | ICD-10-CM | POA: Insufficient documentation

## 2012-09-23 DIAGNOSIS — I10 Essential (primary) hypertension: Secondary | ICD-10-CM | POA: Insufficient documentation

## 2012-09-23 DIAGNOSIS — I2510 Atherosclerotic heart disease of native coronary artery without angina pectoris: Secondary | ICD-10-CM | POA: Insufficient documentation

## 2012-09-26 ENCOUNTER — Ambulatory Visit (HOSPITAL_COMMUNITY): Payer: BC Managed Care – PPO

## 2012-09-26 ENCOUNTER — Ambulatory Visit: Payer: BC Managed Care – PPO | Admitting: Nurse Practitioner

## 2012-09-28 ENCOUNTER — Ambulatory Visit (HOSPITAL_COMMUNITY): Payer: BC Managed Care – PPO

## 2012-09-30 ENCOUNTER — Ambulatory Visit (HOSPITAL_COMMUNITY): Payer: BC Managed Care – PPO

## 2012-10-03 ENCOUNTER — Ambulatory Visit (HOSPITAL_COMMUNITY): Payer: BC Managed Care – PPO

## 2012-10-05 ENCOUNTER — Ambulatory Visit: Payer: BC Managed Care – PPO | Admitting: Cardiology

## 2012-10-05 ENCOUNTER — Ambulatory Visit (HOSPITAL_COMMUNITY): Payer: BC Managed Care – PPO

## 2012-10-05 ENCOUNTER — Encounter (HOSPITAL_COMMUNITY)
Admission: RE | Admit: 2012-10-05 | Discharge: 2012-10-05 | Disposition: A | Discharge status: Home / Self Care | Payer: BC Managed Care – PPO | Source: Ambulatory Visit | Attending: Cardiology | Admitting: Cardiology

## 2012-10-05 NOTE — Progress Notes (Signed)
Pt started cardiac rehab today.  Pt tolerated light exercise without difficulty. Telemetry Sinus without ectopy. Will continue to monitor the patient throughout  the program.

## 2012-10-07 ENCOUNTER — Ambulatory Visit (HOSPITAL_COMMUNITY): Payer: BC Managed Care – PPO

## 2012-10-07 ENCOUNTER — Encounter (HOSPITAL_COMMUNITY)
Admission: RE | Admit: 2012-10-07 | Discharge: 2012-10-07 | Disposition: A | Discharge status: Home / Self Care | Payer: BC Managed Care – PPO | Source: Ambulatory Visit | Attending: Cardiology | Admitting: Cardiology

## 2012-10-10 ENCOUNTER — Ambulatory Visit (HOSPITAL_COMMUNITY): Payer: BC Managed Care – PPO

## 2012-10-12 ENCOUNTER — Ambulatory Visit (HOSPITAL_COMMUNITY): Payer: BC Managed Care – PPO

## 2012-10-12 ENCOUNTER — Encounter (HOSPITAL_COMMUNITY)
Admission: RE | Admit: 2012-10-12 | Discharge: 2012-10-12 | Disposition: A | Discharge status: Home / Self Care | Payer: BC Managed Care – PPO | Source: Ambulatory Visit | Attending: Cardiology | Admitting: Cardiology

## 2012-10-14 ENCOUNTER — Ambulatory Visit (HOSPITAL_COMMUNITY): Payer: BC Managed Care – PPO

## 2012-10-14 ENCOUNTER — Encounter (HOSPITAL_COMMUNITY)
Admission: RE | Admit: 2012-10-14 | Discharge: 2012-10-14 | Disposition: A | Discharge status: Home / Self Care | Payer: BC Managed Care – PPO | Source: Ambulatory Visit | Attending: Cardiology | Admitting: Cardiology

## 2012-10-17 ENCOUNTER — Ambulatory Visit (HOSPITAL_COMMUNITY): Payer: BC Managed Care – PPO

## 2012-10-19 ENCOUNTER — Encounter (HOSPITAL_COMMUNITY)
Admission: RE | Admit: 2012-10-19 | Discharge: 2012-10-19 | Disposition: A | Discharge status: Home / Self Care | Payer: BC Managed Care – PPO | Source: Ambulatory Visit | Attending: Cardiology | Admitting: Cardiology

## 2012-10-19 ENCOUNTER — Ambulatory Visit (HOSPITAL_COMMUNITY): Payer: BC Managed Care – PPO

## 2012-10-19 DIAGNOSIS — I2510 Atherosclerotic heart disease of native coronary artery without angina pectoris: Secondary | ICD-10-CM | POA: Insufficient documentation

## 2012-10-19 DIAGNOSIS — I214 Non-ST elevation (NSTEMI) myocardial infarction: Secondary | ICD-10-CM | POA: Insufficient documentation

## 2012-10-19 DIAGNOSIS — I10 Essential (primary) hypertension: Secondary | ICD-10-CM | POA: Insufficient documentation

## 2012-10-19 DIAGNOSIS — Z5189 Encounter for other specified aftercare: Secondary | ICD-10-CM | POA: Insufficient documentation

## 2012-10-19 NOTE — Progress Notes (Signed)
Reviewed home exercise with pt today.  Pt plans to walk, use stationary bike, and weight bench (when ready) at home for exercise.  Reviewed THR, pulse, RPE, sign and symptoms, NTG use, and when to call 911 or MD.  Pt voiced understanding. Fabio Pierce, MA, ACSM RCEP

## 2012-10-21 ENCOUNTER — Ambulatory Visit (HOSPITAL_COMMUNITY): Payer: BC Managed Care – PPO

## 2012-10-21 ENCOUNTER — Encounter (HOSPITAL_COMMUNITY): Payer: BC Managed Care – PPO

## 2012-10-24 ENCOUNTER — Ambulatory Visit (HOSPITAL_COMMUNITY): Payer: BC Managed Care – PPO

## 2012-10-26 ENCOUNTER — Encounter (HOSPITAL_COMMUNITY)
Admission: RE | Admit: 2012-10-26 | Discharge: 2012-10-26 | Disposition: A | Discharge status: Home / Self Care | Payer: BC Managed Care – PPO | Source: Ambulatory Visit | Attending: Cardiology | Admitting: Cardiology

## 2012-10-26 ENCOUNTER — Ambulatory Visit (HOSPITAL_COMMUNITY): Payer: BC Managed Care – PPO

## 2012-10-26 NOTE — Progress Notes (Signed)
[name] [name]'s blood pressure was noted at 204/80 on the bike exercise stopped.  Repeat blood pressure 170/80 then 120/ 78.   Pre exercise blood pressure 132/60.  [name] called his wife and found out he forgot to take his medications this morning.  Marvis Repress NP called and notified. [name] said [name] needs to go home to take his medications. [name] left exercise without complaints and is going home to take his medications.

## 2012-10-28 ENCOUNTER — Encounter (HOSPITAL_COMMUNITY)
Admission: RE | Admit: 2012-10-28 | Discharge: 2012-10-28 | Disposition: A | Discharge status: Home / Self Care | Payer: BC Managed Care – PPO | Source: Ambulatory Visit | Attending: Cardiology | Admitting: Cardiology

## 2012-10-28 ENCOUNTER — Ambulatory Visit (HOSPITAL_COMMUNITY): Payer: BC Managed Care – PPO

## 2012-10-31 ENCOUNTER — Ambulatory Visit (HOSPITAL_COMMUNITY): Payer: BC Managed Care – PPO

## 2012-11-02 ENCOUNTER — Encounter (HOSPITAL_COMMUNITY): Payer: BC Managed Care – PPO

## 2012-11-02 ENCOUNTER — Ambulatory Visit (HOSPITAL_COMMUNITY): Payer: BC Managed Care – PPO

## 2012-11-04 ENCOUNTER — Encounter (HOSPITAL_COMMUNITY)
Admission: RE | Admit: 2012-11-04 | Discharge: 2012-11-04 | Disposition: A | Discharge status: Home / Self Care | Payer: BC Managed Care – PPO | Source: Ambulatory Visit | Attending: Cardiology | Admitting: Cardiology

## 2012-11-04 ENCOUNTER — Ambulatory Visit (HOSPITAL_COMMUNITY): Payer: BC Managed Care – PPO

## 2012-11-04 NOTE — Progress Notes (Signed)
[name] [name] graduates today due to his work schedule. [name] [name] is interested in joining a gym. [name] [name] plans to continue exercise on his own.

## 2012-11-07 ENCOUNTER — Ambulatory Visit (HOSPITAL_COMMUNITY): Payer: BC Managed Care – PPO

## 2012-11-09 ENCOUNTER — Ambulatory Visit (HOSPITAL_COMMUNITY): Payer: BC Managed Care – PPO

## 2012-11-09 ENCOUNTER — Encounter (HOSPITAL_COMMUNITY): Payer: BC Managed Care – PPO

## 2012-11-11 ENCOUNTER — Ambulatory Visit (HOSPITAL_COMMUNITY): Payer: BC Managed Care – PPO

## 2012-11-11 ENCOUNTER — Encounter (HOSPITAL_COMMUNITY): Payer: BC Managed Care – PPO

## 2012-11-14 ENCOUNTER — Ambulatory Visit (HOSPITAL_COMMUNITY): Payer: BC Managed Care – PPO

## 2012-11-16 ENCOUNTER — Ambulatory Visit (HOSPITAL_COMMUNITY): Payer: BC Managed Care – PPO

## 2012-11-16 ENCOUNTER — Encounter (HOSPITAL_COMMUNITY): Payer: BC Managed Care – PPO

## 2012-11-18 ENCOUNTER — Ambulatory Visit (HOSPITAL_COMMUNITY): Payer: BC Managed Care – PPO

## 2012-11-18 ENCOUNTER — Encounter (HOSPITAL_COMMUNITY): Payer: BC Managed Care – PPO

## 2012-11-21 ENCOUNTER — Ambulatory Visit (HOSPITAL_COMMUNITY): Payer: BC Managed Care – PPO

## 2012-11-23 ENCOUNTER — Encounter (HOSPITAL_COMMUNITY): Payer: BC Managed Care – PPO

## 2012-11-23 ENCOUNTER — Ambulatory Visit (HOSPITAL_COMMUNITY): Payer: BC Managed Care – PPO

## 2012-11-25 ENCOUNTER — Ambulatory Visit (HOSPITAL_COMMUNITY): Payer: BC Managed Care – PPO

## 2012-11-25 ENCOUNTER — Encounter (HOSPITAL_COMMUNITY): Payer: BC Managed Care – PPO

## 2012-11-28 ENCOUNTER — Ambulatory Visit (HOSPITAL_COMMUNITY): Payer: BC Managed Care – PPO

## 2012-11-30 ENCOUNTER — Ambulatory Visit (HOSPITAL_COMMUNITY): Payer: BC Managed Care – PPO

## 2012-11-30 ENCOUNTER — Encounter (HOSPITAL_COMMUNITY): Payer: BC Managed Care – PPO

## 2012-12-02 ENCOUNTER — Encounter (HOSPITAL_COMMUNITY): Payer: BC Managed Care – PPO

## 2012-12-02 ENCOUNTER — Ambulatory Visit (HOSPITAL_COMMUNITY): Payer: BC Managed Care – PPO

## 2012-12-05 ENCOUNTER — Ambulatory Visit (HOSPITAL_COMMUNITY): Payer: BC Managed Care – PPO

## 2012-12-06 ENCOUNTER — Emergency Department (HOSPITAL_COMMUNITY)
Admission: EM | Admit: 2012-12-06 | Discharge: 2012-12-06 | Disposition: A | Discharge status: Home / Self Care | Payer: BC Managed Care – PPO | Attending: Emergency Medicine | Admitting: Emergency Medicine

## 2012-12-06 ENCOUNTER — Encounter (HOSPITAL_COMMUNITY): Payer: Self-pay | Admitting: *Deleted

## 2012-12-06 DIAGNOSIS — I10 Essential (primary) hypertension: Secondary | ICD-10-CM | POA: Insufficient documentation

## 2012-12-06 DIAGNOSIS — Z79899 Other long term (current) drug therapy: Secondary | ICD-10-CM | POA: Insufficient documentation

## 2012-12-06 DIAGNOSIS — I2510 Atherosclerotic heart disease of native coronary artery without angina pectoris: Secondary | ICD-10-CM | POA: Insufficient documentation

## 2012-12-06 DIAGNOSIS — Z85038 Personal history of other malignant neoplasm of large intestine: Secondary | ICD-10-CM | POA: Insufficient documentation

## 2012-12-06 DIAGNOSIS — E785 Hyperlipidemia, unspecified: Secondary | ICD-10-CM | POA: Insufficient documentation

## 2012-12-06 DIAGNOSIS — G4730 Sleep apnea, unspecified: Secondary | ICD-10-CM | POA: Insufficient documentation

## 2012-12-06 DIAGNOSIS — Z8719 Personal history of other diseases of the digestive system: Secondary | ICD-10-CM | POA: Insufficient documentation

## 2012-12-06 DIAGNOSIS — Z7982 Long term (current) use of aspirin: Secondary | ICD-10-CM | POA: Insufficient documentation

## 2012-12-06 DIAGNOSIS — K219 Gastro-esophageal reflux disease without esophagitis: Secondary | ICD-10-CM | POA: Insufficient documentation

## 2012-12-06 NOTE — ED Provider Notes (Signed)
History    CSN: [id number] Arrival date & time 12/06/12  6295  First MD Initiated Contact with Patient 12/06/12 0440     Chief Complaint  Patient presents with  . Hypertension   (Consider location/radiation/quality/duration/timing/severity/associated sxs/prior Treatment) HPI This is a 52 year old male with a history of coronary artery disease status post stenting in April of this year for non-ST elevation myocardial infarction. We will cut the good work this morning about 2 AM he took his blood pressure and found it to be 154/101. He states it normally runs about 122/70. He became very anxious and decided to be checked out in the ED. There was no associated chest pain, back pain (the location of his most recent anginal equivalent), palpitations, shortness of breath, visual changes, headache or focal neurologic changes. He took his usual morning medications about 3:30 AM, an hour earlier than usual. He states he has been taking ibuprofen over the weekend due to pain in his heel and wonders if this may have affected his blood pressure. He insists that he has been taking his medications regularly and has not skipped a dose.  His blood pressure arrival here was 150/95 and rechecked to be 131/94 after being given reassurance.  Past Medical History  Diagnosis Date  . BARRETT'S ESOPHAGUS, HX OF   . HYPERTENSION   . DYSLIPIDEMIA   . CAD     a. 2007: DES to LAD and Diag;  b. 08/2011 non-ischemic myoview, EF 60%;  c. 08/2012 NSTEMI/Cath/PCI: LM n, LAD patent stent, D1 small, 80, D2 90 ISR (PTCA), LCX nl, RCA 20d, RPL 95 (2.25x12 Promus Premier DES), EF 55-65%.  Marland Kitchen SLEEP APNEA   . 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 s/p wedge resection  . ADENOCARCINOMA, SIGMOID COLON     a. s/p resection/chemo  . HIATAL HERNIA WITH REFLUX   . GERD (gastroesophageal reflux disease)   . Diverticulosis   . Hyperplastic colon polyp   . Allergy     SEASONAL  . Anginal pain    Past Surgical History   Procedure Laterality Date  . Coronary stent placement  2007    taxus  . Colon resection  1998  . Tonsillectomy    . Lung removal, partial      LEFT  . Knee surgery    . Colon surgery     Family History  Problem Relation Age of Onset  . Heart disease Mother     alive @ 37.  s/p cabg  . Colon polyps Mother   . Heart disease Father     alive @ 40.  s/p stenting  . Colon cancer Neg Hx   . Heart attack Paternal Uncle     @ 82  . Heart attack Paternal Uncle     @ 27   History  Substance Use Topics  . Smoking status: Never Smoker   . Smokeless tobacco: Never Used  . Alcohol Use: No     Comment: occas has 4 oz of red wine.    Review of Systems  All other systems reviewed and are negative.    Allergies  Review of patient's allergies indicates no known allergies.  Home Medications   Current Outpatient Rx  Name  Route  Sig  Dispense  Refill  . amLODipine (NORVASC) 5 MG tablet   Oral   Take 5 mg by mouth 2 (two) times daily.          Marland Kitchen aspirin 81 MG tablet  Oral   Take 81 mg by mouth daily.         . Cholecalciferol (VITAMIN D3) 2000 UNITS TABS   Oral   Take by mouth daily.           . clopidogrel (PLAVIX) 75 MG tablet   Oral   Take 1 tablet (75 mg total) by mouth daily.   90 tablet   3   . DEXILANT 60 MG capsule   Oral   Take 60 mg by mouth daily.          . enalapril (VASOTEC) 5 MG tablet   Oral   Take 5 mg by mouth 2 (two) times daily.         Marland Kitchen ibuprofen (ADVIL,MOTRIN) 800 MG tablet   Oral   Take 800 mg by mouth every 8 (eight) hours as needed for pain.         . metoprolol tartrate (LOPRESSOR) 25 MG tablet   Oral   Take 0.5 tablets (12.5 mg total) by mouth 2 (two) times daily.   30 tablet   6   . Multiple Vitamin (MULTIVITAMIN) tablet   Oral   Take 1 tablet by mouth daily.           . niacin (NIASPAN) 500 MG CR tablet   Oral   Take 1,000 mg by mouth 2 (two) times daily.          . nitroGLYCERIN (NITROSTAT) 0.4 MG SL  tablet   Sublingual   Place 1 tablet (0.4 mg total) under the tongue every 5 (five) minutes x 3 doses as needed for chest pain.   25 tablet   3   . omega-3 acid ethyl esters (LOVAZA) 1 G capsule   Oral   Take 1 g by mouth 2 (two) times daily.          . rosuvastatin (CRESTOR) 20 MG tablet   Oral   Take 20 mg by mouth daily.            BP 150/95  Temp(Src) 97.6 F (36.4 C) (Oral)  Resp 18  SpO2 100%  Physical Exam General: Well-developed, well-nourished male in no acute distress; appearance consistent with age of record HENT: normocephalic, atraumatic Eyes: pupils equal round and reactive to light; extraocular muscles intact Neck: supple Heart: regular rate and rhythm; no murmurs, rubs or gallops Lungs: clear to auscultation bilaterally Abdomen: soft; nondistended Extremities: No deformity; full range of motion Neurologic: Awake, alert and oriented; motor function intact in all extremities and symmetric; no facial droop Skin: Warm and dry Psychiatric: Normal mood and affect    ED Course  Procedures (including critical care time)   MDM   Date: 12/06/2012 4:48 AM  Rate: 68  Rhythm: normal sinus rhythm  QRS Axis: normal  Intervals: normal  ST/T Wave abnormalities: normal  Conduction Disutrbances: none  Narrative Interpretation: unremarkable  Comparison with previous EKG: unchanged  4:59 AM Patient reassured about his blood pressure. He was advised that we do not acutely treat mild asymptomatic hypertension. He was advised to keep a log of his blood pressure. He was advised to certainly return should he have chest pain, shortness of breath or other worsening symptoms.    Hanley Seamen, MD 12/06/12 [phone]

## 2012-12-06 NOTE — ED Notes (Signed)
The pt has high bp since 2200.  He has been checking it frequently tonight.  No pain anywhere

## 2012-12-07 ENCOUNTER — Ambulatory Visit (HOSPITAL_COMMUNITY): Payer: BC Managed Care – PPO

## 2012-12-07 ENCOUNTER — Encounter (HOSPITAL_COMMUNITY): Payer: BC Managed Care – PPO

## 2012-12-09 ENCOUNTER — Ambulatory Visit (HOSPITAL_COMMUNITY): Payer: BC Managed Care – PPO

## 2012-12-09 ENCOUNTER — Encounter (HOSPITAL_COMMUNITY): Payer: BC Managed Care – PPO

## 2012-12-14 ENCOUNTER — Encounter (HOSPITAL_COMMUNITY): Payer: BC Managed Care – PPO

## 2012-12-16 ENCOUNTER — Encounter (HOSPITAL_COMMUNITY): Payer: BC Managed Care – PPO

## 2012-12-21 ENCOUNTER — Encounter (HOSPITAL_COMMUNITY): Payer: BC Managed Care – PPO

## 2012-12-23 ENCOUNTER — Encounter (HOSPITAL_COMMUNITY): Payer: BC Managed Care – PPO

## 2012-12-28 ENCOUNTER — Encounter (HOSPITAL_COMMUNITY): Payer: BC Managed Care – PPO

## 2012-12-30 ENCOUNTER — Encounter (HOSPITAL_COMMUNITY): Payer: BC Managed Care – PPO

## 2013-01-02 ENCOUNTER — Encounter: Payer: Self-pay | Admitting: Cardiology

## 2013-01-02 ENCOUNTER — Ambulatory Visit (INDEPENDENT_AMBULATORY_CARE_PROVIDER_SITE_OTHER): Payer: BC Managed Care – PPO | Admitting: Cardiology

## 2013-01-02 VITALS — BP 124/78 | HR 72 | Ht 70.5 in | Wt 188.0 lb

## 2013-01-02 DIAGNOSIS — E785 Hyperlipidemia, unspecified: Secondary | ICD-10-CM

## 2013-01-02 DIAGNOSIS — I2510 Atherosclerotic heart disease of native coronary artery without angina pectoris: Secondary | ICD-10-CM

## 2013-01-02 DIAGNOSIS — C189 Malignant neoplasm of colon, unspecified: Secondary | ICD-10-CM

## 2013-01-02 DIAGNOSIS — I10 Essential (primary) hypertension: Secondary | ICD-10-CM

## 2013-01-02 LAB — BASIC METABOLIC PANEL
BUN: 14 mg/dL (ref 6–23)
CO2: 26 mEq/L (ref 19–32)
Chloride: 102 mEq/L (ref 96–112)
Creatinine, Ser: 0.7 mg/dL (ref 0.4–1.5)
Glucose, Bld: 89 mg/dL (ref 70–99)

## 2013-01-02 LAB — LIPID PANEL
Cholesterol: 144 mg/dL (ref 0–200)
Triglycerides: 54 mg/dL (ref 0.0–149.0)

## 2013-01-02 LAB — IRON: Iron: 115 ug/dL (ref 42–165)

## 2013-01-02 MED ORDER — ENALAPRIL MALEATE 5 MG PO TABS: 5.0000 mg | ORAL_TABLET | Freq: Every day | ORAL | Status: DC

## 2013-01-02 NOTE — Patient Instructions (Addendum)
Your physician recommends that you return for lab work today--Lipid profile/BMET/TIBC/IRON/Ferritin.   You have been referred to Dr Yetta Barre or Dr Felicity Coyer at [facility].  Your physician wants you to follow-up in: 6 months with Dr Shirlee Latch. (February 2015).  You will receive a reminder letter in the mail two months in advance. If you don't receive a letter, please call our office to schedule the follow-up appointment.

## 2013-01-03 NOTE — Progress Notes (Signed)
Patient ID: [name] [name], male   DOB: August 25, 1960, 52 y.o.   MRN: [id number] PCP: Dr. Vernell Leep [facility])  52 yo with history of CAD, HTN, and hyperlipidemia presents for cardiology followup.  Patient lives in [place] but works full time for [employer] in the [place] area.  He had a normal ETT-Cardiolite in 4/13, but in 4/14, he presented to [facility] with NSTEMI.  LHC showed 90% in-stent restenosis in D2 and 95% PLOM stenosis.  He had PTCA to D2 and Promus DES to [facility].  EF was 55% on LV-gram.   Since that time, he has been doing well.  No exertional chest pain or dyspnea.  He is quite active at work.  He did cardiac rehab for 4 weeks but had to quit due to his work schedule.  He has lost around 20 lbs by diet and exercise.    Labs (4/12): K 4.3, creatinine 0.7, LDL 82, HDL 41  PMH: 1. HTN 2. Hyperlipidemia 3. OSA: Uses CPAP 4. CAD: Unstable angina in 2007, DES to LAD and DES to diagonal (V stent technique so long-term Plavix).  Myoview (4/10): ? In [place], was normal per Dr. Regino Schultze notes.  ETT-Cardiolite (4/13) with 9' exercise, no evidence for ischemia or infarction, EF 60%.  NSTEMI 4/14 with LHC showing 90% in-stent restenosis in D2 and 95% PLOM stenosis.  He had PTCA to D2 and Promus DES to [facility].  EF was 55% on LV-gram.  5. Colon cancer: Treated with surgery and chemotherapy.  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  6. GERD  SH: Married with children.  Lives in [place], works for [employer] in [place].  Nonsmoker.    FH: Uncle with MI at 70, another uncle with MI at 58.   Current Outpatient Prescriptions  Medication Sig Dispense Refill  . amLODipine (NORVASC) 5 MG tablet Take 5 mg by mouth 2 (two) times daily.       Marland Kitchen aspirin 81 MG tablet Take 81 mg by mouth daily.      . clopidogrel (PLAVIX) 75 MG tablet Take 1 tablet (75 mg total) by mouth daily.  90 tablet  3  . DEXILANT 60 MG capsule Take 60 mg by mouth daily.       .  metoprolol tartrate (LOPRESSOR) 25 MG tablet Take 0.5 tablets (12.5 mg total) by mouth 2 (two) times daily.  30 tablet  6  . Multiple Vitamin (MULTIVITAMIN) tablet Take 1 tablet by mouth daily.        . nitroGLYCERIN (NITROSTAT) 0.4 MG SL tablet Place 1 tablet (0.4 mg total) under the tongue every 5 (five) minutes x 3 doses as needed for chest pain.  25 tablet  3  . omega-3 acid ethyl esters (LOVAZA) 1 G capsule Take 1 g by mouth 2 (two) times daily.       . rosuvastatin (CRESTOR) 20 MG tablet Take 20 mg by mouth daily.        . Cholecalciferol (VITAMIN D3) 2000 UNITS TABS Take by mouth daily.        . enalapril (VASOTEC) 5 MG tablet Take 1 tablet (5 mg total) by mouth daily.  180 tablet  3   No current facility-administered medications for this visit.    BP 124/78  Pulse 72  Ht 5' 10.5" (1.791 m)  Wt 85.276 kg (188 lb)  BMI 26.58 kg/m2 General: NAD Neck: No JVD, no thyromegaly or thyroid nodule.  Lungs: Clear to auscultation bilaterally with normal respiratory effort.  CV: Nondisplaced PMI.  Heart regular S1/S2, no S3/S4, no murmur.  No peripheral edema.  No carotid bruit.  Normal pedal pulses.  Abdomen: Soft, nontender, no hepatosplenomegaly, no distention.  Neurologic: Alert and oriented x 3.  Psych: Normal affect. Extremities: No clubbing or cyanosis.   Assessment/Plan: 1. CAD: S/p recent NSTEMI with PTCA to in-stent restenosis in D2 and DES to [facility].  He will need to continue Plavix x 1 year at least and ASA indefinitely.  He will continue on metoprolol, enalapril, and Crestor.  2. Hyperlipidemia: Check lipids today.   3. HTN: BP tends to rise when he is anxious but for the most part is now well-controlled on current regimen.   Marca Ancona 01/03/2013

## 2013-01-04 ENCOUNTER — Encounter (HOSPITAL_COMMUNITY): Payer: BC Managed Care – PPO

## 2013-01-04 ENCOUNTER — Telehealth: Payer: Self-pay | Admitting: Cardiology

## 2013-01-04 NOTE — Telephone Encounter (Signed)
Pt calling re test results, labs , mychart shows they are ready and he can't get them pulled up

## 2013-01-04 NOTE — Telephone Encounter (Signed)
Spoke with patient about lab results.

## 2013-01-05 ENCOUNTER — Other Ambulatory Visit: Payer: Self-pay | Admitting: *Deleted

## 2013-01-05 DIAGNOSIS — C189 Malignant neoplasm of colon, unspecified: Secondary | ICD-10-CM

## 2013-01-05 DIAGNOSIS — I2510 Atherosclerotic heart disease of native coronary artery without angina pectoris: Secondary | ICD-10-CM

## 2013-01-05 MED ORDER — ENALAPRIL MALEATE 5 MG PO TABS: 5.0000 mg | ORAL_TABLET | Freq: Two times a day (BID) | ORAL | Status: DC

## 2013-01-06 ENCOUNTER — Encounter (HOSPITAL_COMMUNITY): Payer: BC Managed Care – PPO

## 2013-01-09 ENCOUNTER — Telehealth: Payer: Self-pay

## 2013-01-09 NOTE — Telephone Encounter (Signed)
Message copied by Charline Bills on Mon Jan 09, 2013  4:25 PM ------      Message from: Laurey Morale      Created: Fri Jan 06, 2013  9:36 AM       Stay at Crestor 20 ------

## 2013-01-11 ENCOUNTER — Encounter (HOSPITAL_COMMUNITY): Payer: BC Managed Care – PPO

## 2013-01-13 ENCOUNTER — Encounter (HOSPITAL_COMMUNITY): Payer: BC Managed Care – PPO

## 2013-01-17 ENCOUNTER — Encounter: Payer: Self-pay | Admitting: Cardiology

## 2013-03-08 ENCOUNTER — Ambulatory Visit: Payer: BC Managed Care – PPO | Admitting: Internal Medicine

## 2013-03-23 ENCOUNTER — Other Ambulatory Visit: Payer: Self-pay

## 2013-09-12 ENCOUNTER — Observation Stay (HOSPITAL_COMMUNITY)
Admission: EM | Admit: 2013-09-12 | Discharge: 2013-09-12 | Disposition: A | Discharge status: Home / Self Care | Payer: BC Managed Care – PPO | Attending: Internal Medicine | Admitting: Internal Medicine

## 2013-09-12 ENCOUNTER — Observation Stay (HOSPITAL_COMMUNITY): Payer: BC Managed Care – PPO

## 2013-09-12 ENCOUNTER — Encounter (HOSPITAL_COMMUNITY): Payer: Self-pay | Admitting: Emergency Medicine

## 2013-09-12 ENCOUNTER — Encounter (HOSPITAL_COMMUNITY)
Admission: EM | Disposition: A | Discharge status: Home / Self Care | Payer: Self-pay | Source: Home / Self Care | Attending: Emergency Medicine

## 2013-09-12 DIAGNOSIS — Z7902 Long term (current) use of antithrombotics/antiplatelets: Secondary | ICD-10-CM | POA: Insufficient documentation

## 2013-09-12 DIAGNOSIS — M549 Dorsalgia, unspecified: Secondary | ICD-10-CM

## 2013-09-12 DIAGNOSIS — E785 Hyperlipidemia, unspecified: Secondary | ICD-10-CM | POA: Diagnosis present

## 2013-09-12 DIAGNOSIS — Z9221 Personal history of antineoplastic chemotherapy: Secondary | ICD-10-CM | POA: Insufficient documentation

## 2013-09-12 DIAGNOSIS — I10 Essential (primary) hypertension: Secondary | ICD-10-CM | POA: Diagnosis present

## 2013-09-12 DIAGNOSIS — Z9861 Coronary angioplasty status: Secondary | ICD-10-CM | POA: Insufficient documentation

## 2013-09-12 DIAGNOSIS — G4730 Sleep apnea, unspecified: Secondary | ICD-10-CM

## 2013-09-12 DIAGNOSIS — Z9049 Acquired absence of other specified parts of digestive tract: Secondary | ICD-10-CM | POA: Insufficient documentation

## 2013-09-12 DIAGNOSIS — K2270 Barrett's esophagus without dysplasia: Secondary | ICD-10-CM | POA: Insufficient documentation

## 2013-09-12 DIAGNOSIS — G4733 Obstructive sleep apnea (adult) (pediatric): Secondary | ICD-10-CM | POA: Diagnosis present

## 2013-09-12 DIAGNOSIS — K5730 Diverticulosis of large intestine without perforation or abscess without bleeding: Secondary | ICD-10-CM | POA: Insufficient documentation

## 2013-09-12 DIAGNOSIS — K219 Gastro-esophageal reflux disease without esophagitis: Secondary | ICD-10-CM

## 2013-09-12 DIAGNOSIS — K449 Diaphragmatic hernia without obstruction or gangrene: Secondary | ICD-10-CM | POA: Insufficient documentation

## 2013-09-12 DIAGNOSIS — Z85038 Personal history of other malignant neoplasm of large intestine: Secondary | ICD-10-CM | POA: Insufficient documentation

## 2013-09-12 DIAGNOSIS — Z85118 Personal history of other malignant neoplasm of bronchus and lung: Secondary | ICD-10-CM | POA: Insufficient documentation

## 2013-09-12 DIAGNOSIS — Z7982 Long term (current) use of aspirin: Secondary | ICD-10-CM | POA: Insufficient documentation

## 2013-09-12 DIAGNOSIS — I2510 Atherosclerotic heart disease of native coronary artery without angina pectoris: Principal | ICD-10-CM

## 2013-09-12 DIAGNOSIS — Z902 Acquired absence of lung [part of]: Secondary | ICD-10-CM | POA: Insufficient documentation

## 2013-09-12 DIAGNOSIS — Z9989 Dependence on other enabling machines and devices: Secondary | ICD-10-CM | POA: Diagnosis present

## 2013-09-12 DIAGNOSIS — I252 Old myocardial infarction: Secondary | ICD-10-CM | POA: Insufficient documentation

## 2013-09-12 DIAGNOSIS — C187 Malignant neoplasm of sigmoid colon: Secondary | ICD-10-CM | POA: Diagnosis present

## 2013-09-12 HISTORY — PX: LEFT HEART CATHETERIZATION WITH CORONARY ANGIOGRAM: SHX5451

## 2013-09-12 LAB — BASIC METABOLIC PANEL
BUN: 14 mg/dL (ref 6–23)
CALCIUM: 10 mg/dL (ref 8.4–10.5)
CHLORIDE: 104 meq/L (ref 96–112)
CO2: 26 meq/L (ref 19–32)
CREATININE: 0.75 mg/dL (ref 0.50–1.35)
GFR calc non Af Amer: >90 mL/min (ref >90)
Glucose, Bld: 115 mg/dL — ABNORMAL HIGH (ref 70–99)
Potassium: 4.3 mEq/L (ref 3.7–5.3)
SODIUM: 143 meq/L (ref 137–147)

## 2013-09-12 LAB — CBC WITH DIFFERENTIAL/PLATELET
Basophils Absolute: 0 10*3/uL (ref 0.0–0.1)
Basophils Relative: 0 % (ref 0–1)
EOS PCT: 1 % (ref 0–5)
Eosinophils Absolute: 0.1 10*3/uL (ref 0.0–0.7)
HEMATOCRIT: 45 % (ref 39.0–52.0)
Hemoglobin: 14.9 g/dL (ref 13.0–17.0)
LYMPHS PCT: 22 % (ref 12–46)
Lymphs Abs: 1.4 10*3/uL (ref 0.7–4.0)
MCH: 29.6 pg (ref 26.0–34.0)
MCHC: 33.1 g/dL (ref 30.0–36.0)
MCV: 89.5 fL (ref 78.0–100.0)
MONO ABS: 0.5 10*3/uL (ref 0.1–1.0)
Monocytes Relative: 8 % (ref 3–12)
Neutro Abs: 4.7 10*3/uL (ref 1.7–7.7)
Neutrophils Relative %: 69 % (ref 43–77)
PLATELETS: 235 10*3/uL (ref 150–400)
RBC: 5.03 MIL/uL (ref 4.22–5.81)
RDW: 13.3 % (ref 11.5–15.5)
WBC: 6.7 10*3/uL (ref 4.0–10.5)

## 2013-09-12 LAB — PLATELET INHIBITION P2Y12: PLATELET FUNCTION P2Y12: 223 [PRU] (ref 194–418)

## 2013-09-12 LAB — COMPREHENSIVE METABOLIC PANEL
ALBUMIN: 4 g/dL (ref 3.5–5.2)
ALT: 20 U/L (ref 0–53)
AST: 17 U/L (ref 0–37)
Alkaline Phosphatase: 72 U/L (ref 39–117)
BUN: 14 mg/dL (ref 6–23)
CO2: 26 mEq/L (ref 19–32)
CREATININE: 0.72 mg/dL (ref 0.50–1.35)
Calcium: 9.4 mg/dL (ref 8.4–10.5)
Chloride: 103 mEq/L (ref 96–112)
GFR calc Af Amer: >90 mL/min (ref >90)
GFR calc non Af Amer: >90 mL/min (ref >90)
Glucose, Bld: 106 mg/dL — ABNORMAL HIGH (ref 70–99)
Potassium: 4.3 mEq/L (ref 3.7–5.3)
Sodium: 141 mEq/L (ref 137–147)
TOTAL PROTEIN: 7 g/dL (ref 6.0–8.3)
Total Bilirubin: 0.4 mg/dL (ref 0.3–1.2)

## 2013-09-12 LAB — TROPONIN I: Troponin I: <0.3 ng/mL (ref <0.30)

## 2013-09-12 LAB — I-STAT TROPONIN, ED
TROPONIN I, POC: 0.01 ng/mL (ref 0.00–0.08)
Troponin i, poc: 0 ng/mL (ref 0.00–0.08)

## 2013-09-12 LAB — TSH: TSH: 1.78 u[IU]/mL (ref 0.350–4.500)

## 2013-09-12 LAB — CBC
HCT: 46.8 % (ref 39.0–52.0)
Hemoglobin: 16.6 g/dL (ref 13.0–17.0)
MCH: 31.1 pg (ref 26.0–34.0)
MCHC: 35.5 g/dL (ref 30.0–36.0)
MCV: 87.8 fL (ref 78.0–100.0)
PLATELETS: 262 10*3/uL (ref 150–400)
RBC: 5.33 MIL/uL (ref 4.22–5.81)
RDW: 13.1 % (ref 11.5–15.5)
WBC: 9.7 10*3/uL (ref 4.0–10.5)

## 2013-09-12 LAB — PROTIME-INR
INR: 1.05 (ref 0.00–1.49)
Prothrombin Time: 13.5 seconds (ref 11.6–15.2)

## 2013-09-12 SURGERY — LEFT HEART CATHETERIZATION WITH CORONARY ANGIOGRAM
Anesthesia: LOCAL

## 2013-09-12 MED ORDER — ASPIRIN EC 81 MG PO TBEC: 81.0000 mg | DELAYED_RELEASE_TABLET | Freq: Every day | ORAL | Status: DC

## 2013-09-12 MED ORDER — ASPIRIN 81 MG PO TBEC: 81.0000 mg | DELAYED_RELEASE_TABLET | Freq: Every day | ORAL | Status: DC

## 2013-09-12 MED ORDER — ATORVASTATIN CALCIUM 40 MG PO TABS
40.0000 mg | ORAL_TABLET | Freq: Every day | ORAL | Status: DC
  Administered 2013-09-12: 40 mg via ORAL
  Filled 2013-09-12: qty 1

## 2013-09-12 MED ORDER — FENTANYL CITRATE 0.05 MG/ML IJ SOLN
INTRAMUSCULAR | Status: AC
  Filled 2013-09-12: qty 2

## 2013-09-12 MED ORDER — LIDOCAINE HCL (PF) 1 % IJ SOLN
INTRAMUSCULAR | Status: AC
  Filled 2013-09-12: qty 30

## 2013-09-12 MED ORDER — MIDAZOLAM HCL 2 MG/2ML IJ SOLN
INTRAMUSCULAR | Status: AC
  Filled 2013-09-12: qty 2

## 2013-09-12 MED ORDER — NITROGLYCERIN 0.2 MG/ML ON CALL CATH LAB
INTRAVENOUS | Status: AC
  Filled 2013-09-12: qty 1

## 2013-09-12 MED ORDER — CLOPIDOGREL BISULFATE 75 MG PO TABS
75.0000 mg | ORAL_TABLET | Freq: Every day | ORAL | Status: DC
  Administered 2013-09-12: 75 mg via ORAL
  Filled 2013-09-12 (×2): qty 1

## 2013-09-12 MED ORDER — NITROGLYCERIN 0.4 MG SL SUBL: 0.4000 mg | SUBLINGUAL_TABLET | SUBLINGUAL | Status: DC | PRN

## 2013-09-12 MED ORDER — SODIUM CHLORIDE 0.9 % IJ SOLN: 3.0000 mL | Freq: Two times a day (BID) | INTRAMUSCULAR | Status: DC

## 2013-09-12 MED ORDER — SODIUM CHLORIDE 0.9 % IV SOLN: INTRAVENOUS | Status: DC

## 2013-09-12 MED ORDER — ENALAPRIL MALEATE 5 MG PO TABS
5.0000 mg | ORAL_TABLET | Freq: Two times a day (BID) | ORAL | Status: DC
  Administered 2013-09-12: 5 mg via ORAL
  Filled 2013-09-12 (×2): qty 1

## 2013-09-12 MED ORDER — METOPROLOL TARTRATE 12.5 MG HALF TABLET
12.5000 mg | ORAL_TABLET | Freq: Two times a day (BID) | ORAL | Status: DC
  Administered 2013-09-12 (×2): 12.5 mg via ORAL
  Filled 2013-09-12 (×2): qty 1

## 2013-09-12 MED ORDER — ONDANSETRON HCL 4 MG/2ML IJ SOLN: 4.0000 mg | Freq: Three times a day (TID) | INTRAMUSCULAR | Status: AC | PRN

## 2013-09-12 MED ORDER — SODIUM CHLORIDE 0.9 % IJ SOLN: 3.0000 mL | INTRAMUSCULAR | Status: DC | PRN

## 2013-09-12 MED ORDER — ENOXAPARIN SODIUM 40 MG/0.4ML ~~LOC~~ SOLN
40.0000 mg | SUBCUTANEOUS | Status: DC
  Administered 2013-09-12: 40 mg via SUBCUTANEOUS
  Filled 2013-09-12: qty 0.4

## 2013-09-12 MED ORDER — EZETIMIBE 10 MG PO TABS
10.0000 mg | ORAL_TABLET | Freq: Every day | ORAL | Status: DC
  Administered 2013-09-12: 10 mg via ORAL
  Filled 2013-09-12: qty 1

## 2013-09-12 MED ORDER — ASPIRIN 81 MG PO CHEW: 81.0000 mg | CHEWABLE_TABLET | ORAL | Status: AC

## 2013-09-12 MED ORDER — AMLODIPINE BESYLATE 5 MG PO TABS
5.0000 mg | ORAL_TABLET | Freq: Two times a day (BID) | ORAL | Status: DC
  Administered 2013-09-12 (×2): 5 mg via ORAL
  Filled 2013-09-12 (×2): qty 1

## 2013-09-12 MED ORDER — ASPIRIN 81 MG PO CHEW: 81.0000 mg | CHEWABLE_TABLET | ORAL | Status: DC

## 2013-09-12 MED ORDER — CLOPIDOGREL BISULFATE 75 MG PO TABS: 75.0000 mg | ORAL_TABLET | Freq: Every day | ORAL | Status: DC

## 2013-09-12 MED ORDER — ASPIRIN 325 MG PO TABS
325.0000 mg | ORAL_TABLET | Freq: Two times a day (BID) | ORAL | Status: DC
  Administered 2013-09-12: 325 mg via ORAL
  Filled 2013-09-12 (×2): qty 1

## 2013-09-12 MED ORDER — DIAZEPAM 5 MG PO TABS
5.0000 mg | ORAL_TABLET | ORAL | Status: AC
  Administered 2013-09-12: 5 mg via ORAL
  Filled 2013-09-12: qty 1

## 2013-09-12 MED ORDER — ENALAPRIL MALEATE 10 MG PO TABS
10.0000 mg | ORAL_TABLET | Freq: Two times a day (BID) | ORAL | Status: DC
  Administered 2013-09-12: 10 mg via ORAL
  Filled 2013-09-12: qty 1

## 2013-09-12 MED ORDER — OMEGA-3-ACID ETHYL ESTERS 1 G PO CAPS
1.0000 g | ORAL_CAPSULE | Freq: Two times a day (BID) | ORAL | Status: DC
  Administered 2013-09-12 (×2): 1 g via ORAL
  Filled 2013-09-12 (×2): qty 1

## 2013-09-12 MED ORDER — SODIUM CHLORIDE 0.9 % IV SOLN
250.0000 mL | INTRAVENOUS | Status: DC
  Administered 2013-09-12: 14:00:00 via INTRAVENOUS

## 2013-09-12 MED ORDER — PANTOPRAZOLE SODIUM 40 MG PO TBEC
40.0000 mg | DELAYED_RELEASE_TABLET | Freq: Every day | ORAL | Status: DC
  Filled 2013-09-12: qty 1

## 2013-09-12 MED ORDER — ALPRAZOLAM 0.25 MG PO TABS: 0.2500 mg | ORAL_TABLET | Freq: Two times a day (BID) | ORAL | Status: DC | PRN

## 2013-09-12 MED ORDER — HEPARIN (PORCINE) IN NACL 2-0.9 UNIT/ML-% IJ SOLN
INTRAMUSCULAR | Status: AC
  Filled 2013-09-12: qty 1000

## 2013-09-12 NOTE — ED Notes (Addendum)
Pt reports that his bp has been elevated since Thursday (944'H systolic) and upper back pressure. Pt repots he had similar pain last year when he had MI with stent placement. Pt also reports episode of shaking that lasted 30 mins on and off and bp of 197/107 at that time, and having some dizziness. Pt denies cp, sob, nausea. Pt also reports strenuous activity this weekend. Pt took 2 asa pta.

## 2013-09-12 NOTE — Interval H&P Note (Signed)
Cath Lab Visit (complete for each Cath Lab visit)  Clinical Evaluation Leading to the Procedure:   ACS: no  Non-ACS:    Anginal Classification: CCS III  Anti-ischemic medical therapy: Maximal Therapy (2 or more classes of medications)  Non-Invasive Test Results: No non-invasive testing performed  Prior CABG: No previous CABG      History and Physical Interval Note:  09/12/2013 2:23 PM  [name] [name]  has presented today for surgery, with the diagnosis of cp  The various methods of treatment have been discussed with the patient and family. After consideration of risks, benefits and other options for treatment, the patient has consented to  Procedure(s): LEFT HEART CATHETERIZATION WITH CORONARY ANGIOGRAM (N/A) as a surgical intervention .  The patient's history has been reviewed, patient examined, no change in status, stable for surgery.  I have reviewed the patient's chart and labs.  Questions were answered to the patient's satisfaction.     Troy Sine

## 2013-09-12 NOTE — Progress Notes (Signed)
UR Completed.  Tamiya Colello Jane Brodie Scovell [phone] 09/12/2013  

## 2013-09-12 NOTE — Progress Notes (Signed)
Pt/family given discharge instructions, medication list, follow-up appointments, and when to call the doctor. Pt/family verbalize understanding. Night time medications given to patient.  Reinforced education given by day shift RN. All questions answered. Right groin site level zero with no signs of bleeding.  Pt ambulated with no complaints.  VSS, IV taken out and patient escorted out with wife.  All pt belongings with wife.

## 2013-09-12 NOTE — ED Notes (Signed)
Patient transported to X-ray 

## 2013-09-12 NOTE — CV Procedure (Signed)
[name] [name] is a 53 y.o. male    [id number]  [id number] LOCATION:  FACILITY: [facility]  PHYSICIAN: Troy Sine, MD, [facility] 06-20-1960   DATE OF PROCEDURE:  09/12/2013    CARDIAC CATHETERIZATION     HISTORY:   [name] [name] is a 53 y.o. male with a past medical history significant for colon CA, status post resection and chemotherapy 17 years ago with recurrent lung lesion one year later who has known CAD.  He underwent initial intervention 8 years ago with stenting of his LAD and second diagonal vessel. In April 2014, he presented with a non-ST segment elevation myocardial infarction with symptoms of upper back tightness and pressure was found to have a patent stent in his mid LAD immediately after the second diagonal,, the first diagonal vessel, which was very small in caliber had 80% stenosis at its origin, and a large second diagonal vessel had 90% in-stent restenosis in the proximal vessel. There was 20% plaque in the RCA in the mid-distal region, but the posterolateral branch had 95% focal stenosis proximally. He underwent two-vessel intervention by Dr. Martinique with Angiosculpt scoring cutting balloon, and PTCA/insertion of a 2.25x12 mm Promus Premier DES stent in the PLA vessel. He states his back discomfort significantly improved. Over the past several weeks, he has had difficulty with blood pressure lability. He also has started to notice upper back tightness whichwas similar to the back discomfort that he fell one year ago. He was admitted and evening by the hospitalist service. Initial ECG did not demonstrate significant change. Initial troponin is normal.  Options concerning evaluation were discussed with the patient and he preferred definitive reevaluation with diagnostic.    PROCEDURE: Left heart catheterization: Coronary angiography; left ventriculography  The patient was brought to the second floor [facility]iac cath lab in the postabsorptive state. He was premedicated with a  total of Versed 3 mg and fentanyl 75 mcg.  Since there was difficulty with right radial access at his catheterization one year ago, the femoral approach was utilized today as was utilized for his catheterization last year.  His was prepped and shaved in usual sterile fashion. Xylocaine 1% was used for local anesthesia. A 5 French sheath was inserted into the right femoral artery. Diagnostic catheterizatiion was done utlizing 5 [place]  JL4, JR4, and pigtail catheters. Left ventriculography was done with 25 cc Omnipaque contrast. Hemostasis was obtained by direct manual compression. The patient tolerated the procedure well.  He returned to his room, chest pain-free with stable hemodynamics.     HEMODYNAMICS:   Central Aorta: 120/68   Left Ventricle: 120/13  ANGIOGRAPHY:  Left main: Angiographically normal and bifurcated into LAD and the circumflex system.   LAD: Moderate size vessel that gave rise to a small, diminutive first diagonal vessel.  The LAD after the takeoff of the second diagonal vessel had a widely patent stent.  The stent was also in the ostial proximal portion of the second diagonal vessel.  There was mild, smooth, 20-30% proximal narrowing , which was not felt to be significant in the proximal portion of the stented segment.  Left circumflex: Angiographically normal vessel, which gave rise to 2 marginal branches.   Right coronary artery: Moderate size vessel that had previously noted 20-30% mid smooth narrowing.  There was 20% ostial stenosis of the PDA branch.  The stent, which was previously placed to the posterolateral coronary artery was widely patent without evidence for in-stent restenosis.  Left ventriculography performed in the RAO  projection revealed and contractility without focal, segmental wall motion abnormalities.  Ejection fraction is 55%.  There was no mitral regurgitation.  Total contrast used: 70 cc Omnipaque  IMPRESSION:  Normal LV function with an ejection  fraction of 55%  No significant residual coronary obstructive disease with evidence for a widely patent mid LAD stent after the takeoff of the second diagonal vessel with 20-30% mild narrowing in the proximal segment of the in the diagonal vessel; normal left circumflex coronary artery; mild 20-30% mid RCA stenosis with 20% ostial PDA stenosis and a widely patent stent in the PLA branch of the right coronary artery.  RECOMMENDATION:  Medical therapy with continued antiplatelet therapy, optimal blood pressure control, and continued aggressive treatment of hyperlipidemia.  Troy Sine, MD, [facility] 09/12/2013 3:02 PM

## 2013-09-12 NOTE — Progress Notes (Signed)
Physician Discharge Summary  [name] [name] HBZ:[id number] DOB: 1961/04/26 DOA: 09/12/2013  PCP: Tobe Sos, MD  Admit date: 09/12/2013 Discharge date: 09/12/2013  Time spent: 35  minutes  Recommendations for Outpatient Follow-up:  Cardiology recommending medical therapy with continued antiplatelet therapy, optimal blood pressure control, and continued aggressive treatment of hyperlipidemia  Discharge Diagnoses:  Principal Problem:   CAD (coronary artery disease) Active Problems:   ADENOCARCINOMA, SIGMOID COLON   SLEEP APNEA   HTN (hypertension)   Back pain   HLD (hyperlipidemia)   Discharge Condition: Stable  Diet recommendation: Heart Healthy  Filed Weights   09/12/13 0029 09/12/13 0507 09/12/13 1300  Weight: 90.719 kg (200 lb) 89.4 kg (197 lb 1.5 oz) 87.181 kg (192 lb 3.2 oz)    History of present illness:  [name] [name] is a 53 y.o. male with history of non-ST elevation MI last year with stent placement, hypertension, hyperlipidemia sleep apnea presents to the ER because of concerns of uncontrolled blood pressure and having increasing upper back pain for last few days. Patient upper back pain lasts a few minutes each time and has no relation to exertion and denies any diaphoresis palpitations shortness of breath. Patient states he had similar pain last year when he had non-ST elevation MI. Patient presently chest pain-free. Denies any nausea vomiting abdominal pain or diarrhea. Cardiac markers and EKG chest x-ray were unremarkable. Patient states recently his blood pressure has been running high in the 160s.    Hospital Course:  Patient is a pleasant 53 year old gentleman with an established history of coronary artery disease status post percutaneous intervention admitted to the medicine service on 09/12/2013 presenting with complaints of back tightness and pressure. It appears he had a similar prep presentation back in April of 2014 at which time he was found to  have an ST segment elevation myocardial infarction and found to have 90% in-stent restenosis in the proximal vessel. EKG did not reveal ischemic changes, troponins were cycled and remained negative. Cardiology was consulted as patient was evaluated by Dr. Claiborne Billings. He was taken to the cath lab on 09/12/2013 where cardiac catheterization did not reveal significant residual coronary obstructive disease, having widely patent mid LAD stent as well as widely patent stent in the PLA branch of the right coronary artery. Patient tolerated procedure well. It is possible that symptoms may have been secondary to anxiety. He was discharged in stable condition on 09/12/2048  Procedures: Cardiac catheterization performed on 09/12/2013 Impression:  Normal LV function with an ejection fraction of 55%  No significant residual coronary obstructive disease with evidence for a widely patent mid LAD stent after the takeoff of the second diagonal vessel with 20-30% mild narrowing in the proximal segment of the in the diagonal vessel; normal left circumflex coronary artery; mild 20-30% mid RCA stenosis with 20% ostial PDA stenosis and a widely patent stent in the PLA branch of the right coronary artery.   Consultations:  Cardiology  Discharge Exam: Filed Vitals:   09/12/13 1630  BP: 123/80  Pulse: 69  Temp:   Resp: 20    General: Patient is in no acute distress, states feeling better, denies chest pain or shortness of breath Cardiovascular: Regular rate rhythm normal S1-S2 Respiratory: Clear to auscultation bilaterally Abdomen: Soft nontender nondistended  Discharge Instructions You were cared for by a hospitalist during your hospital stay. If you have any questions about your discharge medications or the care you received while you were in the hospital after you are discharged, you  can call the unit and asked to speak with the hospitalist on call if the hospitalist that took care of you is not available. Once you  are discharged, your primary care physician will handle any further medical issues. Please note that NO REFILLS for any discharge medications will be authorized once you are discharged, as it is imperative that you return to your primary care physician (or establish a relationship with a primary care physician if you do not have one) for your aftercare needs so that they can reassess your need for medications and monitor your lab values.      Discharge Orders   Future Orders Complete By Expires   Call MD for:  difficulty breathing, headache or visual disturbances  As directed    Call MD for:  extreme fatigue  As directed    Call MD for:  persistant nausea and vomiting  As directed    Call MD for:  redness, tenderness, or signs of infection (pain, swelling, redness, odor or green/yellow discharge around incision site)  As directed    Call MD for:  severe uncontrolled pain  As directed    Call MD for:  temperature >100.4  As directed    Diet - low sodium heart healthy  As directed    Increase activity slowly  As directed        Medication List    STOP taking these medications       aspirin 325 MG tablet  Replaced by:  aspirin 81 MG EC tablet      TAKE these medications       ALPRAZolam 0.25 MG tablet  Commonly known as:  XANAX  Take 1 tablet (0.25 mg total) by mouth 2 (two) times daily as needed for anxiety.     amLODipine 5 MG tablet  Commonly known as:  NORVASC  Take 5 mg by mouth 2 (two) times daily.     aspirin 81 MG EC tablet  Take 1 tablet (81 mg total) by mouth daily.  Start taking on:  09/13/2013     clopidogrel 75 MG tablet  Commonly known as:  PLAVIX  Take 1 tablet (75 mg total) by mouth daily.     DEXILANT 60 MG capsule  Generic drug:  dexlansoprazole  Take 60 mg by mouth daily.     enalapril 5 MG tablet  Commonly known as:  VASOTEC  Take 1 tablet (5 mg total) by mouth 2 (two) times daily.     ezetimibe 10 MG tablet  Commonly known as:  ZETIA  Take 10 mg  by mouth daily.     metoprolol tartrate 25 MG tablet  Commonly known as:  LOPRESSOR  Take 0.5 tablets (12.5 mg total) by mouth 2 (two) times daily.     nitroGLYCERIN 0.4 MG SL tablet  Commonly known as:  NITROSTAT  Place 1 tablet (0.4 mg total) under the tongue every 5 (five) minutes x 3 doses as needed for chest pain.     omega-3 acid ethyl esters 1 G capsule  Commonly known as:  LOVAZA  Take 1 g by mouth 2 (two) times daily.     rosuvastatin 20 MG tablet  Commonly known as:  CRESTOR  Take 20 mg by mouth daily.       No Known Allergies Follow-up Information   Follow up with Tobe Sos, MD In 1 week.   Specialty:  Internal Medicine   Contact information:   [address], [address] [place] [address]  [phone]        The results of significant diagnostics from this hospitalization (including imaging, microbiology, ancillary and laboratory) are listed below for reference.    Significant Diagnostic Studies: Dg Chest 2 View  09/12/2013   CLINICAL DATA:  Hypertension.  EXAM: CHEST  2 VIEW  COMPARISON:  DG CHEST 2 VIEW dated 08/28/2012  FINDINGS: Cardiomediastinal silhouette is unremarkable. The lungs are clear without pleural effusions or focal consolidations. Persistent mildly elevated left hemidiaphragm with scarring. Coronary artery stents versus pulmonary surgical material. Trachea projects midline and there is no pneumothorax. Soft tissue planes and included osseous structures are non-suspicious.  IMPRESSION: No acute cardiopulmonary process.   Electronically Signed   By: Elon Alas   On: 09/12/2013 04:43    Microbiology: No results found for this or any previous visit (from the past 240 hour(s)).   Labs: Basic Metabolic Panel:  Recent Labs Lab 09/12/13 0048 09/12/13 0756  NA 143 141  K 4.3 4.3  CL 104 103  CO2 26 26  GLUCOSE 115* 106*  BUN 14 14  CREATININE 0.75 0.72  CALCIUM 10.0 9.4   Liver Function Tests:  Recent Labs Lab 09/12/13 0756   AST 17  ALT 20  ALKPHOS 72  BILITOT 0.4  PROT 7.0  ALBUMIN 4.0   No results found for this basename: LIPASE, AMYLASE,  in the last 168 hours No results found for this basename: AMMONIA,  in the last 168 hours CBC:  Recent Labs Lab 09/12/13 0048 09/12/13 0756  WBC 9.7 6.7  NEUTROABS  --  4.7  HGB 16.6 14.9  HCT 46.8 45.0  MCV 87.8 89.5  PLT 262 235   Cardiac Enzymes:  Recent Labs Lab 09/12/13 0756 09/12/13 1620  TROPONINI <0.30 <0.30   BNP: BNP (last 3 results) No results found for this basename: PROBNP,  in the last 8760 hours CBG: No results found for this basename: GLUCAP,  in the last 168 hours     Signed:  [facility] 09/12/2013, 6:03 PM

## 2013-09-12 NOTE — H&P (Signed)
[facility] History and Physical  VEDH PTACEK ZOX:[id number] DOB: Jun 26, 1960 DOA: 09/12/2013  Referring physician: ER physician. PCP: Tobe Sos, MD   Chief Complaint: Upper back pain uncontrolled hypertension.  HPI: [name] [name] is a 53 y.o. male with history of non-ST elevation MI last year with stent placement, hypertension, hyperlipidemia sleep apnea presents to the ER because of concerns of uncontrolled blood pressure and having increasing upper back pain for last few days. Patient upper back pain lasts a few minutes each time and has no relation to exertion and denies any diaphoresis palpitations shortness of breath. Patient states he had similar pain last year when he had non-ST elevation MI. Patient presently chest pain-free. Denies any nausea vomiting abdominal pain or diarrhea. Cardiac markers and EKG chest x-ray were unremarkable. Patient states recently his blood pressure has been running high in the 160s.   Review of Systems: As presented in the history of presenting illness, rest negative.  Past Medical History  Diagnosis Date  . BARRETT'S ESOPHAGUS, HX OF   . HYPERTENSION   . DYSLIPIDEMIA   . CAD     a. 2007: DES to LAD and Diag;  b. 08/2011 non-ischemic myoview, EF 60%;  c. 08/2012 NSTEMI/Cath/PCI: LM n, LAD patent stent, D1 small, 80, D2 90 ISR (PTCA), LCX nl, RCA 20d, RPL 95 (2.25x12 Promus Premier DES), EF 55-65%.  Marland Kitchen SLEEP APNEA   . Secondary malignant neoplasm of lung     a. s/p wedge resection  . ADENOCARCINOMA, SIGMOID COLON     a. s/p resection/chemo  . HIATAL HERNIA WITH REFLUX   . GERD (gastroesophageal reflux disease)   . Diverticulosis   . Hyperplastic colon polyp   . Allergy     SEASONAL  . Anginal pain    Past Surgical History  Procedure Laterality Date  . Coronary stent placement  2007    taxus  . Colon resection  1998  . Tonsillectomy    . Lung removal, partial      LEFT  . Knee surgery    . Colon surgery     Social  History:  reports that he has never smoked. He has never used smokeless tobacco. He reports that he does not drink alcohol or use illicit drugs. Where does patient live home. Can patient participate in ADLs? Yes.  No Known Allergies  Family History:  Family History  Problem Relation Age of Onset  . Heart disease Mother     alive @ 24.  s/p cabg  . Colon polyps Mother   . Heart disease Father     alive @ 59.  s/p stenting  . Colon cancer Neg Hx   . Heart attack Paternal Uncle     @ 45  . Heart attack Paternal Uncle     @ 33      Prior to Admission medications   Medication Sig Start Date End Date Taking? Authorizing Provider  amLODipine (NORVASC) 5 MG tablet Take 5 mg by mouth 2 (two) times daily.    Yes Historical Provider, MD  aspirin 325 MG tablet Take 325 mg by mouth 2 (two) times daily.   Yes Historical Provider, MD  clopidogrel (PLAVIX) 75 MG tablet Take 1 tablet (75 mg total) by mouth daily. 08/23/12  Yes Rogelia Mire, NP  DEXILANT 60 MG capsule Take 60 mg by mouth daily.  07/19/11  Yes Historical Provider, MD  enalapril (VASOTEC) 5 MG tablet Take 1 tablet (5 mg total) by mouth 2 (two)  times daily. 01/05/13  Yes Larey Dresser, MD  ezetimibe (ZETIA) 10 MG tablet Take 10 mg by mouth daily.   Yes Historical Provider, MD  metoprolol tartrate (LOPRESSOR) 25 MG tablet Take 0.5 tablets (12.5 mg total) by mouth 2 (two) times daily. 08/23/12  Yes Rogelia Mire, NP  nitroGLYCERIN (NITROSTAT) 0.4 MG SL tablet Place 1 tablet (0.4 mg total) under the tongue every 5 (five) minutes x 3 doses as needed for chest pain. 08/23/12  Yes Rogelia Mire, NP  omega-3 acid ethyl esters (LOVAZA) 1 G capsule Take 1 g by mouth 2 (two) times daily.    Yes Historical Provider, MD  rosuvastatin (CRESTOR) 20 MG tablet Take 20 mg by mouth daily.     Yes Historical Provider, MD    Physical Exam: Filed Vitals:   09/12/13 0029 09/12/13 0330 09/12/13 0400 09/12/13 0507  BP: 149/94 112/78 136/93  141/85  Pulse: 87 58 60 76  Temp: 98.2 F (36.8 C)   97.8 F (36.6 C)  TempSrc: Oral   Oral  Resp: 16 18 15 17   Height: 5' 3.5" (1.613 m)   5\' 11"  (1.803 m)  Weight: 90.719 kg (200 lb)   89.4 kg (197 lb 1.5 oz)  SpO2: 100% 97% 99% 100%     General:  Well-developed and nourished.  Eyes: Anicteric no pallor.  ENT: No discharge from ears eyes nose mouth.  Neck: No mass felt.  Cardiovascular: S1-S2 heard.  Respiratory: No rhonchi or crepitations.  Abdomen: Soft nontender bowel sounds present. No guarding or rigidity.  Skin: No rash.  Musculoskeletal: No edema.  Psychiatric: Appears normal.  Neurologic: Alert awake oriented to time place and person. Moves all extremities.  Labs on Admission:  Basic Metabolic Panel:  Recent Labs Lab 09/12/13 0048  NA 143  K 4.3  CL 104  CO2 26  GLUCOSE 115*  BUN 14  CREATININE 0.75  CALCIUM 10.0   Liver Function Tests: No results found for this basename: AST, ALT, ALKPHOS, BILITOT, PROT, ALBUMIN,  in the last 168 hours No results found for this basename: LIPASE, AMYLASE,  in the last 168 hours No results found for this basename: AMMONIA,  in the last 168 hours CBC:  Recent Labs Lab 09/12/13 0048  WBC 9.7  HGB 16.6  HCT 46.8  MCV 87.8  PLT 262   Cardiac Enzymes: No results found for this basename: CKTOTAL, CKMB, CKMBINDEX, TROPONINI,  in the last 168 hours  BNP (last 3 results) No results found for this basename: PROBNP,  in the last 8760 hours CBG: No results found for this basename: GLUCAP,  in the last 168 hours  Radiological Exams on Admission: Dg Chest 2 View  09/12/2013   CLINICAL DATA:  Hypertension.  EXAM: CHEST  2 VIEW  COMPARISON:  DG CHEST 2 VIEW dated 08/28/2012  FINDINGS: Cardiomediastinal silhouette is unremarkable. The lungs are clear without pleural effusions or focal consolidations. Persistent mildly elevated left hemidiaphragm with scarring. Coronary artery stents versus pulmonary surgical material.  Trachea projects midline and there is no pneumothorax. Soft tissue planes and included osseous structures are non-suspicious.  IMPRESSION: No acute cardiopulmonary process.   Electronically Signed   By: Elon Alas   On: 09/12/2013 04:43    EKG: Independently reviewed. Normal sinus rhythm.  Assessment/Plan Principal Problem:   CAD (coronary artery disease) Active Problems:   SLEEP APNEA   HTN (hypertension)   Back pain   HLD (hyperlipidemia)   1. Upper back pain with  history of CAD status post stenting concerning for ACS - patient has been kept n.p.o. except medications in anticipation of possible procedure. Continue aspirin and Plavix. Cycle cardiac markers. Continue statins. Nitroglycerin when necessary. 2. Hypertension - continue present medications. 3. Sleep apnea - CPAP at bedtime. 4. Hyperlipidemia - continue statins. 5. History of colon cancer.    Code Status: Full code.  Family Communication: Family at the bedside.  Disposition Plan: Admit for observation.    [facility] Pager [phone].  If 7PM-7AM, please contact night-coverage [email or web address] Password [email or web address] 09/12/2013, 6:20 AM

## 2013-09-12 NOTE — ED Provider Notes (Signed)
CSN: [id number]     Arrival date & time 09/12/13  0013 History   First MD Initiated Contact with Patient 09/12/13 [id number]     Chief Complaint  Patient presents with  . Hypertension     (Consider location/radiation/quality/duration/timing/severity/associated sxs/prior Treatment) HPI Comments: 53 year old male with high blood pressure, CAD, cardiac stents, Plavix, and nSTEMI presents with high blood pressure and right upper back discomfort. Patient has had gradually worsening blood pressure up in the 660-630 systolic despite taking the medicines as directed. Patient is on calcium channel blocker and another medicine. Prior to arrival patient had an episode of right upper back discomfort with blood pressure 160F systolic. Patient had mild shaking bilateral but no seizure, patient was walking afterwards and no postictal period no history of seizures. No headache chest pain or shortness of breath. Patient follows with Dr. Algernon Huxley cardiology. Patient had increased activity this weekend without chest pain or shortness of breath however the challenges his small heart attack symptoms with the same as this with elevated blood pressure and right back pain. Patient denies injury however has been working with his right arm or recently.  Patient is a 53 y.o. male presenting with hypertension. The history is provided by the patient.  Hypertension This is a recurrent problem. Pertinent negatives include no chest pain, no abdominal pain, no headaches and no shortness of breath.    Past Medical History  Diagnosis Date  . BARRETT'S ESOPHAGUS, HX OF   . HYPERTENSION   . DYSLIPIDEMIA   . CAD     a. 2007: DES to LAD and Diag;  b. 08/2011 non-ischemic myoview, EF 60%;  c. 08/2012 NSTEMI/Cath/PCI: LM n, LAD patent stent, D1 small, 80, D2 90 ISR (PTCA), LCX nl, RCA 20d, RPL 95 (2.25x12 Promus Premier DES), EF 55-65%.  Marland Kitchen SLEEP APNEA   . Secondary malignant neoplasm of lung     a. s/p wedge resection  . ADENOCARCINOMA,  SIGMOID COLON     a. s/p resection/chemo  . HIATAL HERNIA WITH REFLUX   . GERD (gastroesophageal reflux disease)   . Diverticulosis   . Hyperplastic colon polyp   . Allergy     SEASONAL  . Anginal pain    Past Surgical History  Procedure Laterality Date  . Coronary stent placement  2007    taxus  . Colon resection  1998  . Tonsillectomy    . Lung removal, partial      LEFT  . Knee surgery    . Colon surgery     Family History  Problem Relation Age of Onset  . Heart disease Mother     alive @ 52.  s/p cabg  . Colon polyps Mother   . Heart disease Father     alive @ 74.  s/p stenting  . Colon cancer Neg Hx   . Heart attack Paternal Uncle     @ 97  . Heart attack Paternal Uncle     @ 53   History  Substance Use Topics  . Smoking status: Never Smoker   . Smokeless tobacco: Never Used  . Alcohol Use: No     Comment: occas has 4 oz of red wine.    Review of Systems  Constitutional: Positive for fatigue. Negative for fever and chills.  HENT: Negative for congestion.   Eyes: Negative for visual disturbance.  Respiratory: Negative for shortness of breath.   Cardiovascular: Negative for chest pain.  Gastrointestinal: Negative for vomiting and abdominal pain.  Genitourinary: Negative for dysuria  and flank pain.  Musculoskeletal: Positive for back pain. Negative for neck pain and neck stiffness.  Skin: Negative for rash.  Neurological: Positive for light-headedness. Negative for headaches.      Allergies  Review of patient's allergies indicates no known allergies.  Home Medications   Prior to Admission medications   Medication Sig Start Date End Date Taking? Authorizing Provider  amLODipine (NORVASC) 5 MG tablet Take 5 mg by mouth 2 (two) times daily.    Yes Historical Provider, MD  aspirin 325 MG tablet Take 325 mg by mouth 2 (two) times daily.   Yes Historical Provider, MD  clopidogrel (PLAVIX) 75 MG tablet Take 1 tablet (75 mg total) by mouth daily. 08/23/12   Yes Rogelia Mire, NP  DEXILANT 60 MG capsule Take 60 mg by mouth daily.  07/19/11  Yes Historical Provider, MD  enalapril (VASOTEC) 5 MG tablet Take 1 tablet (5 mg total) by mouth 2 (two) times daily. 01/05/13  Yes Larey Dresser, MD  ezetimibe (ZETIA) 10 MG tablet Take 10 mg by mouth daily.   Yes Historical Provider, MD  metoprolol tartrate (LOPRESSOR) 25 MG tablet Take 0.5 tablets (12.5 mg total) by mouth 2 (two) times daily. 08/23/12  Yes Rogelia Mire, NP  nitroGLYCERIN (NITROSTAT) 0.4 MG SL tablet Place 1 tablet (0.4 mg total) under the tongue every 5 (five) minutes x 3 doses as needed for chest pain. 08/23/12  Yes Rogelia Mire, NP  omega-3 acid ethyl esters (LOVAZA) 1 G capsule Take 1 g by mouth 2 (two) times daily.    Yes Historical Provider, MD  rosuvastatin (CRESTOR) 20 MG tablet Take 20 mg by mouth daily.     Yes Historical Provider, MD   BP 112/78  Pulse 58  Temp(Src) 98.2 F (36.8 C) (Oral)  Resp 18  Ht 5' 3.5" (1.613 m)  Wt 200 lb (90.719 kg)  BMI 34.87 kg/m2  SpO2 97% Physical Exam  Nursing note and vitals reviewed. Constitutional: He is oriented to person, place, and time. He appears well-developed and well-nourished.  HENT:  Head: Normocephalic and atraumatic.  Eyes: Conjunctivae are normal. Right eye exhibits no discharge. Left eye exhibits no discharge.  Neck: Normal range of motion. Neck supple. No tracheal deviation present.  Cardiovascular: Normal rate, regular rhythm and intact distal pulses.   Pulmonary/Chest: Effort normal and breath sounds normal.  Abdominal: Soft. He exhibits no distension. There is no tenderness. There is no guarding.  Musculoskeletal: He exhibits no edema and no tenderness.  Neurological: He is alert and oriented to person, place, and time.  Skin: Skin is warm. No rash noted.  Psychiatric: He has a normal mood and affect.    ED Course  Procedures (including critical care time) Labs Review Labs Reviewed  BASIC METABOLIC  PANEL - Abnormal; Notable for the following:    Glucose, Bld 115 (*)    All other components within normal limits  COMPREHENSIVE METABOLIC PANEL - Abnormal; Notable for the following:    Glucose, Bld 106 (*)    All other components within normal limits  CBC  TROPONIN I  TROPONIN I  CBC WITH DIFFERENTIAL  TSH  PLATELET INHIBITION P2Y12  PROTIME-INR  I-STAT TROPOININ, ED  I-STAT TROPOININ, ED    Imaging Review No results found. Dg Chest 2 View  09/12/2013   CLINICAL DATA:  Hypertension.  EXAM: CHEST  2 VIEW  COMPARISON:  DG CHEST 2 VIEW dated 08/28/2012  FINDINGS: Cardiomediastinal silhouette is unremarkable. The lungs are  clear without pleural effusions or focal consolidations. Persistent mildly elevated left hemidiaphragm with scarring. Coronary artery stents versus pulmonary surgical material. Trachea projects midline and there is no pneumothorax. Soft tissue planes and included osseous structures are non-suspicious.  IMPRESSION: No acute cardiopulmonary process.   Electronically Signed   By: Elon Alas   On: 09/12/2013 04:43    EKG Interpretation   Date/Time:  Tuesday September 12 2013 00:38:29 EDT Ventricular Rate:  69 PR Interval:  138 QRS Duration: 90 QT Interval:  368 QTC Calculation: 394 R Axis:   44 Text Interpretation:  Normal sinus rhythm with sinus arrhythmia Normal ECG  Confirmed by Kaydence Baba  MD, Denora Wysocki ([id number]) on 09/12/2013 3:32:57 AM      MDM   Final diagnoses:  Back pain  CAD (coronary artery disease)  HTN (hypertension)   Patient with significant cardiac history presents with similar symptoms to previous nSTEMI. Patient's symptoms improved with time in ED and vitals normalized. Patient had aspirin prior to arrival. Plan for second troponin and possible observation ER. EKG no acute findings. Reviewed.  On recheck patient still does not feel baseline.  He has vague right back pain with no chest pain or shortness of breath. Discussed with hospitalist for  observation telemetry and possible stress test in the a.m.  The patients results and plan were reviewed and discussed.   Any x-rays performed were personally reviewed by myself.   Differential diagnosis were considered with the presenting HPI.   Filed Vitals:   09/12/13 1607 09/12/13 1615 09/12/13 1630 09/12/13 2027  BP: 140/79 129/78 123/80 155/89  Pulse: 57 59 69 60  Temp:    97.5 F (36.4 C)  TempSrc:    Oral  Resp: 20 20 20 18   Height:      Weight:      SpO2: 99% 100% 100% 100%    Admission/ observation were discussed with the admitting physician, patient and/or family and they are comfortable with the plan.      Mariea Clonts, MD 09/14/13 1050

## 2013-09-12 NOTE — Progress Notes (Signed)
Pt arrived to 2w31.  Pt A&Ox4, with no c/o of CP, pain, SOB.  Pt oriented to room.  Pt on monitor 2w31, CCMD notified.  Call bell within reach and wife at bedside.  MD to see patient at bedside.  Will continue to monitor pt closely.

## 2013-09-12 NOTE — ED Notes (Signed)
MD at bedside. 

## 2013-09-12 NOTE — H&P (View-Only) (Signed)
Cardiology Consultation  [name] [name]    [id number] 14-Apr-1961  Reason for Consult: CAD; BP elevation  Requesting Physician: Hal Hope  Primary Cardiologist: Aundra Dubin  HPI: This is a 53 y.o. male with a past medical history significant for colon CA, status post resection and chemotherapy 17 years ago with recurrent lung lesion one year later, known CAD , who underwent initial intervention 8 years ago with stenting of his LAD and second diagonal vessel.  In April2014, he presented with a  non-ST segment elevation myocardial infarction symptoms of upper back tightness and pressure was found to have a patent stent in his mid LAD immediately after the second diagonal,, the first diagonal vessel, which was very small in caliber, with 80% stenosis at its origin, and a large second diagonal vessel, with 90% in-stent restenosis in the proximal vessel.  There was 20% plaque in the RCA in the mid-distal region, but the posterolateral branch had 95% focal stenosis proximally.  He underwent two-vessel intervention at that time by Dr. Martinique angiosculpt cutting balloon, and PTCA/insertion of a 2.25x12 mm Promus Premier DES stent in the PLA vessel.  He states his back discomfort significantly improved.  Over the past several weeks, he has had difficulty with blood pressure lability.  He also has started to notice upper back tightness.  In his words, this was similar to the back discomfort that he fell one year ago.  He was admitted and evening by the hospitalist service. Initial ECG did not demonstrate significant change.  Initial troponin is normal.    Past Medical History  Diagnosis Date  . BARRETT'S ESOPHAGUS, HX OF   . HYPERTENSION   . DYSLIPIDEMIA   . CAD     a. 2007: DES to LAD and Diag;  b. 08/2011 non-ischemic myoview, EF 60%;  c. 08/2012 NSTEMI/Cath/PCI: LM n, LAD patent stent, D1 small, 80, D2 90 ISR (PTCA), LCX nl, RCA 20d, RPL 95 (2.25x12 Promus Premier DES), EF 55-65%.  Marland Kitchen SLEEP APNEA   .  Secondary malignant neoplasm of lung     a. s/p wedge resection  . ADENOCARCINOMA, SIGMOID COLON     a. s/p resection/chemo  . HIATAL HERNIA WITH REFLUX   . GERD (gastroesophageal reflux disease)   . Diverticulosis   . Hyperplastic colon polyp   . Allergy     SEASONAL  . Anginal pain    Past Surgical History  Procedure Laterality Date  . Coronary stent placement  2007    taxus  . Colon resection  1998  . Tonsillectomy    . Lung removal, partial      LEFT  . Knee surgery    . Colon surgery      FAMHx: Family History  Problem Relation Age of Onset  . Heart disease Mother     alive @ 69.  s/p cabg  . Colon polyps Mother   . Heart disease Father     alive @ 23.  s/p stenting  . Colon cancer Neg Hx   . Heart attack Paternal Uncle     @ 64  . Heart attack Paternal Uncle     @ 60    SOCHx:  reports that he has never smoked. He has never used smokeless tobacco. He reports that he does not drink alcohol or use illicit drugs.  ALLERGIES: No Known Allergies  ROS General: Negative; No fevers, chills, or night sweats HEENT: Negative; No changes in vision or hearing, sinus congestion, difficulty swallowing Pulmonary: Negative; No cough, wheezing,  shortness of breath, hemoptysis Cardiovascular: Positive for upper back discomfort, which he states is the similar sensation that he felt one year ago, when he presented with a non-ST segment elevation MI.; No  presyncope, syncope, palpatations GI: Remote history of colon CA; No nausea, vomiting, diarrhea, or abdominal pain GU: Negative; No dysuria, hematuria, or difficulty voiding Musculoskeletal: Negative; no myalgias, joint pain, or weakness Hematologic: Negative; no easy bruising, bleeding Endocrine: Negative; no heat/cold intolerance; no diabetes Neuro: Negative; no changes in balance, headaches Skin: Negative; No rashes or skin lesions Psychiatric: positive for anxiety; No behavioral problems, depression Sleep: positive for  sleep apnea; No daytime sleepiness, hypersomnolence, bruxism, restless legs, hypnogognic hallucinations, no cataplexy  HOME MEDICATIONS: Prescriptions prior to admission  Medication Sig Dispense Refill  . amLODipine (NORVASC) 5 MG tablet Take 5 mg by mouth 2 (two) times daily.       Marland Kitchen aspirin 325 MG tablet Take 325 mg by mouth 2 (two) times daily.      . clopidogrel (PLAVIX) 75 MG tablet Take 1 tablet (75 mg total) by mouth daily.  90 tablet  3  . DEXILANT 60 MG capsule Take 60 mg by mouth daily.       . enalapril (VASOTEC) 5 MG tablet Take 1 tablet (5 mg total) by mouth 2 (two) times daily.  180 tablet  3  . ezetimibe (ZETIA) 10 MG tablet Take 10 mg by mouth daily.      . metoprolol tartrate (LOPRESSOR) 25 MG tablet Take 0.5 tablets (12.5 mg total) by mouth 2 (two) times daily.  30 tablet  6  . nitroGLYCERIN (NITROSTAT) 0.4 MG SL tablet Place 1 tablet (0.4 mg total) under the tongue every 5 (five) minutes x 3 doses as needed for chest pain.  25 tablet  3  . omega-3 acid ethyl esters (LOVAZA) 1 G capsule Take 1 g by mouth 2 (two) times daily.       . rosuvastatin (CRESTOR) 20 MG tablet Take 20 mg by mouth daily.          HOSPITAL MEDICATIONS: . amLODipine  5 mg Oral BID  . aspirin  325 mg Oral BID  . atorvastatin  40 mg Oral q1800  . clopidogrel  75 mg Oral Q breakfast  . enalapril  10 mg Oral BID  . enoxaparin (LOVENOX) injection  40 mg Subcutaneous Q24H  . ezetimibe  10 mg Oral Daily  . metoprolol tartrate  12.5 mg Oral BID  . omega-3 acid ethyl esters  1 g Oral BID  . pantoprazole  40 mg Oral Daily    VITALS: Blood pressure 136/79, pulse 63, temperature 97.8 F (36.6 C), temperature source Oral, resp. rate 17, height 5' 11"  (1.803 m), weight 197 lb 1.5 oz (89.4 kg), SpO2 100.00%.  PHYSICAL EXAM: General appearance: alert, cooperative and no distress Neck: no adenopathy, no carotid bruit, no JVD, supple, symmetrical, trachea midline and thyroid not enlarged, symmetric, no  tenderness/mass/nodules Lungs: clear to auscultation bilaterally Heart: regular rate and rhythm and faint 1/6 sem; no s3 or s4 Abdomen: soft, non-tender; bowel sounds normal; no masses,  no organomegaly Extremities: no edema, redness or tenderness in the calves or thighs Pulses: 2+ and symmetric Skin: Skin color, texture, turgor normal. No rashes or lesions Neurologic: Grossly normal  LABS: Results for orders placed during the hospital encounter of 09/12/13 (from the past 48 hour(s))  CBC     Status: None   Collection Time    09/12/13 12:48 AM  Result Value Ref Range   WBC 9.7  4.0 - 10.5 K/uL   RBC 5.33  4.22 - 5.81 MIL/uL   Hemoglobin 16.6  13.0 - 17.0 g/dL   HCT 46.8  39.0 - 52.0 %   MCV 87.8  78.0 - 100.0 fL   MCH 31.1  26.0 - 34.0 pg   MCHC 35.5  30.0 - 36.0 g/dL   RDW 13.1  11.5 - 15.5 %   Platelets 262  150 - 400 K/uL  BASIC METABOLIC PANEL     Status: Abnormal   Collection Time    09/12/13 12:48 AM      Result Value Ref Range   Sodium 143  137 - 147 mEq/L   Potassium 4.3  3.7 - 5.3 mEq/L   Chloride 104  96 - 112 mEq/L   CO2 26  19 - 32 mEq/L   Glucose, Bld 115 (*) 70 - 99 mg/dL   BUN 14  6 - 23 mg/dL   Creatinine, Ser 0.75  0.50 - 1.35 mg/dL   Calcium 10.0  8.4 - 10.5 mg/dL   GFR calc non Af Amer >90  >90 mL/min   GFR calc Af Amer >90  >90 mL/min   Comment: (NOTE)     The eGFR has been calculated using the CKD EPI equation.     This calculation has not been validated in all clinical situations.     eGFR's persistently <90 mL/min signify possible Chronic Kidney     Disease.  Randolm Idol, ED     Status: None   Collection Time    09/12/13 12:53 AM      Result Value Ref Range   Troponin i, poc 0.01  0.00 - 0.08 ng/mL   Comment 3            Comment: Due to the release kinetics of cTnI,     a negative result within the first hours     of the onset of symptoms does not rule out     myocardial infarction with certainty.     If myocardial infarction is  still suspected,     repeat the test at appropriate intervals.  Randolm Idol, ED     Status: None   Collection Time    09/12/13  4:43 AM      Result Value Ref Range   Troponin i, poc 0.00  0.00 - 0.08 ng/mL   Comment 3            Comment: Due to the release kinetics of cTnI,     a negative result within the first hours     of the onset of symptoms does not rule out     myocardial infarction with certainty.     If myocardial infarction is still suspected,     repeat the test at appropriate intervals.  TROPONIN I     Status: None   Collection Time    09/12/13  7:56 AM      Result Value Ref Range   Troponin I <0.30  <0.30 ng/mL   Comment:            Due to the release kinetics of cTnI,     a negative result within the first hours     of the onset of symptoms does not rule out     myocardial infarction with certainty.     If myocardial infarction is still suspected,     repeat the test at appropriate intervals.  COMPREHENSIVE METABOLIC PANEL     Status: Abnormal   Collection Time    09/12/13  7:56 AM      Result Value Ref Range   Sodium 141  137 - 147 mEq/L   Potassium 4.3  3.7 - 5.3 mEq/L   Chloride 103  96 - 112 mEq/L   CO2 26  19 - 32 mEq/L   Glucose, Bld 106 (*) 70 - 99 mg/dL   BUN 14  6 - 23 mg/dL   Creatinine, Ser 0.72  0.50 - 1.35 mg/dL   Calcium 9.4  8.4 - 10.5 mg/dL   Total Protein 7.0  6.0 - 8.3 g/dL   Albumin 4.0  3.5 - 5.2 g/dL   AST 17  0 - 37 U/L   ALT 20  0 - 53 U/L   Alkaline Phosphatase 72  39 - 117 U/L   Total Bilirubin 0.4  0.3 - 1.2 mg/dL   GFR calc non Af Amer >90  >90 mL/min   GFR calc Af Amer >90  >90 mL/min   Comment: (NOTE)     The eGFR has been calculated using the CKD EPI equation.     This calculation has not been validated in all clinical situations.     eGFR's persistently <90 mL/min signify possible Chronic Kidney     Disease.  CBC WITH DIFFERENTIAL     Status: None   Collection Time    09/12/13  7:56 AM      Result Value Ref Range     WBC 6.7  4.0 - 10.5 K/uL   RBC 5.03  4.22 - 5.81 MIL/uL   Hemoglobin 14.9  13.0 - 17.0 g/dL   HCT 45.0  39.0 - 52.0 %   MCV 89.5  78.0 - 100.0 fL   MCH 29.6  26.0 - 34.0 pg   MCHC 33.1  30.0 - 36.0 g/dL   RDW 13.3  11.5 - 15.5 %   Platelets 235  150 - 400 K/uL   Neutrophils Relative % 69  43 - 77 %   Neutro Abs 4.7  1.7 - 7.7 K/uL   Lymphocytes Relative 22  12 - 46 %   Lymphs Abs 1.4  0.7 - 4.0 K/uL   Monocytes Relative 8  3 - 12 %   Monocytes Absolute 0.5  0.1 - 1.0 K/uL   Eosinophils Relative 1  0 - 5 %   Eosinophils Absolute 0.1  0.0 - 0.7 K/uL   Basophils Relative 0  0 - 1 %   Basophils Absolute 0.0  0.0 - 0.1 K/uL  TSH     Status: None   Collection Time    09/12/13  7:56 AM      Result Value Ref Range   TSH 1.780  0.350 - 4.500 uIU/mL   Comment: Please note change in reference range.    IMAGING: Dg Chest 2 View  09/12/2013   CLINICAL DATA:  Hypertension.  EXAM: CHEST  2 VIEW  COMPARISON:  DG CHEST 2 VIEW dated 08/28/2012  FINDINGS: Cardiomediastinal silhouette is unremarkable. The lungs are clear without pleural effusions or focal consolidations. Persistent mildly elevated left hemidiaphragm with scarring. Coronary artery stents versus pulmonary surgical material. Trachea projects midline and there is no pneumothorax. Soft tissue planes and included osseous structures are non-suspicious.  IMPRESSION: No acute cardiopulmonary process.   Electronically Signed   By: Elon Alas   On: 09/12/2013 04:43    IMPRESSION: Principal Problem:   CAD (coronary artery  disease) Active Problems:   ADENOCARCINOMA, SIGMOID COLON   SLEEP APNEA   HTN (hypertension)   Back pain   HLD (hyperlipidemia)   RECOMMENDATION:  [name] [name] is a 53 year old gentleman, who has a history of hypertension, hyperlipidemia, and known coronary artery disease, dating back to 2008 when he underwent initial stenting to his mid LAD and second diagonal vessel.  One year ago, he presented with  upper back discomfort and ruled in for non-ST segment elevation myocardial infarction.  He was found to have high-grade restenosis of his diagonal stent and a new stenosis in the posterolateral branch of his RCA.  He underwent two-vessel intervention.  For the past 2 weeks.  He has had blood pressure lability.  He also has had some increased anxiety relative to this increased blood pressure.  He has noticed upper back pressure which he feels is similar to the back discomfort that he felt one year ago.  His initial ECG and cardiac markers do not show acute ST change or troponin elevation.  After discussing with him different options concerning a noninvasive strategy versus potential definitive repeat cardiac catheterization.  The patient prefers to undergo definitive repeat cardiac catheterization.  He has anything to eat or drink since 8 AM. today.  We will try to arrange this for this afternoon.  I discussed the risks, benefits, of the procedure.  In the past, there was difficulty with right radial artery access, which led to a femoral approach. Because of this we will do this via the femoral approach.  Troy Sine, MD, [facility] 09/12/2013 1:03 PM

## 2013-09-12 NOTE — Consult Note (Signed)
Cardiology Consultation  SHIRL LUDINGTON    [id number] 1961/01/12  Reason for Consult: CAD; BP elevation  Requesting Physician: Hal Hope  Primary Cardiologist: Aundra Dubin  HPI: This is a 53 y.o. male with a past medical history significant for colon CA, status post resection and chemotherapy 17 years ago with recurrent lung lesion one year later, known CAD , who underwent initial intervention 8 years ago with stenting of his LAD and second diagonal vessel.  In April2014, he presented with a  non-ST segment elevation myocardial infarction symptoms of upper back tightness and pressure was found to have a patent stent in his mid LAD immediately after the second diagonal,, the first diagonal vessel, which was very small in caliber, with 80% stenosis at its origin, and a large second diagonal vessel, with 90% in-stent restenosis in the proximal vessel.  There was 20% plaque in the RCA in the mid-distal region, but the posterolateral branch had 95% focal stenosis proximally.  He underwent two-vessel intervention at that time by Dr. Martinique angiosculpt cutting balloon, and PTCA/insertion of a 2.25x12 mm Promus Premier DES stent in the PLA vessel.  He states his back discomfort significantly improved.  Over the past several weeks, he has had difficulty with blood pressure lability.  He also has started to notice upper back tightness.  In his words, this was similar to the back discomfort that he fell one year ago.  He was admitted and evening by the hospitalist service. Initial ECG did not demonstrate significant change.  Initial troponin is normal.    Past Medical History  Diagnosis Date  . BARRETT'S ESOPHAGUS, HX OF   . HYPERTENSION   . DYSLIPIDEMIA   . CAD     a. 2007: DES to LAD and Diag;  b. 08/2011 non-ischemic myoview, EF 60%;  c. 08/2012 NSTEMI/Cath/PCI: LM n, LAD patent stent, D1 small, 80, D2 90 ISR (PTCA), LCX nl, RCA 20d, RPL 95 (2.25x12 Promus Premier DES), EF 55-65%.  Marland Kitchen SLEEP APNEA   .  Secondary malignant neoplasm of lung     a. s/p wedge resection  . ADENOCARCINOMA, SIGMOID COLON     a. s/p resection/chemo  . HIATAL HERNIA WITH REFLUX   . GERD (gastroesophageal reflux disease)   . Diverticulosis   . Hyperplastic colon polyp   . Allergy     SEASONAL  . Anginal pain    Past Surgical History  Procedure Laterality Date  . Coronary stent placement  2007    taxus  . Colon resection  1998  . Tonsillectomy    . Lung removal, partial      LEFT  . Knee surgery    . Colon surgery      FAMHx: Family History  Problem Relation Age of Onset  . Heart disease Mother     alive @ 40.  s/p cabg  . Colon polyps Mother   . Heart disease Father     alive @ 47.  s/p stenting  . Colon cancer Neg Hx   . Heart attack Paternal Uncle     @ 47  . Heart attack Paternal Uncle     @ 27    SOCHx:  reports that he has never smoked. He has never used smokeless tobacco. He reports that he does not drink alcohol or use illicit drugs.  ALLERGIES: No Known Allergies  ROS General: Negative; No fevers, chills, or night sweats HEENT: Negative; No changes in vision or hearing, sinus congestion, difficulty swallowing Pulmonary: Negative; No cough, wheezing,  shortness of breath, hemoptysis Cardiovascular: Positive for upper back discomfort, which he states is the similar sensation that he felt one year ago, when he presented with a non-ST segment elevation MI.; No  presyncope, syncope, palpatations GI: Remote history of colon CA; No nausea, vomiting, diarrhea, or abdominal pain GU: Negative; No dysuria, hematuria, or difficulty voiding Musculoskeletal: Negative; no myalgias, joint pain, or weakness Hematologic: Negative; no easy bruising, bleeding Endocrine: Negative; no heat/cold intolerance; no diabetes Neuro: Negative; no changes in balance, headaches Skin: Negative; No rashes or skin lesions Psychiatric: positive for anxiety; No behavioral problems, depression Sleep: positive for  sleep apnea; No daytime sleepiness, hypersomnolence, bruxism, restless legs, hypnogognic hallucinations, no cataplexy  HOME MEDICATIONS: Prescriptions prior to admission  Medication Sig Dispense Refill  . amLODipine (NORVASC) 5 MG tablet Take 5 mg by mouth 2 (two) times daily.       Marland Kitchen aspirin 325 MG tablet Take 325 mg by mouth 2 (two) times daily.      . clopidogrel (PLAVIX) 75 MG tablet Take 1 tablet (75 mg total) by mouth daily.  90 tablet  3  . DEXILANT 60 MG capsule Take 60 mg by mouth daily.       . enalapril (VASOTEC) 5 MG tablet Take 1 tablet (5 mg total) by mouth 2 (two) times daily.  180 tablet  3  . ezetimibe (ZETIA) 10 MG tablet Take 10 mg by mouth daily.      . metoprolol tartrate (LOPRESSOR) 25 MG tablet Take 0.5 tablets (12.5 mg total) by mouth 2 (two) times daily.  30 tablet  6  . nitroGLYCERIN (NITROSTAT) 0.4 MG SL tablet Place 1 tablet (0.4 mg total) under the tongue every 5 (five) minutes x 3 doses as needed for chest pain.  25 tablet  3  . omega-3 acid ethyl esters (LOVAZA) 1 G capsule Take 1 g by mouth 2 (two) times daily.       . rosuvastatin (CRESTOR) 20 MG tablet Take 20 mg by mouth daily.          HOSPITAL MEDICATIONS: . amLODipine  5 mg Oral BID  . aspirin  325 mg Oral BID  . atorvastatin  40 mg Oral q1800  . clopidogrel  75 mg Oral Q breakfast  . enalapril  10 mg Oral BID  . enoxaparin (LOVENOX) injection  40 mg Subcutaneous Q24H  . ezetimibe  10 mg Oral Daily  . metoprolol tartrate  12.5 mg Oral BID  . omega-3 acid ethyl esters  1 g Oral BID  . pantoprazole  40 mg Oral Daily    VITALS: Blood pressure 136/79, pulse 63, temperature 97.8 F (36.6 C), temperature source Oral, resp. rate 17, height 5' 11"  (1.803 m), weight 197 lb 1.5 oz (89.4 kg), SpO2 100.00%.  PHYSICAL EXAM: General appearance: alert, cooperative and no distress Neck: no adenopathy, no carotid bruit, no JVD, supple, symmetrical, trachea midline and thyroid not enlarged, symmetric, no  tenderness/mass/nodules Lungs: clear to auscultation bilaterally Heart: regular rate and rhythm and faint 1/6 sem; no s3 or s4 Abdomen: soft, non-tender; bowel sounds normal; no masses,  no organomegaly Extremities: no edema, redness or tenderness in the calves or thighs Pulses: 2+ and symmetric Skin: Skin color, texture, turgor normal. No rashes or lesions Neurologic: Grossly normal  LABS: Results for orders placed during the hospital encounter of 09/12/13 (from the past 48 hour(s))  CBC     Status: None   Collection Time    09/12/13 12:48 AM  Result Value Ref Range   WBC 9.7  4.0 - 10.5 K/uL   RBC 5.33  4.22 - 5.81 MIL/uL   Hemoglobin 16.6  13.0 - 17.0 g/dL   HCT 46.8  39.0 - 52.0 %   MCV 87.8  78.0 - 100.0 fL   MCH 31.1  26.0 - 34.0 pg   MCHC 35.5  30.0 - 36.0 g/dL   RDW 13.1  11.5 - 15.5 %   Platelets 262  150 - 400 K/uL  BASIC METABOLIC PANEL     Status: Abnormal   Collection Time    09/12/13 12:48 AM      Result Value Ref Range   Sodium 143  137 - 147 mEq/L   Potassium 4.3  3.7 - 5.3 mEq/L   Chloride 104  96 - 112 mEq/L   CO2 26  19 - 32 mEq/L   Glucose, Bld 115 (*) 70 - 99 mg/dL   BUN 14  6 - 23 mg/dL   Creatinine, Ser 0.75  0.50 - 1.35 mg/dL   Calcium 10.0  8.4 - 10.5 mg/dL   GFR calc non Af Amer >90  >90 mL/min   GFR calc Af Amer >90  >90 mL/min   Comment: (NOTE)     The eGFR has been calculated using the CKD EPI equation.     This calculation has not been validated in all clinical situations.     eGFR's persistently <90 mL/min signify possible Chronic Kidney     Disease.  Randolm Idol, ED     Status: None   Collection Time    09/12/13 12:53 AM      Result Value Ref Range   Troponin i, poc 0.01  0.00 - 0.08 ng/mL   Comment 3            Comment: Due to the release kinetics of cTnI,     a negative result within the first hours     of the onset of symptoms does not rule out     myocardial infarction with certainty.     If myocardial infarction is  still suspected,     repeat the test at appropriate intervals.  Randolm Idol, ED     Status: None   Collection Time    09/12/13  4:43 AM      Result Value Ref Range   Troponin i, poc 0.00  0.00 - 0.08 ng/mL   Comment 3            Comment: Due to the release kinetics of cTnI,     a negative result within the first hours     of the onset of symptoms does not rule out     myocardial infarction with certainty.     If myocardial infarction is still suspected,     repeat the test at appropriate intervals.  TROPONIN I     Status: None   Collection Time    09/12/13  7:56 AM      Result Value Ref Range   Troponin I <0.30  <0.30 ng/mL   Comment:            Due to the release kinetics of cTnI,     a negative result within the first hours     of the onset of symptoms does not rule out     myocardial infarction with certainty.     If myocardial infarction is still suspected,     repeat the test at appropriate intervals.  COMPREHENSIVE METABOLIC PANEL     Status: Abnormal   Collection Time    09/12/13  7:56 AM      Result Value Ref Range   Sodium 141  137 - 147 mEq/L   Potassium 4.3  3.7 - 5.3 mEq/L   Chloride 103  96 - 112 mEq/L   CO2 26  19 - 32 mEq/L   Glucose, Bld 106 (*) 70 - 99 mg/dL   BUN 14  6 - 23 mg/dL   Creatinine, Ser 0.72  0.50 - 1.35 mg/dL   Calcium 9.4  8.4 - 10.5 mg/dL   Total Protein 7.0  6.0 - 8.3 g/dL   Albumin 4.0  3.5 - 5.2 g/dL   AST 17  0 - 37 U/L   ALT 20  0 - 53 U/L   Alkaline Phosphatase 72  39 - 117 U/L   Total Bilirubin 0.4  0.3 - 1.2 mg/dL   GFR calc non Af Amer >90  >90 mL/min   GFR calc Af Amer >90  >90 mL/min   Comment: (NOTE)     The eGFR has been calculated using the CKD EPI equation.     This calculation has not been validated in all clinical situations.     eGFR's persistently <90 mL/min signify possible Chronic Kidney     Disease.  CBC WITH DIFFERENTIAL     Status: None   Collection Time    09/12/13  7:56 AM      Result Value Ref Range     WBC 6.7  4.0 - 10.5 K/uL   RBC 5.03  4.22 - 5.81 MIL/uL   Hemoglobin 14.9  13.0 - 17.0 g/dL   HCT 45.0  39.0 - 52.0 %   MCV 89.5  78.0 - 100.0 fL   MCH 29.6  26.0 - 34.0 pg   MCHC 33.1  30.0 - 36.0 g/dL   RDW 13.3  11.5 - 15.5 %   Platelets 235  150 - 400 K/uL   Neutrophils Relative % 69  43 - 77 %   Neutro Abs 4.7  1.7 - 7.7 K/uL   Lymphocytes Relative 22  12 - 46 %   Lymphs Abs 1.4  0.7 - 4.0 K/uL   Monocytes Relative 8  3 - 12 %   Monocytes Absolute 0.5  0.1 - 1.0 K/uL   Eosinophils Relative 1  0 - 5 %   Eosinophils Absolute 0.1  0.0 - 0.7 K/uL   Basophils Relative 0  0 - 1 %   Basophils Absolute 0.0  0.0 - 0.1 K/uL  TSH     Status: None   Collection Time    09/12/13  7:56 AM      Result Value Ref Range   TSH 1.780  0.350 - 4.500 uIU/mL   Comment: Please note change in reference range.    IMAGING: Dg Chest 2 View  09/12/2013   CLINICAL DATA:  Hypertension.  EXAM: CHEST  2 VIEW  COMPARISON:  DG CHEST 2 VIEW dated 08/28/2012  FINDINGS: Cardiomediastinal silhouette is unremarkable. The lungs are clear without pleural effusions or focal consolidations. Persistent mildly elevated left hemidiaphragm with scarring. Coronary artery stents versus pulmonary surgical material. Trachea projects midline and there is no pneumothorax. Soft tissue planes and included osseous structures are non-suspicious.  IMPRESSION: No acute cardiopulmonary process.   Electronically Signed   By: Elon Alas   On: 09/12/2013 04:43    IMPRESSION: Principal Problem:   CAD (coronary artery  disease) Active Problems:   ADENOCARCINOMA, SIGMOID COLON   SLEEP APNEA   HTN (hypertension)   Back pain   HLD (hyperlipidemia)   RECOMMENDATION:  Mr. Kolbey Teichert is a 53 year old gentleman, who has a history of hypertension, hyperlipidemia, and known coronary artery disease, dating back to 2008 when he underwent initial stenting to his mid LAD and second diagonal vessel.  One year ago, he presented with  upper back discomfort and ruled in for non-ST segment elevation myocardial infarction.  He was found to have high-grade restenosis of his diagonal stent and a new stenosis in the posterolateral branch of his RCA.  He underwent two-vessel intervention.  For the past 2 weeks.  He has had blood pressure lability.  He also has had some increased anxiety relative to this increased blood pressure.  He has noticed upper back pressure which he feels is similar to the back discomfort that he felt one year ago.  His initial ECG and cardiac markers do not show acute ST change or troponin elevation.  After discussing with him different options concerning a noninvasive strategy versus potential definitive repeat cardiac catheterization.  The patient prefers to undergo definitive repeat cardiac catheterization.  He has anything to eat or drink since 8 AM. today.  We will try to arrange this for this afternoon.  I discussed the risks, benefits, of the procedure.  In the past, there was difficulty with right radial artery access, which led to a femoral approach. Because of this we will do this via the femoral approach.  Troy Sine, MD, [facility] 09/12/2013 1:03 PM

## 2013-09-19 ENCOUNTER — Encounter: Payer: Self-pay | Admitting: Cardiovascular Disease

## 2013-09-20 NOTE — Discharge Summary (Signed)
Physician Discharge Summary  [name] [name] MRN:[id number] DOB: 01/05/1961 DOA: 09/12/2013  PCP: PRADHAN,PRADEEP K, MD  Admit date: 09/12/2013 Discharge date: 09/12/2013  Time spent: 35  minutes  Recommendations for Outpatient Follow-up:  Cardiology recommending medical therapy with continued antiplatelet therapy, optimal blood pressure control, and continued aggressive treatment of hyperlipidemia  Discharge Diagnoses:  Principal Problem:   CAD (coronary artery disease) Active Problems:   ADENOCARCINOMA, SIGMOID COLON   SLEEP APNEA   HTN (hypertension)   Back pain   HLD (hyperlipidemia)   Discharge Condition: Stable  Diet recommendation: Heart Healthy  Filed Weights   09/12/13 0029 09/12/13 0507 09/12/13 1300  Weight: 90.719 kg (200 lb) 89.4 kg (197 lb 1.5 oz) 87.181 kg (192 lb 3.2 oz)    History of present illness:  [name] [name] is a 52 y.o. male with history of non-ST elevation MI last year with stent placement, hypertension, hyperlipidemia sleep apnea presents to the ER because of concerns of uncontrolled blood pressure and having increasing upper back pain for last few days. Patient upper back pain lasts a few minutes each time and has no relation to exertion and denies any diaphoresis palpitations shortness of breath. Patient states he had similar pain last year when he had non-ST elevation MI. Patient presently chest pain-free. Denies any nausea vomiting abdominal pain or diarrhea. Cardiac markers and EKG chest x-ray were unremarkable. Patient states recently his blood pressure has been running high in the 160s.    Hospital Course:  Patient is a pleasant 52-year-old gentleman with an established history of coronary artery disease status post percutaneous intervention admitted to the medicine service on 09/12/2013 presenting with complaints of back tightness and pressure. It appears he had a similar prep presentation back in April of 2014 at which time he was found to  have an ST segment elevation myocardial infarction and found to have 90% in-stent restenosis in the proximal vessel. EKG did not reveal ischemic changes, troponins were cycled and remained negative. Cardiology was consulted as patient was evaluated by Dr. Kelly. He was taken to the cath lab on 09/12/2013 where cardiac catheterization did not reveal significant residual coronary obstructive disease, having widely patent mid LAD stent as well as widely patent stent in the PLA branch of the right coronary artery. Patient tolerated procedure well. It is possible that symptoms may have been secondary to anxiety. He was discharged in stable condition on 09/12/2048  Procedures: Cardiac catheterization performed on 09/12/2013 Impression:  Normal LV function with an ejection fraction of 55%  No significant residual coronary obstructive disease with evidence for a widely patent mid LAD stent after the takeoff of the second diagonal vessel with 20-30% mild narrowing in the proximal segment of the in the diagonal vessel; normal left circumflex coronary artery; mild 20-30% mid RCA stenosis with 20% ostial PDA stenosis and a widely patent stent in the PLA branch of the right coronary artery.   Consultations:  Cardiology  Discharge Exam: Filed Vitals:   09/12/13 1630  BP: 123/80  Pulse: 69  Temp:   Resp: 20    General: Patient is in no acute distress, states feeling better, denies chest pain or shortness of breath Cardiovascular: Regular rate rhythm normal S1-S2 Respiratory: Clear to auscultation bilaterally Abdomen: Soft nontender nondistended  Discharge Instructions You were cared for by a hospitalist during your hospital stay. If you have any questions about your discharge medications or the care you received while you were in the hospital after you are discharged, you   can call the unit and asked to speak with the hospitalist on call if the hospitalist that took care of you is not available. Once you  are discharged, your primary care physician will handle any further medical issues. Please note that NO REFILLS for any discharge medications will be authorized once you are discharged, as it is imperative that you return to your primary care physician (or establish a relationship with a primary care physician if you do not have one) for your aftercare needs so that they can reassess your need for medications and monitor your lab values.      Discharge Orders   Future Orders Complete By Expires   Call MD for:  difficulty breathing, headache or visual disturbances  As directed    Call MD for:  extreme fatigue  As directed    Call MD for:  persistant nausea and vomiting  As directed    Call MD for:  redness, tenderness, or signs of infection (pain, swelling, redness, odor or green/yellow discharge around incision site)  As directed    Call MD for:  severe uncontrolled pain  As directed    Call MD for:  temperature >100.4  As directed    Diet - low sodium heart healthy  As directed    Increase activity slowly  As directed        Medication List    STOP taking these medications       aspirin 325 MG tablet  Replaced by:  aspirin 81 MG EC tablet      TAKE these medications       ALPRAZolam 0.25 MG tablet  Commonly known as:  XANAX  Take 1 tablet (0.25 mg total) by mouth 2 (two) times daily as needed for anxiety.     amLODipine 5 MG tablet  Commonly known as:  NORVASC  Take 5 mg by mouth 2 (two) times daily.     aspirin 81 MG EC tablet  Take 1 tablet (81 mg total) by mouth daily.  Start taking on:  09/13/2013     clopidogrel 75 MG tablet  Commonly known as:  PLAVIX  Take 1 tablet (75 mg total) by mouth daily.     DEXILANT 60 MG capsule  Generic drug:  dexlansoprazole  Take 60 mg by mouth daily.     enalapril 5 MG tablet  Commonly known as:  VASOTEC  Take 1 tablet (5 mg total) by mouth 2 (two) times daily.     ezetimibe 10 MG tablet  Commonly known as:  ZETIA  Take 10 mg  by mouth daily.     metoprolol tartrate 25 MG tablet  Commonly known as:  LOPRESSOR  Take 0.5 tablets (12.5 mg total) by mouth 2 (two) times daily.     nitroGLYCERIN 0.4 MG SL tablet  Commonly known as:  NITROSTAT  Place 1 tablet (0.4 mg total) under the tongue every 5 (five) minutes x 3 doses as needed for chest pain.     omega-3 acid ethyl esters 1 G capsule  Commonly known as:  LOVAZA  Take 1 g by mouth 2 (two) times daily.     rosuvastatin 20 MG tablet  Commonly known as:  CRESTOR  Take 20 mg by mouth daily.       No Known Allergies Follow-up Information   Follow up with PRADHAN,PRADEEP K, MD In 1 week.   Specialty:  Internal Medicine   Contact information:   [address] [place] [address]   [phone]        The results of significant diagnostics from this hospitalization (including imaging, microbiology, ancillary and laboratory) are listed below for reference.    Significant Diagnostic Studies: Dg Chest 2 View  09/12/2013   CLINICAL DATA:  Hypertension.  EXAM: CHEST  2 VIEW  COMPARISON:  DG CHEST 2 VIEW dated 08/28/2012  FINDINGS: Cardiomediastinal silhouette is unremarkable. The lungs are clear without pleural effusions or focal consolidations. Persistent mildly elevated left hemidiaphragm with scarring. Coronary artery stents versus pulmonary surgical material. Trachea projects midline and there is no pneumothorax. Soft tissue planes and included osseous structures are non-suspicious.  IMPRESSION: No acute cardiopulmonary process.   Electronically Signed   By: Courtnay  Bloomer   On: 09/12/2013 04:43    Microbiology: No results found for this or any previous visit (from the past 240 hour(s)).   Labs: Basic Metabolic Panel:  Recent Labs Lab 09/12/13 0048 09/12/13 0756  NA 143 141  K 4.3 4.3  CL 104 103  CO2 26 26  GLUCOSE 115* 106*  BUN 14 14  CREATININE 0.75 0.72  CALCIUM 10.0 9.4   Liver Function Tests:  Recent Labs Lab 09/12/13 0756   AST 17  ALT 20  ALKPHOS 72  BILITOT 0.4  PROT 7.0  ALBUMIN 4.0   No results found for this basename: LIPASE, AMYLASE,  in the last 168 hours No results found for this basename: AMMONIA,  in the last 168 hours CBC:  Recent Labs Lab 09/12/13 0048 09/12/13 0756  WBC 9.7 6.7  NEUTROABS  --  4.7  HGB 16.6 14.9  HCT 46.8 45.0  MCV 87.8 89.5  PLT 262 235   Cardiac Enzymes:  Recent Labs Lab 09/12/13 0756 09/12/13 1620  TROPONINI <0.30 <0.30   BNP: BNP (last 3 results) No results found for this basename: PROBNP,  in the last 8760 hours CBG: No results found for this basename: GLUCAP,  in the last 168 hours     Signed:  Roseana Rhine  [facility] 09/12/2013, 6:03 PM    

## 2014-01-13 ENCOUNTER — Encounter: Payer: Self-pay | Admitting: Internal Medicine

## 2014-01-17 ENCOUNTER — Encounter: Payer: Self-pay | Admitting: Cardiology

## 2014-01-17 ENCOUNTER — Ambulatory Visit (INDEPENDENT_AMBULATORY_CARE_PROVIDER_SITE_OTHER): Payer: BC Managed Care – PPO | Admitting: Cardiology

## 2014-01-17 VITALS — BP 128/82 | HR 72 | Ht 71.0 in | Wt 206.0 lb

## 2014-01-17 DIAGNOSIS — I2510 Atherosclerotic heart disease of native coronary artery without angina pectoris: Secondary | ICD-10-CM

## 2014-01-17 DIAGNOSIS — I10 Essential (primary) hypertension: Secondary | ICD-10-CM

## 2014-01-17 LAB — BASIC METABOLIC PANEL
BUN: 19 mg/dL (ref 6–23)
CO2: 30 meq/L (ref 19–32)
CREATININE: 1 mg/dL (ref 0.4–1.5)
Calcium: 9.4 mg/dL (ref 8.4–10.5)
Chloride: 104 mEq/L (ref 96–112)
GFR: 85.96 mL/min (ref >60.00)
GLUCOSE: 81 mg/dL (ref 70–99)
Potassium: 4.2 mEq/L (ref 3.5–5.1)
Sodium: 138 mEq/L (ref 135–145)

## 2014-01-17 LAB — LIPID PANEL
Cholesterol: 130 mg/dL (ref 0–200)
HDL: 29.5 mg/dL — ABNORMAL LOW (ref >39.00)
LDL Cholesterol: 71 mg/dL (ref 0–99)
NonHDL: 100.5
TRIGLYCERIDES: 149 mg/dL (ref 0.0–149.0)
Total CHOL/HDL Ratio: 4
VLDL: 29.8 mg/dL (ref 0.0–40.0)

## 2014-01-17 LAB — CBC WITH DIFFERENTIAL/PLATELET
BASOS PCT: 0.2 % (ref 0.0–3.0)
Basophils Absolute: 0 10*3/uL (ref 0.0–0.1)
Eosinophils Absolute: 0.2 10*3/uL (ref 0.0–0.7)
Eosinophils Relative: 2.5 % (ref 0.0–5.0)
HCT: 43.1 % (ref 39.0–52.0)
Hemoglobin: 14.4 g/dL (ref 13.0–17.0)
LYMPHS ABS: 1.7 10*3/uL (ref 0.7–4.0)
Lymphocytes Relative: 21.1 % (ref 12.0–46.0)
MCHC: 33.3 g/dL (ref 30.0–36.0)
MCV: 88.3 fl (ref 78.0–100.0)
MONO ABS: 0.7 10*3/uL (ref 0.1–1.0)
Monocytes Relative: 8.5 % (ref 3.0–12.0)
Neutro Abs: 5.6 10*3/uL (ref 1.4–7.7)
Neutrophils Relative %: 67.7 % (ref 43.0–77.0)
Platelets: 233 10*3/uL (ref 150.0–400.0)
RBC: 4.88 Mil/uL (ref 4.22–5.81)
RDW: 13.4 % (ref 11.5–15.5)
WBC: 8.2 10*3/uL (ref 4.0–10.5)

## 2014-01-17 NOTE — Patient Instructions (Signed)
Your physician recommends that you have  lab work today--BMET/Lipid profile/CBCd  Your physician wants you to follow-up in: 1 year with Dr Aundra Dubin. (September 2016).  You will receive a reminder letter in the mail two months in advance. If you don't receive a letter, please call our office to schedule the follow-up appointment.

## 2014-01-18 NOTE — Progress Notes (Signed)
Patient ID: [name] [name], male   DOB: August 04, 1960, 53 y.o.   MRN: [id number] PCP: Dr. Shon Millet [facility])  53 yo with history of CAD, HTN, and hyperlipidemia presents for cardiology followup.  Patient lives in [place] but works full time for [employer] in the [place] area.  He had a normal ETT-Cardiolite in 4/13, but in 4/14, he presented to [facility] with NSTEMI.  LHC showed 90% in-stent restenosis in D2 and 95% PLOM stenosis.  He had PTCA to D2 and Promus DES to [facility].  EF was 55% on LV-gram.  He developed chest pain again in 4/15 and there was concern for unstable angina.  He had LHC showing EF 55% and patent stents, only luminal irregularities.   Since that time, he has been doing well.  No exertional chest pain or dyspnea.  He is quite active at work.  He can walk up 3 flights of steps with only mild fatigue.    Labs (4/12): K 4.3, creatinine 0.7, LDL 82, HDL 41 Labs (8/14): LDL 95, HDL 38.4 Labs (4/15): K 4.3, creatinine 0.72  PMH: 1. HTN 2. Hyperlipidemia 3. OSA: Uses CPAP 4. CAD: Unstable angina in 2007, DES to LAD and DES to diagonal (V stent technique so long-term Plavix).  Myoview (4/10): ? In [place], was normal per Dr. Nichola Sizer notes.  ETT-Cardiolite (4/13) with 9' exercise, no evidence for ischemia or infarction, EF 60%.  NSTEMI 4/14 with LHC showing 90% in-stent restenosis in D2 and 95% PLOM stenosis.  He had PTCA to D2 and Promus DES to [facility].  EF was 55% on LV-gram. LHC (4/15) with EF 55%, patent stents, and luminal irregularities.  5. Colon cancer: Treated with surgery and chemotherapy.  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  6. GERD  SH: Married with children.  Lives in [place], works for [employer] in [place].  Nonsmoker.    FH: Uncle with MI at 44, another uncle with MI at 73. Father with CABG.   Current Outpatient Prescriptions  Medication Sig Dispense Refill  . ALPRAZolam (XANAX) 0.25 MG tablet Take 1 tablet (0.25 mg  total) by mouth 2 (two) times daily as needed for anxiety.  15 tablet  0  . amLODipine (NORVASC) 5 MG tablet Take 5 mg by mouth 2 (two) times daily.       Marland Kitchen aspirin EC 81 MG EC tablet Take 1 tablet (81 mg total) by mouth daily.  30 tablet  1  . clopidogrel (PLAVIX) 75 MG tablet Take 1 tablet (75 mg total) by mouth daily.  90 tablet  3  . DEXILANT 60 MG capsule Take 60 mg by mouth daily.       . enalapril (VASOTEC) 5 MG tablet Take 1 tablet (5 mg total) by mouth 2 (two) times daily.  180 tablet  3  . ezetimibe (ZETIA) 10 MG tablet Take 10 mg by mouth daily.      . metoprolol succinate (TOPROL-XL) 25 MG 24 hr tablet Take 25 mg by mouth daily.      . nitroGLYCERIN (NITROSTAT) 0.4 MG SL tablet Place 1 tablet (0.4 mg total) under the tongue every 5 (five) minutes x 3 doses as needed for chest pain.  25 tablet  3  . omega-3 acid ethyl esters (LOVAZA) 1 G capsule Take 1 g by mouth 2 (two) times daily.       . rosuvastatin (CRESTOR) 20 MG tablet Take 20 mg by mouth daily.        Marland Kitchen  TOPROL XL 25 MG 24 hr tablet        No current facility-administered medications for this visit.    BP 128/82  Pulse 72  Ht 5\' 11"  (1.803 m)  Wt 206 lb (93.441 kg)  BMI 28.74 kg/m2  SpO2 97% General: NAD Neck: No JVD, no thyromegaly or thyroid nodule.  Lungs: Clear to auscultation bilaterally with normal respiratory effort. CV: Nondisplaced PMI.  Heart regular S1/S2, no S3/S4, no murmur.  No peripheral edema.  No carotid bruit.  Normal pedal pulses.  Abdomen: Soft, nontender, no hepatosplenomegaly, no distention.  Neurologic: Alert and oriented x 3.  Psych: Normal affect. Extremities: No clubbing or cyanosis.   Assessment/Plan: 1. CAD: History of CAD with multiple stents.  Most recent cath in 4/15 showed patent stents, probably noncardiac chest pain.  He will continue on ASA 81, metoprolol, enalapril, and Crestor.  He has had no problems with bleeding so will continue Plavix long-term.  Will get CBC today.  2.  Hyperlipidemia: Check lipids today.   3. HTN: BP tends to rise when he is anxious but for the most part is now well-controlled on current regimen. Will check BMET.   Loralie Champagne 01/18/2014

## 2014-04-26 ENCOUNTER — Encounter (HOSPITAL_COMMUNITY): Payer: Self-pay | Admitting: Cardiology

## 2014-07-17 ENCOUNTER — Encounter (HOSPITAL_COMMUNITY): Payer: Self-pay | Admitting: Emergency Medicine

## 2014-07-17 ENCOUNTER — Emergency Department (HOSPITAL_COMMUNITY): Payer: BLUE CROSS/BLUE SHIELD

## 2014-07-17 ENCOUNTER — Emergency Department (HOSPITAL_COMMUNITY)
Admission: EM | Admit: 2014-07-17 | Discharge: 2014-07-17 | Disposition: A | Discharge status: Home / Self Care | Payer: BLUE CROSS/BLUE SHIELD | Attending: Emergency Medicine | Admitting: Emergency Medicine

## 2014-07-17 DIAGNOSIS — Z85038 Personal history of other malignant neoplasm of large intestine: Secondary | ICD-10-CM | POA: Insufficient documentation

## 2014-07-17 DIAGNOSIS — R079 Chest pain, unspecified: Secondary | ICD-10-CM | POA: Diagnosis present

## 2014-07-17 DIAGNOSIS — Z8669 Personal history of other diseases of the nervous system and sense organs: Secondary | ICD-10-CM | POA: Insufficient documentation

## 2014-07-17 DIAGNOSIS — Z8719 Personal history of other diseases of the digestive system: Secondary | ICD-10-CM | POA: Insufficient documentation

## 2014-07-17 DIAGNOSIS — Z7982 Long term (current) use of aspirin: Secondary | ICD-10-CM | POA: Insufficient documentation

## 2014-07-17 DIAGNOSIS — Z79899 Other long term (current) drug therapy: Secondary | ICD-10-CM | POA: Insufficient documentation

## 2014-07-17 DIAGNOSIS — I10 Essential (primary) hypertension: Secondary | ICD-10-CM | POA: Diagnosis not present

## 2014-07-17 DIAGNOSIS — Z85118 Personal history of other malignant neoplasm of bronchus and lung: Secondary | ICD-10-CM | POA: Insufficient documentation

## 2014-07-17 DIAGNOSIS — Z7902 Long term (current) use of antithrombotics/antiplatelets: Secondary | ICD-10-CM | POA: Insufficient documentation

## 2014-07-17 DIAGNOSIS — E785 Hyperlipidemia, unspecified: Secondary | ICD-10-CM | POA: Diagnosis not present

## 2014-07-17 DIAGNOSIS — I25119 Atherosclerotic heart disease of native coronary artery with unspecified angina pectoris: Secondary | ICD-10-CM | POA: Insufficient documentation

## 2014-07-17 DIAGNOSIS — R0789 Other chest pain: Secondary | ICD-10-CM | POA: Diagnosis not present

## 2014-07-17 LAB — CBC
HCT: 44.8 % (ref 39.0–52.0)
Hemoglobin: 15 g/dL (ref 13.0–17.0)
MCH: 29.4 pg (ref 26.0–34.0)
MCHC: 33.5 g/dL (ref 30.0–36.0)
MCV: 87.8 fL (ref 78.0–100.0)
PLATELETS: 239 10*3/uL (ref 150–400)
RBC: 5.1 MIL/uL (ref 4.22–5.81)
RDW: 12.8 % (ref 11.5–15.5)
WBC: 9.5 10*3/uL (ref 4.0–10.5)

## 2014-07-17 LAB — BASIC METABOLIC PANEL
Anion gap: 6 (ref 5–15)
BUN: 17 mg/dL (ref 6–23)
CALCIUM: 9.2 mg/dL (ref 8.4–10.5)
CO2: 26 mmol/L (ref 19–32)
Chloride: 105 mmol/L (ref 96–112)
Creatinine, Ser: 0.88 mg/dL (ref 0.50–1.35)
GFR calc Af Amer: >90 mL/min (ref >90)
GFR calc non Af Amer: >90 mL/min (ref >90)
GLUCOSE: 129 mg/dL — AB (ref 70–99)
POTASSIUM: 3.9 mmol/L (ref 3.5–5.1)
Sodium: 137 mmol/L (ref 135–145)

## 2014-07-17 LAB — I-STAT TROPONIN, ED: TROPONIN I, POC: 0 ng/mL (ref 0.00–0.08)

## 2014-07-17 LAB — TROPONIN I: Troponin I: <0.03 ng/mL (ref <0.031)

## 2014-07-17 NOTE — ED Notes (Signed)
Gave pt a [place] sandwich and drink.

## 2014-07-17 NOTE — Discharge Instructions (Signed)
It was our pleasure to provide your ER care today - we hope that you feel better.  Follow up with your cardiologist in the next couple days - call office tomorrow morning to arrange that follow up.  Return to ER right away if worse, recurrent or persistent chest pain, trouble breathing, fevers, weak/fainting, other concern.        Chest Pain (Nonspecific) It is often hard to give a specific diagnosis for the cause of chest pain. There is always a chance that your pain could be related to something serious, such as a heart attack or a blood clot in the lungs. You need to follow up with your health care provider for further evaluation. CAUSES   Heartburn.  Pneumonia or bronchitis.  Anxiety or stress.  Inflammation around your heart (pericarditis) or lung (pleuritis or pleurisy).  A blood clot in the lung.  A collapsed lung (pneumothorax). It can develop suddenly on its own (spontaneous pneumothorax) or from trauma to the chest.  Shingles infection (herpes zoster virus). The chest wall is composed of bones, muscles, and cartilage. Any of these can be the source of the pain.  The bones can be bruised by injury.  The muscles or cartilage can be strained by coughing or overwork.  The cartilage can be affected by inflammation and become sore (costochondritis). DIAGNOSIS  Lab tests or other studies may be needed to find the cause of your pain. Your health care provider may have you take a test called an ambulatory electrocardiogram (ECG). An ECG records your heartbeat patterns over a 24-hour period. You may also have other tests, such as:  Transthoracic echocardiogram (TTE). During echocardiography, sound waves are used to evaluate how blood flows through your heart.  Transesophageal echocardiogram (TEE).  Cardiac monitoring. This allows your health care provider to monitor your heart rate and rhythm in real time.  Holter monitor. This is a portable device that records your  heartbeat and can help diagnose heart arrhythmias. It allows your health care provider to track your heart activity for several days, if needed.  Stress tests by exercise or by giving medicine that makes the heart beat faster. TREATMENT   Treatment depends on what may be causing your chest pain. Treatment may include:  Acid blockers for heartburn.  Anti-inflammatory medicine.  Pain medicine for inflammatory conditions.  Antibiotics if an infection is present.  You may be advised to change lifestyle habits. This includes stopping smoking and avoiding alcohol, caffeine, and chocolate.  You may be advised to keep your head raised (elevated) when sleeping. This reduces the chance of acid going backward from your stomach into your esophagus. Most of the time, nonspecific chest pain will improve within 2-3 days with rest and mild pain medicine.  HOME CARE INSTRUCTIONS   If antibiotics were prescribed, take them as directed. Finish them even if you start to feel better.  For the next few days, avoid physical activities that bring on chest pain. Continue physical activities as directed.  Do not use any tobacco products, including cigarettes, chewing tobacco, or electronic cigarettes.  Avoid drinking alcohol.  Only take medicine as directed by your health care provider.  Follow your health care provider's suggestions for further testing if your chest pain does not go away.  Keep any follow-up appointments you made. If you do not go to an appointment, you could develop lasting (chronic) problems with pain. If there is any problem keeping an appointment, call to reschedule. SEEK MEDICAL CARE IF:  Your chest pain does not go away, even after treatment.  You have a rash with blisters on your chest.  You have a fever. SEEK IMMEDIATE MEDICAL CARE IF:   You have increased chest pain or pain that spreads to your arm, neck, jaw, back, or abdomen.  You have shortness of breath.  You have  an increasing cough, or you cough up blood.  You have severe back or abdominal pain.  You feel nauseous or vomit.  You have severe weakness.  You faint.  You have chills. This is an emergency. Do not wait to see if the pain will go away. Get medical help at once. Call your local emergency services (911 in [place] Do not drive yourself to the hospital. MAKE SURE YOU:   Understand these instructions.  Will watch your condition.  Will get help right away if you are not doing well or get worse. Document Released: 02/11/2005 Document Revised: 05/09/2013 Document Reviewed: 12/08/2007 [facility] Patient Information 2015 [place], [place]. This information is not intended to replace advice given to you by your health care provider. Make sure you discuss any questions you have with your health care provider.

## 2014-07-17 NOTE — ED Notes (Addendum)
Onset today developed chest tightness and middle back pain. Currently pain 1-2/10 pressure denies shortness of breath.  States feels the same way he did when he had an MI in the past.

## 2014-07-17 NOTE — ED Provider Notes (Signed)
CSN: [id number]     Arrival date & time 07/17/14  1346 History   First MD Initiated Contact with Patient 07/17/14 1548     Chief Complaint  Patient presents with  . Chest Pain     (Consider location/radiation/quality/duration/timing/severity/associated sxs/prior Treatment) Patient is a 54 y.o. male presenting with chest pain. The history is provided by the patient.  Chest Pain Associated symptoms: no abdominal pain, no back pain, no cough, no fever, no headache, no palpitations, no shortness of breath and not vomiting   pt with hx cad, stents, c/o very mild discomfort mid chest, sl tightness, and felt lightheaded/faint for a few seconds. No palpitations or sense of rapid or irregular heartbeat. No syncope. States no other recent cp or discomfort even w exertion. States he felt he may have pulled a muscle in back a couple weeks ago - no current back pain. States symptoms were not as intense or severe as w prior cad/mi.  States compliant w normal meds. Pain not pleuritic. Lasted 3-0 minutes-resolved. No leg pain or swelling. Pt denies associated nv, diaphoresis or sob. No recent unusual doe or fatigue. No cough or uri c/o. No fever or chills. No heartburn.      Past Medical History  Diagnosis Date  . BARRETT'S ESOPHAGUS, HX OF   . HYPERTENSION   . DYSLIPIDEMIA   . CAD     a. 2007: DES to LAD and Diag;  b. 08/2011 non-ischemic myoview, EF 60%;  c. 08/2012 NSTEMI/Cath/PCI: LM n, LAD patent stent, D1 small, 80, D2 90 ISR (PTCA), LCX nl, RCA 20d, RPL 95 (2.25x12 Promus Premier DES), EF 55-65%.  Marland Kitchen SLEEP APNEA   . Secondary malignant neoplasm of lung     a. s/p wedge resection  . ADENOCARCINOMA, SIGMOID COLON     a. s/p resection/chemo  . HIATAL HERNIA WITH REFLUX   . GERD (gastroesophageal reflux disease)   . Diverticulosis   . Hyperplastic colon polyp   . Allergy     SEASONAL  . Anginal pain    Past Surgical History  Procedure Laterality Date  . Coronary stent placement  2007   taxus  . Colon resection  1998  . Tonsillectomy    . Lung removal, partial      LEFT  . Knee surgery    . Colon surgery    . Left heart catheterization with coronary angiogram N/A 08/22/2012    Procedure: LEFT HEART CATHETERIZATION WITH CORONARY ANGIOGRAM;  Surgeon: Peter M Martinique, MD;  Location[facility] LAB;  Service: Cardiovascular;  Laterality: N/A;  . Left heart catheterization with coronary angiogram N/A 09/12/2013    Procedure: LEFT HEART CATHETERIZATION WITH CORONARY ANGIOGRAM;  Surgeon: Troy Sine, MD;  Location: [facility] CATH LAB;  Service: Cardiovascular;  Laterality: N/A;   Family History  Problem Relation Age of Onset  . Heart disease Mother     alive @ 67.  s/p cabg  . Colon polyps Mother   . Heart disease Father     alive @ 84.  s/p stenting  . Colon cancer Neg Hx   . Heart attack Paternal Uncle     @ 30  . Heart attack Paternal Uncle     @ 4   History  Substance Use Topics  . Smoking status: Never Smoker   . Smokeless tobacco: Never Used  . Alcohol Use: No     Comment: occas has 4 oz of red wine.    Review of Systems  Constitutional: Negative for  fever and chills.  HENT: Negative for sore throat.   Eyes: Negative for redness.  Respiratory: Negative for cough and shortness of breath.   Cardiovascular: Positive for chest pain. Negative for palpitations and leg swelling.  Gastrointestinal: Negative for vomiting, abdominal pain and diarrhea.  Genitourinary: Negative for flank pain.  Musculoskeletal: Negative for back pain and neck pain.  Skin: Negative for rash.  Neurological: Negative for headaches.  Hematological: Does not bruise/bleed easily.  Psychiatric/Behavioral: Negative for confusion.      Allergies  Review of patient's allergies indicates no known allergies.  Home Medications   Prior to Admission medications   Medication Sig Start Date End Date Taking? Authorizing Provider  ALPRAZolam (XANAX) 0.25 MG tablet Take 1 tablet (0.25 mg total) by  mouth 2 (two) times daily as needed for anxiety. 09/12/13   Kelvin Cellar, MD  amLODipine (NORVASC) 5 MG tablet Take 5 mg by mouth 2 (two) times daily.     Historical Provider, MD  aspirin EC 81 MG EC tablet Take 1 tablet (81 mg total) by mouth daily. 09/13/13   Kelvin Cellar, MD  clopidogrel (PLAVIX) 75 MG tablet Take 1 tablet (75 mg total) by mouth daily. 08/23/12   Rogelia Mire, NP  DEXILANT 60 MG capsule Take 60 mg by mouth daily.  07/19/11   Historical Provider, MD  enalapril (VASOTEC) 5 MG tablet Take 1 tablet (5 mg total) by mouth 2 (two) times daily. 01/05/13   Larey Dresser, MD  ezetimibe (ZETIA) 10 MG tablet Take 10 mg by mouth daily.    Historical Provider, MD  metoprolol succinate (TOPROL-XL) 25 MG 24 hr tablet Take 25 mg by mouth daily.    Historical Provider, MD  nitroGLYCERIN (NITROSTAT) 0.4 MG SL tablet Place 1 tablet (0.4 mg total) under the tongue every 5 (five) minutes x 3 doses as needed for chest pain. 08/23/12   Rogelia Mire, NP  omega-3 acid ethyl esters (LOVAZA) 1 G capsule Take 1 g by mouth 2 (two) times daily.     Historical Provider, MD  rosuvastatin (CRESTOR) 20 MG tablet Take 20 mg by mouth daily.      Historical Provider, MD  TOPROL XL 25 MG 24 hr tablet  12/09/13   Historical Provider, MD   BP 106/84 mmHg  Pulse 93  Temp(Src) 97.9 F (36.6 C) (Oral)  Resp 16  Ht 5\' 11"  (1.803 m)  Wt 205 lb (92.987 kg)  BMI 28.60 kg/m2  SpO2 100% Physical Exam  Constitutional: He is oriented to person, place, and time. He appears well-developed and well-nourished. No distress.  HENT:  Head: Atraumatic.  Eyes: Conjunctivae are normal. No scleral icterus.  Neck: Neck supple. No tracheal deviation present.  Cardiovascular: Normal rate, regular rhythm, normal heart sounds and intact distal pulses.  Exam reveals no gallop and no friction rub.   No murmur heard. Pulmonary/Chest: Effort normal and breath sounds normal. No accessory muscle usage. No respiratory distress.  He exhibits no tenderness.  Abdominal: Soft. Bowel sounds are normal. He exhibits no distension and no mass. There is no tenderness. There is no rebound and no guarding.  Genitourinary:  No cva tenderness  Musculoskeletal: Normal range of motion. He exhibits no edema or tenderness.  Neurological: He is alert and oriented to person, place, and time.  Steady gait.   Skin: Skin is warm and dry. He is not diaphoretic.  Psychiatric: He has a normal mood and affect.  Nursing note and vitals reviewed.   ED  Course  Procedures (including critical care time) Labs Review   Results for orders placed or performed during the hospital encounter of 07/17/14  CBC  Result Value Ref Range   WBC 9.5 4.0 - 10.5 K/uL   RBC 5.10 4.22 - 5.81 MIL/uL   Hemoglobin 15.0 13.0 - 17.0 g/dL   HCT 44.8 39.0 - 52.0 %   MCV 87.8 78.0 - 100.0 fL   MCH 29.4 26.0 - 34.0 pg   MCHC 33.5 30.0 - 36.0 g/dL   RDW 12.8 11.5 - 15.5 %   Platelets 239 150 - 400 K/uL  Basic metabolic panel  Result Value Ref Range   Sodium 137 135 - 145 mmol/L   Potassium 3.9 3.5 - 5.1 mmol/L   Chloride 105 96 - 112 mmol/L   CO2 26 19 - 32 mmol/L   Glucose, Bld 129 (H) 70 - 99 mg/dL   BUN 17 6 - 23 mg/dL   Creatinine, Ser 0.88 0.50 - 1.35 mg/dL   Calcium 9.2 8.4 - 10.5 mg/dL   GFR calc non Af Amer >90 >90 mL/min   GFR calc Af Amer >90 >90 mL/min   Anion gap 6 5 - 15  Troponin I  Result Value Ref Range   Troponin I <0.03 <0.031 ng/mL  I-stat troponin, ED (not at [facility])  Result Value Ref Range   Troponin i, poc 0.00 0.00 - 0.08 ng/mL   Comment 3           Dg Chest 2 View  07/17/2014   CLINICAL DATA:  Chest pain  EXAM: CHEST  2 VIEW  COMPARISON:  September 12, 2013  FINDINGS: There is stable scarring in the left lower lobe. There is no edema or consolidation. Heart size and pulmonary vascularity are normal. No adenopathy. There is a stent in the left anterior descending coronary artery. No bone lesions.  IMPRESSION: Scarring left lower lobe.   No edema or consolidation.   Electronically Signed   By: Lowella Grip III M.D.   On: 07/17/2014 15:30        EKG Interpretation   Date/Time:  Tuesday July 17 2014 13:51:47 EST Ventricular Rate:  73 PR Interval:  142 QRS Duration: 90 QT Interval:  386 QTC Calculation: 425 R Axis:   18 Text Interpretation:  Normal sinus rhythm with sinus arrhythmia No  significant change since last tracing Confirmed by Ashok Cordia  MD, Lennette Bihari  ([id number]) on 07/17/2014 3:45:42 PM      MDM   Iv ns. Labs. Continuous pulse ox and monitor. Labs.  Reviewed nursing notes and prior charts for additional history.   Recheck pt, comfortable. No cp.    Reviewed prior charts - prior admit 2015 for similar atypical cp, cath then w patent stents, and felt non cardiac cp, possible anxiety.  Recheck pt remains cp free. No recent unusual doe or fatigue, no recent exertional cp or discomfort of any sort.  Initial trop and repeat trop approx 6 hrs after symptoms, both negative/0.  Pt remains symptom free and currently appears stable for d/c.  Return precautions provided.  rec close card f/u.     Mirna Mires, MD 07/17/14 [phone]

## 2014-09-07 ENCOUNTER — Encounter: Payer: Self-pay | Admitting: Internal Medicine

## 2014-11-02 ENCOUNTER — Telehealth: Payer: Self-pay

## 2014-11-02 ENCOUNTER — Ambulatory Visit (INDEPENDENT_AMBULATORY_CARE_PROVIDER_SITE_OTHER): Payer: BLUE CROSS/BLUE SHIELD | Admitting: Internal Medicine

## 2014-11-02 ENCOUNTER — Encounter: Payer: Self-pay | Admitting: Internal Medicine

## 2014-11-02 VITALS — BP 132/82 | HR 64 | Ht 70.25 in | Wt 222.4 lb

## 2014-11-02 DIAGNOSIS — Z85038 Personal history of other malignant neoplasm of large intestine: Secondary | ICD-10-CM | POA: Diagnosis not present

## 2014-11-02 DIAGNOSIS — K2270 Barrett's esophagus without dysplasia: Secondary | ICD-10-CM | POA: Diagnosis not present

## 2014-11-02 MED ORDER — LINACLOTIDE 145 MCG PO CAPS: 145.0000 ug | ORAL_CAPSULE | Freq: Every day | ORAL | Status: DC

## 2014-11-02 NOTE — Telephone Encounter (Signed)
  11/02/2014    RE: [name] [name] DOB: [birth year] MRN: [id number]   To Whom it May Concern,    We have scheduled the above patient for an endoscopic procedure. Our records show that he is on anticoagulation therapy.   Please advise as to how long the patient may come off his therapy of Plavix prior to the procedure, which is scheduled for 12/28/2014.  Please fax back/ or route the completed form to Christian Mate at [phone].   Sincerely,    Christian Mate

## 2014-11-02 NOTE — Progress Notes (Signed)
[name] [name] 1961/03/28 [id number]  Note: This dictation was prepared with Dragon digital system. Any transcriptional errors that result from this procedure are unintentional.   History of Present Illness: This is a 54 year old white white male here to discuss recall upper endoscopy and colonoscopy. He has a history of Barrett's esophagus on endoscopy in 2001 and again in 2011. On most recent endoscopy in September 2013 there was no evidence of intestinal metaplasia. He is on Dexilant  60 mg daily. He takes it every day. If he skips a day he gets severe heartburn. He denies dysphagia. Patient has a history of coronary artery disease and Taxus stent placement by Dr.McLean, will be due for yearly visit in August 2016. He is on long-term Plavix because one of the stents was partially occluded. Patient has a history of adenocarcinoma of the left colon in 1998 ,sigmoid resection, 3 out of 15 nodes were positive. 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 Last colonoscopy in March 2011 showed hyperplastic polyps. He has chronic constipation for which she takes MiraLAX but would like to switch to something else.     Past Medical History  Diagnosis Date  . BARRETT'S ESOPHAGUS, HX OF   . HYPERTENSION   . DYSLIPIDEMIA   . CAD     a. 2007: DES to LAD and Diag;  b. 08/2011 non-ischemic myoview, EF 60%;  c. 08/2012 NSTEMI/Cath/PCI: LM n, LAD patent stent, D1 small, 80, D2 90 ISR (PTCA), LCX nl, RCA 20d, RPL 95 (2.25x12 Promus Premier DES), EF 55-65%.  Marland Kitchen SLEEP APNEA   . Secondary malignant neoplasm of lung     a. s/p wedge resection  . ADENOCARCINOMA, SIGMOID COLON     a. s/p resection/chemo  . HIATAL HERNIA WITH REFLUX   . GERD (gastroesophageal reflux disease)   . Diverticulosis   . Hyperplastic colon polyp   . Allergy     SEASONAL  . Anginal pain     Past Surgical History  Procedure Laterality Date  . Coronary stent placement  2007    taxus  . Colon resection  1998  .  Tonsillectomy    . Lung removal, partial      LEFT  . Knee surgery    . Colon surgery    . Left heart catheterization with coronary angiogram N/A 08/22/2012    Procedure: LEFT HEART CATHETERIZATION WITH CORONARY ANGIOGRAM;  Surgeon: Peter M Martinique, MD;  Location: [facility] CATH LAB;  Service: Cardiovascular;  Laterality: N/A;  . Left heart catheterization with coronary angiogram N/A 09/12/2013    Procedure: LEFT HEART CATHETERIZATION WITH CORONARY ANGIOGRAM;  Surgeon: Troy Sine, MD;  Location: [facility] CATH LAB;  Service: Cardiovascular;  Laterality: N/A;    No Known Allergies  Family history and social history have been reviewed.  Review of Systems:  positive for constipation. Negative for rectal bleeding dysphagia weight loss   The remainder of the 10 point ROS is negative except as outlined in the H&P  Physical Exam: General Appearance Well developed, in no distress Eyes  Non icteric  HEENT  Non traumatic, normocephalic  Mouth No lesion, tongue papillated, no cheilosis Neck Supple without adenopathy, thyroid not enlarged, no carotid bruits, no JVD Lungs Clear to auscultation bilaterally COR Normal S1, normal S2, regular rhythm, no murmur, quiet precordium Abdomen  soft nontender. Normoactive bowel sounds  Rectal  noted done  Extremities  No pedal edema Skin No lesions Neurological Alert and oriented x 3 Psychological Normal mood  and affect  Assessment and Plan:    54 year old white male with history of Barrett's esophagus due for repeat upper endoscopy. This will be scheduled he will continue on  acid suppression therapy and antireflux measures.  History of [facility] adenocarcinoma of the sigmoid colon resected in 1998. Lung mets resected in 2000. Last colonoscopy in 2011. He is due for recall colonoscopy which will be scheduled  Chronic constipation. We will switch from MiraLAX to Linzess 145 g daily. 90 day supply  Coronary artery disease status post Taxus stent placement. On  long-term Plavix he at we will ask Dr. Claris Gladden to approve holding Plavix for 5 days prior to his endoscopic procedures  Delfin Edis 11/02/2014

## 2014-11-02 NOTE — Patient Instructions (Addendum)
You have been scheduled for an endoscopy and colonoscopy. Please follow the written instructions given to you at your visit today. Please pick up your prep supplies at the pharmacy within the next 1-3 days. If you use inhalers (even only as needed), please bring them with you on the day of your procedure. Your physician has requested that you go to www.startemmi.com and enter the access code given to you at your visit today. This web site gives a general overview about your procedure. However, you should still follow specific instructions given to you by our office regarding your preparation for the procedure. We have sent medications to your pharmacy for pick.  Dr Einar Crow, Dr Shon Millet

## 2014-11-04 NOTE — Telephone Encounter (Signed)
OK to hold Plavix for procedure. 

## 2014-11-05 NOTE — Telephone Encounter (Signed)
The patient has been notified of this information and all questions answered.

## 2014-12-28 ENCOUNTER — Encounter: Payer: BLUE CROSS/BLUE SHIELD | Admitting: Internal Medicine

## 2015-02-22 ENCOUNTER — Encounter: Payer: Self-pay | Admitting: Gastroenterology

## 2015-02-22 ENCOUNTER — Ambulatory Visit (AMBULATORY_SURGERY_CENTER): Payer: BLUE CROSS/BLUE SHIELD | Admitting: Gastroenterology

## 2015-02-22 VITALS — BP 99/55 | HR 56 | Temp 97.7°F | Resp 13 | Ht 70.0 in | Wt 222.0 lb

## 2015-02-22 DIAGNOSIS — Z85038 Personal history of other malignant neoplasm of large intestine: Secondary | ICD-10-CM | POA: Diagnosis not present

## 2015-02-22 DIAGNOSIS — K2270 Barrett's esophagus without dysplasia: Secondary | ICD-10-CM | POA: Diagnosis not present

## 2015-02-22 DIAGNOSIS — D127 Benign neoplasm of rectosigmoid junction: Secondary | ICD-10-CM

## 2015-02-22 DIAGNOSIS — Z1211 Encounter for screening for malignant neoplasm of colon: Secondary | ICD-10-CM | POA: Diagnosis not present

## 2015-02-22 MED ORDER — SODIUM CHLORIDE 0.9 % IV SOLN: 500.0000 mL | INTRAVENOUS | Status: DC

## 2015-02-22 NOTE — Patient Instructions (Signed)
YOU HAD AN ENDOSCOPIC PROCEDURE TODAY AT THE [facility]:   Refer to the procedure report that was given to you for any specific questions about what was found during the examination.  If the procedure report does not answer your questions, please call your gastroenterologist to clarify.  If you requested that your care partner not be given the details of your procedure findings, then the procedure report has been included in a sealed envelope for you to review at your convenience later.  YOU SHOULD EXPECT: Some feelings of bloating in the abdomen. Passage of more gas than usual.  Walking can help get rid of the air that was put into your GI tract during the procedure and reduce the bloating. If you had a lower endoscopy (such as a colonoscopy or flexible sigmoidoscopy) you may notice spotting of blood in your stool or on the toilet paper. If you underwent a bowel prep for your procedure, you may not have a normal bowel movement for a few days.  Please Note:  You might notice some irritation and congestion in your nose or some drainage.  This is from the oxygen used during your procedure.  There is no need for concern and it should clear up in a day or so.  SYMPTOMS TO REPORT IMMEDIATELY:   Following lower endoscopy (colonoscopy or flexible sigmoidoscopy):  Excessive amounts of blood in the stool  Significant tenderness or worsening of abdominal pains  Swelling of the abdomen that is new, acute  Fever of 100F or higher   Following upper endoscopy (EGD)  Vomiting of blood or coffee ground material  New chest pain or pain under the shoulder blades  Painful or persistently difficult swallowing  New shortness of breath  Fever of 100F or higher  Black, tarry-looking stools  For urgent or emergent issues, a gastroenterologist can be reached at any hour by calling [phone].   DIET: Your first meal following the procedure should be a small meal and then it is ok to progress to  your normal diet. Heavy or fried foods are harder to digest and may make you feel nauseous or bloated.  Likewise, meals heavy in dairy and vegetables can increase bloating.  Drink plenty of fluids but you should avoid alcoholic beverages for 24 hours.  ACTIVITY:  You should plan to take it easy for the rest of today and you should NOT DRIVE or use heavy machinery until tomorrow (because of the sedation medicines used during the test).    FOLLOW UP: Our staff will call the number listed on your records the next business day following your procedure to check on you and address any questions or concerns that you may have regarding the information given to you following your procedure. If we do not reach you, we will leave a message.  However, if you are feeling well and you are not experiencing any problems, there is no need to return our call.  We will assume that you have returned to your regular daily activities without incident.  If any biopsies were taken you will be contacted by phone or by letter within the next 1-3 weeks.  Please call us at [phone] if you have not heard about the biopsies in 3 weeks.    SIGNATURES/CONFIDENTIALITY: You and/or your care partner have signed paperwork which will be entered into your electronic medical record.  These signatures attest to the fact that that the information above on your After Visit Summary has been reviewed   and is understood.  Full responsibility of the confidentiality of this discharge information lies with you and/or your care-partner. 

## 2015-02-22 NOTE — Op Note (Signed)
[place]  [address] [place], [id number]   ENDOSCOPY PROCEDURE REPORT  PATIENT: [name] [name], [name] [name]  MR#: [id number] BIRTHDATE: 04-19-1961 , 46  yrs. old GENDER: male ENDOSCOPIST: Yetta Flock, MD REFERRED BY: PROCEDURE DATE:  02/22/2015 PROCEDURE:  EGD w/ biopsy ASA CLASS:     Class III INDICATIONS:  surveillance. MEDICATIONS: Propofol 350 mg IV TOPICAL ANESTHETIC:  DESCRIPTION OF PROCEDURE: After the risks benefits and alternatives of the procedure were thoroughly explained, informed consent was obtained.  The LB [id number] endoscope was introduced through the mouth and advanced to the second portion of the duodenum , Without limitations.  The instrument was slowly withdrawn as the mucosa was fully examined.  FINDINGS: The proximal and mid esophagus were normal.  The GEJ was irregular with an extended tongue of salmon colored mucosa < 1cm in length, without nodularity.  Biopsies obtained.  DH, GEJ, and SCJ was roughly 39cm from the incisors.  The stomach was remarkable for multiple diminutive gastric polyps in the gastric body, a few of which were removed with cold forceps as a representative sample. The remainder of the examined stomach was normal.  The duodenal bulb and 2nd portion of the duodenum was normal.  Retroflexed views revealed no abnormalities.     The scope was then withdrawn from the patient and the procedure completed.  COMPLICATIONS: There were no immediate complications.  ENDOSCOPIC IMPRESSION: Irregular SCJ as described above, biopsies taken Benign appearing, suspected fundic gland polyps of the stomach, a few were removed Normal remainder of examined stomach and duodenum     RECOMMENDATIONS: Await pathology results Resume diet Resume medications (including plavix)   eSigned:  Yetta Flock, MD 02/22/2015 8:33 AM    CC: the patient  PATIENT NAME:  [name] [name], [name] [name] MR#: [id number]

## 2015-02-22 NOTE — Op Note (Signed)
[place]  [address] [place], [id number]   COLONOSCOPY PROCEDURE REPORT  PATIENT: [name] [name], [name] [name]  MR#: [id number] BIRTHDATE: 21-Nov-1960 , 54  yrs. old GENDER: male ENDOSCOPIST: Yetta Flock, MD REFERRED BY: PROCEDURE DATE:  02/22/2015 PROCEDURE:   Colonoscopy, surveillance and Colonoscopy with biopsy First Screening Colonoscopy - Avg.  risk and is 50 yrs.  old or older - No.  Prior Negative Screening - Now for repeat screening. N/A  History of Adenoma - Now for follow-up colonoscopy & has been > or = to 3 yrs.  N/A  Polyps removed today? 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 MEDICATIONS: Propofol 400 mg IV  DESCRIPTION OF PROCEDURE:   After the risks benefits and alternatives of the procedure were thoroughly explained, informed consent was obtained.  The digital rectal exam revealed no abnormalities of the rectum.   The LB [id number]  endoscope was introduced through the anus and advanced to the cecum, which was identified by both the appendix and ileocecal valve. No adverse events experienced.   The quality of the prep was good.  The instrument was then slowly withdrawn as the colon was fully examined. Estimated blood loss is zero unless otherwise noted in this procedure report.  COLON FINDINGS: A 68mm sessile polyp was noted in the rectosigmoid colon and removed with cold forceps.  A surgical anastomosis was noted in the sigmoid colon and was widely patent and appeared normal.  The remainder of the examined colon was normal without polyps or mass lesions.  Retroflexed views revealed no abnormalities. The time to cecum = 2.3 Withdrawal time = 12.2   The scope was withdrawn and the procedure completed. COMPLICATIONS: There were no immediate complications.  ENDOSCOPIC IMPRESSION: 59mm rectosigmoid polyp removed with cold forceps Normal appearing surgical  anastomosis Normal colon otherwise    RECOMMENDATIONS: Await pathology results Resume diet Resume medications  eSigned:  Yetta Flock, MD 02/22/2015 8:27 AM   cc: the patient   PATIENT NAME:  [name], [name] MR#: [id number]

## 2015-02-22 NOTE — Progress Notes (Signed)
Patient has a nonproductive cough. Patient stating he had a cold from his granddaughter three weeks ago. No fever or production of sputum.

## 2015-02-22 NOTE — Progress Notes (Signed)
Called to room to assist during endoscopic procedure.  Patient ID and intended procedure confirmed with present staff. Received instructions for my participation in the procedure from the performing physician.  

## 2015-02-22 NOTE — Progress Notes (Signed)
To recovery, report to Smith, RN, VSS 

## 2015-02-25 ENCOUNTER — Telehealth: Payer: Self-pay | Admitting: *Deleted

## 2015-02-25 NOTE — Telephone Encounter (Signed)
  Follow up Call-  Call back number 02/22/2015  Post procedure Call Back phone  # [phone]     Patient questions:  Do you have a fever, pain , or abdominal swelling? No. Pain Score  0 *  Have you tolerated food without any problems? Yes.    Have you been able to return to your normal activities? Yes.    Do you have any questions about your discharge instructions: Diet   No. Medications  No. Follow up visit  No.  Do you have questions or concerns about your Care? No.  Actions: * If pain score is 4 or above: No action needed, pain <4.

## 2015-02-27 ENCOUNTER — Encounter: Payer: Self-pay | Admitting: Gastroenterology

## 2015-12-20 ENCOUNTER — Ambulatory Visit (INDEPENDENT_AMBULATORY_CARE_PROVIDER_SITE_OTHER): Payer: BLUE CROSS/BLUE SHIELD | Admitting: Cardiology

## 2015-12-20 ENCOUNTER — Encounter: Payer: Self-pay | Admitting: Cardiology

## 2015-12-20 VITALS — BP 126/70 | HR 54 | Ht 70.0 in | Wt 213.0 lb

## 2015-12-20 DIAGNOSIS — I10 Essential (primary) hypertension: Secondary | ICD-10-CM | POA: Diagnosis not present

## 2015-12-20 DIAGNOSIS — I2510 Atherosclerotic heart disease of native coronary artery without angina pectoris: Secondary | ICD-10-CM

## 2015-12-20 LAB — LIPID PANEL
CHOLESTEROL: 125 mg/dL (ref 125–200)
HDL: 32 mg/dL — AB (ref >=40)
LDL Cholesterol: 65 mg/dL (ref <130)
Total CHOL/HDL Ratio: 3.9 Ratio (ref <=5.0)
Triglycerides: 142 mg/dL (ref <150)
VLDL: 28 mg/dL (ref <30)

## 2015-12-20 LAB — CBC WITH DIFFERENTIAL/PLATELET
BASOS PCT: 0 %
Basophils Absolute: 0 cells/uL (ref 0–200)
Eosinophils Absolute: 405 cells/uL (ref 15–500)
Eosinophils Relative: 5 %
HEMATOCRIT: 42.9 % (ref 38.5–50.0)
Hemoglobin: 14.7 g/dL (ref 13.2–17.1)
LYMPHS PCT: 25 %
Lymphs Abs: 2025 cells/uL (ref 850–3900)
MCH: 29.6 pg (ref 27.0–33.0)
MCHC: 34.3 g/dL (ref 32.0–36.0)
MCV: 86.3 fL (ref 80.0–100.0)
MONO ABS: 810 {cells}/uL (ref 200–950)
MONOS PCT: 10 %
MPV: 10.2 fL (ref 7.5–12.5)
NEUTROS PCT: 60 %
Neutro Abs: 4860 cells/uL (ref 1500–7800)
PLATELETS: 255 10*3/uL (ref 140–400)
RBC: 4.97 MIL/uL (ref 4.20–5.80)
RDW: 13.6 % (ref 11.0–15.0)
WBC: 8.1 10*3/uL (ref 3.8–10.8)

## 2015-12-20 LAB — BASIC METABOLIC PANEL
BUN: 17 mg/dL (ref 7–25)
CO2: 25 mmol/L (ref 20–31)
Calcium: 9.1 mg/dL (ref 8.6–10.3)
Chloride: 105 mmol/L (ref 98–110)
Creat: 0.88 mg/dL (ref 0.70–1.33)
GLUCOSE: 97 mg/dL (ref 65–99)
Potassium: 4.1 mmol/L (ref 3.5–5.3)
Sodium: 141 mmol/L (ref 135–146)

## 2015-12-20 NOTE — Patient Instructions (Signed)
Medication Instructions:  Your physician recommends that you continue on your current medications as directed. Please refer to the Current Medication list given to you today.   Labwork: BMET/CBCd/Lipid profile  Testing/Procedures: none  Follow-Up: Your physician wants you to follow-up in: 1 year with Dr End. (August 2018). You will receive a reminder letter in the mail two months in advance. If you don't receive a letter, please call our office to schedule the follow-up appointment.        If you need a refill on your cardiac medications before your next appointment, please call your pharmacy.

## 2015-12-21 NOTE — Progress Notes (Signed)
Patient ID: [name] [name], male   DOB: November 19, 1960, 55 y.o.   MRN: [id number] PCP: Dr. Shon Millet [facility])  54 yo with history of CAD, HTN, and hyperlipidemia presents for cardiology followup.  Patient lives in [place] but works full time for [employer] in the [place] area.  He had a normal ETT-Cardiolite in 4/13, but in 4/14, he presented to [facility] with NSTEMI.  LHC showed 90% in-stent restenosis in D2 and 95% PLOM stenosis.  He had PTCA to D2 and Promus DES to [facility].  EF was 55% on LV-gram.  He developed chest pain again in 4/15 and there was concern for unstable angina.  He had LHC showing EF 55% and patent stents, only luminal irregularities.   Recently, he has been doing well.  No exertional chest pain or dyspnea.  He is quite active at work.  He can walk up 3 flights of steps with only mild fatigue.  He is now taking Crestor 10 mg daily along with Zetia.  He had myalgias with Crestor 20 mg daily.   Labs (4/12): K 4.3, creatinine 0.7, LDL 82, HDL 41 Labs (8/14): LDL 95, HDL 38.4 Labs (4/15): K 4.3, creatinine 0.72 Labs (3/16): K 3.9, creatinine 0.88  ECG: NSR at 54, LVH  PMH: 1. HTN 2. Hyperlipidemia 3. OSA: Uses CPAP 4. CAD: Unstable angina in 2007, DES to LAD and DES to diagonal (V stent technique so long-term Plavix).  Myoview (4/10): ? In [place], was normal per Dr. Nichola Sizer notes.  ETT-Cardiolite (4/13) with 9' exercise, no evidence for ischemia or infarction, EF 60%.  NSTEMI 4/14 with LHC showing 90% in-stent restenosis in D2 and 95% PLOM stenosis.  He had PTCA to D2 and Promus DES to [facility].  EF was 55% on LV-gram. LHC (4/15) with EF 55%, patent stents, and luminal irregularities.  5. Colon cancer: Treated with surgery and chemotherapy.  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  6. GERD  SH: Married with children.  Lives in [place], works for [employer] in [place].  Nonsmoker.    FH: Uncle with MI at 45, another uncle with MI at  68. Father with CABG.   Current Outpatient Prescriptions  Medication Sig Dispense Refill  . amLODipine (NORVASC) 5 MG tablet Take 5 mg by mouth 2 (two) times daily.     Marland Kitchen aspirin EC 81 MG EC tablet Take 1 tablet (81 mg total) by mouth daily. 30 tablet 1  . Cholecalciferol (VITAMIN D3) 2000 UNITS capsule Take by mouth.    . clopidogrel (PLAVIX) 75 MG tablet Take 1 tablet (75 mg total) by mouth daily. 90 tablet 3  . cyanocobalamin (,VITAMIN B-12,) 1000 MCG/ML injection Inject into the muscle.    Marland Kitchen DEXILANT 60 MG capsule Take 60 mg by mouth daily.     Marland Kitchen ezetimibe (ZETIA) 10 MG tablet Take 10 mg by mouth daily.    . Linaclotide (LINZESS) 145 MCG CAPS capsule Take 1 capsule (145 mcg total) by mouth daily. 90 capsule 3  . loratadine (CLARITIN) 10 MG tablet Take 10 mg by mouth.    . metoprolol succinate (TOPROL-XL) 25 MG 24 hr tablet Take 25 mg by mouth daily.    . mometasone (NASONEX) 50 MCG/ACT nasal spray Frequency:QD   Dosage:50   MCG  Instructions:  Note:Dose: 50MCG    . nitroGLYCERIN (NITROSTAT) 0.4 MG SL tablet Place 1 tablet (0.4 mg total) under the tongue every 5 (five) minutes x 3 doses as needed for chest pain. 25  tablet 3  . omega-3 acid ethyl esters (LOVAZA) 1 G capsule Take 1 g by mouth 2 (two) times daily.     . rosuvastatin (CRESTOR) 20 MG tablet Take 20 mg by mouth daily.       No current facility-administered medications for this visit.     BP 126/70   Pulse (!) 54   Ht 5\' 10"  (1.778 m)   Wt 213 lb (96.6 kg)   BMI 30.56 kg/m  General: NAD Neck: No JVD, no thyromegaly or thyroid nodule.  Lungs: Clear to auscultation bilaterally with normal respiratory effort. CV: Nondisplaced PMI.  Heart regular S1/S2, no S3/S4, no murmur.  No peripheral edema.  No carotid bruit.  Normal pedal pulses.  Abdomen: Soft, nontender, no hepatosplenomegaly, no distention.  Neurologic: Alert and oriented x 3.  Psych: Normal affect. Extremities: No clubbing or cyanosis.   Assessment/Plan: 1.  CAD: History of CAD with multiple stents.  Most recent cath in 4/15 showed patent stents.  No chest pain.  He will continue on ASA 81, Toprol XL, Zetia, and Crestor.  He has had no problems with bleeding so will continue Plavix long-term.  Will get CBC today.  2. Hyperlipidemia: Myalgias with Crestor 20 mg daily but can tolerate Crestor 10 mg daily.  Check lipids today.   3. HTN: BP tends to rise when he is anxious but for the most part is now well-controlled on current regimen. Will check BMET.   Followup in 1 year.  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   Loralie Champagne 12/21/2015

## 2016-03-15 DIAGNOSIS — I252 Old myocardial infarction: Secondary | ICD-10-CM | POA: Diagnosis not present

## 2016-03-15 DIAGNOSIS — Z85118 Personal history of other malignant neoplasm of bronchus and lung: Secondary | ICD-10-CM | POA: Insufficient documentation

## 2016-03-15 DIAGNOSIS — Z7982 Long term (current) use of aspirin: Secondary | ICD-10-CM | POA: Diagnosis not present

## 2016-03-15 DIAGNOSIS — M542 Cervicalgia: Secondary | ICD-10-CM | POA: Diagnosis not present

## 2016-03-15 DIAGNOSIS — Z955 Presence of coronary angioplasty implant and graft: Secondary | ICD-10-CM | POA: Insufficient documentation

## 2016-03-15 DIAGNOSIS — I2510 Atherosclerotic heart disease of native coronary artery without angina pectoris: Secondary | ICD-10-CM | POA: Diagnosis not present

## 2016-03-15 DIAGNOSIS — I10 Essential (primary) hypertension: Secondary | ICD-10-CM | POA: Insufficient documentation

## 2016-03-16 ENCOUNTER — Encounter (HOSPITAL_COMMUNITY): Payer: Self-pay

## 2016-03-16 ENCOUNTER — Emergency Department (HOSPITAL_COMMUNITY): Payer: BLUE CROSS/BLUE SHIELD

## 2016-03-16 ENCOUNTER — Emergency Department (HOSPITAL_COMMUNITY)
Admission: EM | Admit: 2016-03-16 | Discharge: 2016-03-16 | Disposition: A | Discharge status: Home / Self Care | Payer: BLUE CROSS/BLUE SHIELD | Attending: Emergency Medicine | Admitting: Emergency Medicine

## 2016-03-16 DIAGNOSIS — M542 Cervicalgia: Secondary | ICD-10-CM

## 2016-03-16 LAB — BASIC METABOLIC PANEL
ANION GAP: 10 (ref 5–15)
BUN: 22 mg/dL — ABNORMAL HIGH (ref 6–20)
CALCIUM: 9.5 mg/dL (ref 8.9–10.3)
CHLORIDE: 106 mmol/L (ref 101–111)
CO2: 22 mmol/L (ref 22–32)
CREATININE: 1.03 mg/dL (ref 0.61–1.24)
GFR calc non Af Amer: >60 mL/min (ref >60)
Glucose, Bld: 158 mg/dL — ABNORMAL HIGH (ref 65–99)
Potassium: 3.7 mmol/L (ref 3.5–5.1)
SODIUM: 138 mmol/L (ref 135–145)

## 2016-03-16 LAB — CBC
HCT: 45.6 % (ref 39.0–52.0)
HEMOGLOBIN: 15.3 g/dL (ref 13.0–17.0)
MCH: 29.8 pg (ref 26.0–34.0)
MCHC: 33.6 g/dL (ref 30.0–36.0)
MCV: 88.7 fL (ref 78.0–100.0)
PLATELETS: 237 10*3/uL (ref 150–400)
RBC: 5.14 MIL/uL (ref 4.22–5.81)
RDW: 13.5 % (ref 11.5–15.5)
WBC: 10.9 10*3/uL — AB (ref 4.0–10.5)

## 2016-03-16 LAB — I-STAT TROPONIN, ED
TROPONIN I, POC: 0 ng/mL (ref 0.00–0.08)
TROPONIN I, POC: 0 ng/mL (ref 0.00–0.08)

## 2016-03-16 MED ORDER — NITROGLYCERIN 2 % TD OINT
1.0000 [in_us] | TOPICAL_OINTMENT | Freq: Once | TRANSDERMAL | Status: AC
  Administered 2016-03-16: 1 [in_us] via TOPICAL
  Filled 2016-03-16: qty 1

## 2016-03-16 MED ORDER — CYCLOBENZAPRINE HCL 10 MG PO TABS: 5.0000 mg | ORAL_TABLET | Freq: Once | ORAL | Status: DC

## 2016-03-16 MED ORDER — HYDROCODONE-ACETAMINOPHEN 5-325 MG PO TABS: 1.0000 | ORAL_TABLET | Freq: Once | ORAL | Status: DC

## 2016-03-16 MED ORDER — KETOROLAC TROMETHAMINE 15 MG/ML IJ SOLN
15.0000 mg | Freq: Once | INTRAMUSCULAR | Status: AC
  Administered 2016-03-16: 15 mg via INTRAMUSCULAR
  Filled 2016-03-16: qty 1

## 2016-03-16 NOTE — ED Triage Notes (Signed)
Pt here complaining of R neck pain. Pt states hx of cardiac stents. Pt states last time he had this particular neck pain, ended up getting cardiac stents. Pt states pain is similar. Pt denies any chest pain or SOB. Pt denies any nausea/vomiting.

## 2016-03-16 NOTE — ED Provider Notes (Signed)
[facility] DEPT Provider Note   CSN: [id number] Arrival date & time: 03/15/16  2350  By signing my name below, I, Dolores Hoose, attest that this documentation has been prepared under the direction and in the presence of Merryl Hacker, MD . Electronically Signed: Dolores Hoose, Scribe. 03/16/2016. 12:55 AM.  History   Chief Complaint Chief Complaint  Patient presents with  . Neck Pain   HPI  HPI Comments:  [name] [name] is a 55 y.o. male with pmhx of cardiac stents and CA who presents to the Emergency Department complaining of sudden-onset constant unchanged neck pain beginning about 2.5 hours ago. Pt describes his pain as 2/10 that radiates up to the back of his head. He states that his current pain feels similar to the pain he experienced in the past when he had a heart attack 9 years ago and had 2 stents put in place. He also states that he had another put into place 3 years ago. Pt has taken two adult aspirin at home, with no relief. He reports associated shaking.  The pain is not worse with movement. Denies headache. Pt denies any SOB, diaphoresis, nausea, vomiting, numbness or tingling. Denies neurologic symptoms.  Past Medical History:  Diagnosis Date  . ADENOCARCINOMA, SIGMOID COLON    a. s/p resection/chemo  . Allergy    SEASONAL  . Anginal pain ([place])   . BARRETT'S ESOPHAGUS, HX OF   . CAD    a. 2007: DES to LAD and Diag;  b. 08/2011 non-ischemic myoview, EF 60%;  c. 08/2012 NSTEMI/Cath/PCI: LM n, LAD patent stent, D1 small, 80, D2 90 ISR (PTCA), LCX nl, RCA 20d, RPL 95 (2.25x12 Promus Premier DES), EF 55-65%.  . Diverticulosis   . DYSLIPIDEMIA   . GERD (gastroesophageal reflux disease)   . HIATAL HERNIA WITH REFLUX   . Hyperplastic colon polyp   . HYPERTENSION   . Myocardial infarction   . 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 s/p wedge resection  . SLEEP APNEA   . Sleep apnea     Patient Active Problem List   Diagnosis Date Noted  . HTN  (hypertension) 09/12/2013  . CAD (coronary artery disease) 09/12/2013  . Back pain 09/12/2013  . HLD (hyperlipidemia) 09/12/2013  . NSTEMI (non-ST elevated myocardial infarction) ([place]) 08/23/2012  . GERD 06/28/2009  . RECTAL BLEEDING 06/28/2009  . PERSONAL HX COLON CANCER 06/28/2009  . BARRETT'S ESOPHAGUS, HX OF 06/27/2009  . SECONDARY MALIGNANT NEOPLASM OF LUNG 07/21/2007  . DYSLIPIDEMIA 07/21/2007  . Hypertension, essential 07/21/2007  . Coronary atherosclerosis of native coronary artery 07/21/2007  . SLEEP APNEA 07/21/2007  . HIATAL HERNIA WITH REFLUX 03/28/2007  . ADENOCARCINOMA, SIGMOID COLON 12/22/1996    Past Surgical History:  Procedure Laterality Date  . COLON RESECTION  1998  . COLON SURGERY    . CORONARY STENT PLACEMENT  2007   taxus  . KNEE SURGERY    . LEFT HEART CATHETERIZATION WITH CORONARY ANGIOGRAM N/A 08/22/2012   Procedure: LEFT HEART CATHETERIZATION WITH CORONARY ANGIOGRAM;  Surgeon: Peter M Martinique, MD;  Location: [facility] CATH LAB;  Service: Cardiovascular;  Laterality: N/A;  . LEFT HEART CATHETERIZATION WITH CORONARY ANGIOGRAM N/A 09/12/2013   Procedure: LEFT HEART CATHETERIZATION WITH CORONARY ANGIOGRAM;  Surgeon: Troy Sine, MD;  Location: [facility] CATH LAB;  Service: Cardiovascular;  Laterality: N/A;  . LUNG REMOVAL, PARTIAL     LEFT  . TONSILLECTOMY         Home Medications  Prior to Admission medications   Medication Sig Start Date End Date Taking? Authorizing Provider  amLODipine (NORVASC) 5 MG tablet Take 5 mg by mouth 2 (two) times daily.     Historical Provider, MD  aspirin EC 81 MG EC tablet Take 1 tablet (81 mg total) by mouth daily. 09/13/13   Kelvin Cellar, MD  Cholecalciferol (VITAMIN D3) 2000 UNITS capsule Take by mouth. 01/29/12   Historical Provider, MD  clopidogrel (PLAVIX) 75 MG tablet Take 1 tablet (75 mg total) by mouth daily. 08/23/12   Rogelia Mire, NP  cyanocobalamin (,VITAMIN B-12,) 1000 MCG/ML injection Inject into the muscle.  11/23/14   Historical Provider, MD  DEXILANT 60 MG capsule Take 60 mg by mouth daily.  07/19/11   Historical Provider, MD  ezetimibe (ZETIA) 10 MG tablet Take 10 mg by mouth daily.    Historical Provider, MD  Linaclotide Rolan Lipa) 145 MCG CAPS capsule Take 1 capsule (145 mcg total) by mouth daily. 11/02/14   Lafayette Dragon, MD  loratadine (CLARITIN) 10 MG tablet Take 10 mg by mouth. 01/29/12   Historical Provider, MD  metoprolol succinate (TOPROL-XL) 25 MG 24 hr tablet Take 25 mg by mouth daily.    Historical Provider, MD  mometasone (NASONEX) 50 MCG/ACT nasal spray Frequency:QD   Dosage:50   MCG  Instructions:  Note:Dose: 50MCG 01/29/12   Historical Provider, MD  nitroGLYCERIN (NITROSTAT) 0.4 MG SL tablet Place 1 tablet (0.4 mg total) under the tongue every 5 (five) minutes x 3 doses as needed for chest pain. 08/23/12   Rogelia Mire, NP  omega-3 acid ethyl esters (LOVAZA) 1 G capsule Take 1 g by mouth 2 (two) times daily.     Historical Provider, MD  rosuvastatin (CRESTOR) 20 MG tablet Take 20 mg by mouth daily.      Historical Provider, MD    Family History Family History  Problem Relation Age of Onset  . Heart disease Mother     alive @ 24.  s/p cabg  . Colon polyps Mother   . Heart disease Father     alive @ 40.  s/p stenting  . Heart attack Paternal Uncle     @ 39  . Heart attack Paternal Uncle     @ 85  . Colon cancer Neg Hx     Social History Social History  Substance Use Topics  . Smoking status: Never Smoker  . Smokeless tobacco: Never Used  . Alcohol use No     Comment: occas has 4 oz of red wine.     Allergies   Shellfish-derived products   Review of Systems Review of Systems  Constitutional: Negative for diaphoresis.  Respiratory: Negative for shortness of breath.   Cardiovascular: Negative for chest pain.  Gastrointestinal: Negative for nausea and vomiting.  Musculoskeletal: Positive for neck pain. Negative for neck stiffness.  Neurological: Positive for  tremors. Negative for numbness.  All other systems reviewed and are negative.    Physical Exam Updated Vital Signs BP 136/85   Pulse 67   Temp 98.2 F (36.8 C) (Oral)   Resp 20   Ht 5\' 11"  (1.803 m)   Wt 213 lb 3.2 oz (96.7 kg)   SpO2 96%   BMI 29.74 kg/m   Physical Exam  Constitutional: He is oriented to person, place, and time. He appears well-developed and well-nourished. No distress.  HENT:  Head: Normocephalic and atraumatic.  Neck: Normal range of motion. Neck supple.  Cardiovascular: Normal rate, regular rhythm  and normal heart sounds.   No murmur heard. Pulmonary/Chest: Effort normal and breath sounds normal. No respiratory distress. He has no wheezes.  Abdominal: Soft. Bowel sounds are normal. There is no tenderness. There is no rebound.  Musculoskeletal: He exhibits no edema.  Neurological: He is alert and oriented to person, place, and time.  Cranial nerves II through XII intact, fluent speech, normal gait  Skin: Skin is warm and dry.  Psychiatric: He has a normal mood and affect.  Nursing note and vitals reviewed.    ED Treatments / Results  DIAGNOSTIC STUDIES:  Oxygen Saturation is 100% on RA, normal by my interpretation.    COORDINATION OF CARE:  1:04 AM Discussed treatment plan with pt at bedside which includes blood work and pt agreed to plan.  Labs (all labs ordered are listed, but only abnormal results are displayed) Labs Reviewed  BASIC METABOLIC PANEL - Abnormal; Notable for the following:       Result Value   Glucose, Bld 158 (*)    BUN 22 (*)    All other components within normal limits  CBC - Abnormal; Notable for the following:    WBC 10.9 (*)    All other components within normal limits  I-STAT TROPOININ, ED  I-STAT TROPOININ, ED    EKG  EKG Interpretation  Date/Time:  Monday March 16 2016 00:03:52 EDT Ventricular Rate:  86 PR Interval:  156 QRS Duration: 90 QT Interval:  362 QTC Calculation: 433 R Axis:   39 Text  Interpretation:  Normal sinus rhythm Normal ECG Confirmed by HORTON  MD, COURTNEY ([id number]) on 03/16/2016 1:03:55 AM       Radiology Dg Chest 2 View  Result Date: 03/16/2016 CLINICAL DATA:  Acute onset of right-sided neck pain. Initial encounter. EXAM: CHEST  2 VIEW COMPARISON:  Chest radiograph performed 07/17/2014 FINDINGS: The lungs are well-aerated. Mild scarring is noted at the medial left lung base. There is no evidence of focal opacification, pleural effusion or pneumothorax. The heart is normal in size; the mediastinal contour is within normal limits. No acute osseous abnormalities are seen. IMPRESSION: No acute cardiopulmonary process seen. Electronically Signed   By: Garald Balding M.D.   On: 03/16/2016 00:36    Procedures Procedures (including critical care time)  Medications Ordered in ED Medications  HYDROcodone-acetaminophen (NORCO/VICODIN) 5-325 MG per tablet 1 tablet (1 tablet Oral Not Given 03/16/16 0340)  cyclobenzaprine (FLEXERIL) tablet 5 mg (5 mg Oral Refused 03/16/16 0341)  nitroGLYCERIN (NITROGLYN) 2 % ointment 1 inch (1 inch Topical Given 03/16/16 0149)  ketorolac (TORADOL) 15 MG/ML injection 15 mg (15 mg Intramuscular Given 03/16/16 0423)     Initial Impression / Assessment and Plan / ED Course  I have reviewed the triage vital signs and the nursing notes.  Pertinent labs & imaging results that were available during my care of the patient were reviewed by me and considered in my medical decision making (see chart for details).  Clinical Course  Comment By Time  Initial workup reassuring. Patient's neck pain 1 out of 10. Very atypical. No associated chest pain but patient states similar to prior pain when he had 2 stents placed. Offered admission versus repeat troponin at 4 hours given atypical nature symptoms. Patient would like to repeat troponin and avoid admission if possible. Dosed additional pain medication. Merryl Hacker, MD 10/30 0215    Patient  presents with neck pain. Reports similar pain when he had stent placement 9 years ago. Denies chest pain  or shortness of breath. Does not appear musculoskeletal in nature as it is not worse with movement. Denies headache or neurologic symptoms. Chest x-ray, EKG and initial troponin are reassuring. Minimal improvement with nitroglycerin. Patient was given Toradol. See clinical course above. While very atypical for ACS, given similar symptoms in the past with stent placement, I did offer admission to the patient versus the need observation and repeat troponin. Patient elected for repeat troponin. He remains nontoxic in the ER with improving pain with anti-inflammatories. Repeat troponin negative. Etiology pain and noted this time. Follow-up closely with primary physician and cardiology. If new or worsening symptoms he needs to be reevaluated immediately.   After history, exam, and medical workup I feel the patient has been appropriately medically screened and is safe for discharge home. Pertinent diagnoses were discussed with the patient. Patient was given return precautions.  Final Clinical Impressions(s) / ED Diagnoses   Final diagnoses:  Neck pain    New Prescriptions New Prescriptions   No medications on file   I personally performed the services described in this documentation, which was scribed in my presence. The recorded information has been reviewed and is accurate.     Merryl Hacker, MD 03/16/16 [phone]

## 2016-03-16 NOTE — Discharge Instructions (Signed)
You were seen today for neck pain. The cause of your pain is unknown at this time. Because of similar pain when you had stent placement, cardiac testing was obtained and is reassuring at this time. However, you need to follow-up closely with your cardiologist. If pain worsens or persists you need to be reevaluated. If he developed weakness, numbness, tingling of the upper extremities or any neurologic symptoms you also need to be reevaluated.

## 2016-03-16 NOTE — ED Notes (Signed)
Nitro patch removed from patient at this time.

## 2016-08-21 ENCOUNTER — Ambulatory Visit: Payer: BLUE CROSS/BLUE SHIELD | Admitting: Internal Medicine

## 2016-10-02 ENCOUNTER — Encounter: Payer: Self-pay | Admitting: Internal Medicine

## 2016-10-02 ENCOUNTER — Ambulatory Visit (INDEPENDENT_AMBULATORY_CARE_PROVIDER_SITE_OTHER): Payer: BLUE CROSS/BLUE SHIELD | Admitting: Internal Medicine

## 2016-10-02 VITALS — BP 120/70 | HR 51 | Ht 72.0 in | Wt 202.4 lb

## 2016-10-02 DIAGNOSIS — I10 Essential (primary) hypertension: Secondary | ICD-10-CM

## 2016-10-02 DIAGNOSIS — I2510 Atherosclerotic heart disease of native coronary artery without angina pectoris: Secondary | ICD-10-CM

## 2016-10-02 NOTE — Progress Notes (Signed)
Follow-up Outpatient Visit Date: 10/02/2016  Primary Care Provider: Tobe Sos, MD [address] [place] [address]  Chief Complaint: Follow-up coronary artery disease  HPI:  [name] [name] is a 56 y.o. year-old male with history of coronary artery disease, hypertension, hyperlipidemia, and colon cancer, who presents for follow-up of CAD. He was previously followed in our office by Dr Aundra Dubin, having last been seen in 12/2015. He has a remote history of PCI and presented to [facility] with NSTEMI in 08/2012. At that time, left heart catheterization revealed 90% in-stent restenosis involving D2 as well as 95% lesion in PL branch. He underwent angioplasty of D2 and drug-eluting stent placement to PL branch. Recurrent chest pain a year later led to repeat catheterization that demonstrated patent stents and only mild luminal irregularities elsewhere.  Today, [name] [name] reports feeling well. He has not had any chest pain since his last visit with [place]. He notes occasional back pain from driving a lot. He reports one episode of exertional dyspnea a few weeks ago when hiking up a mountain. He notes that he otherwise has been at his baseline as far as breathing goes. At the time of his stent placements, he had chest and left arm pain with accompanying shortness of breath as well as "jitteriness." He has not had any symptoms reminiscent of this recently. He remains compliant with his medications. He began taking a supplement with vitamin D and vitamin K recently, which led to a rash. He has since stopped taking this. He denies orthopnea, PND, and palpitations. He notes rare episodes of lightheadedness, which she attributes to seasonal allergies. He is tolerating CPAP at night, which she uses on a regular basis. He also is tolerating rosuvastatin 10 mg daily without myalgias.  --------------------------------------------------------------------------------------------------  Cardiovascular History &  Procedures: Cardiovascular Problems:  Coronary artery disease status post NSTEMI and PCI  Risk Factors:  Known CAD, hypertension, hyperlipidemia, male gender, and age greater than 25  Cath/PCI:  LHC (09/12/13): LMCA normal. Normal LAD with patent mid vessel stent. D2 with 20-30% stenosis proximal to patent stent. LCx without significant disease. RCA with 20-30% disease. 20% ostial PDA stenosis. Patent stent in PL branch.  LHC/PCI (08/22/12): LMCA normal. LAD with patent mid vessel stent. Small first diagonal branch with 80% ostial stenosis. Large second diagonal branch with 90% in-stent restenosis. LCx normal. Large, dominant RCA with mild luminal irregularities in the mid and distal vessel. PL branch has a 95% focal stenosis. LVEF 55-65%. Successful PTCA to D2 stent with AngioSculpt 2.5 x 10 mm balloon. Successful PCI to PL branch with a Promus Premier 2.25 x 12 mm drug-eluting stent.  CV Surgery:  None  EP Procedures and Devices:  None  Non-Invasive Evaluation(s):  Exercise myocardial perfusion stress test (09/14/11): Good exercise capacity (9 minutes, 0 seconds). No significant ST-T changes. Occasional PVCs noted. Normal myocardial perfusion without evidence of ischemia or scar. LVEF 60%.  Recent CV Pertinent Labs: Lab Results  Component Value Date   CHOL 125 12/20/2015   HDL 32 (L) 12/20/2015   LDLCALC 65 12/20/2015   TRIG 142 12/20/2015   CHOLHDL 3.9 12/20/2015   INR 1.05 09/12/2013   K 3.7 03/16/2016   BUN 22 (H) 03/16/2016   CREATININE 1.03 03/16/2016   CREATININE 0.88 12/20/2015    Past medical and surgical history were reviewed and updated in EPIC.  Outpatient Encounter Prescriptions as of 10/02/2016  Medication Sig  . amLODipine (NORVASC) 5 MG tablet Take 5 mg by mouth 2 (  two) times daily.   Marland Kitchen aspirin EC 81 MG EC tablet Take 1 tablet (81 mg total) by mouth daily.  . Cholecalciferol (VITAMIN D3) 2000 UNITS capsule Take by mouth.  . clopidogrel (PLAVIX) 75 MG  tablet Take 1 tablet (75 mg total) by mouth daily.  . cyanocobalamin (,VITAMIN B-12,) 1000 MCG/ML injection Inject into the muscle.  Marland Kitchen DEXILANT 60 MG capsule Take 60 mg by mouth daily.   Marland Kitchen ezetimibe (ZETIA) 10 MG tablet Take 10 mg by mouth daily.  . Linaclotide (LINZESS) 145 MCG CAPS capsule Take 1 capsule (145 mcg total) by mouth daily.  Marland Kitchen loratadine (CLARITIN) 10 MG tablet Take 10 mg by mouth.  . metoprolol succinate (TOPROL-XL) 25 MG 24 hr tablet Take 25 mg by mouth daily.  . mometasone (NASONEX) 50 MCG/ACT nasal spray Frequency:QD   Dosage:50   MCG  Instructions:  Note:Dose: 50MCG  . nitroGLYCERIN (NITROSTAT) 0.4 MG SL tablet Place 1 tablet (0.4 mg total) under the tongue every 5 (five) minutes x 3 doses as needed for chest pain.  Marland Kitchen omega-3 acid ethyl esters (LOVAZA) 1 G capsule Take 1 g by mouth 2 (two) times daily.   . rosuvastatin (CRESTOR) 20 MG tablet Take 20 mg by mouth daily.     No facility-administered encounter medications on file as of 10/02/2016.     Allergies: Shellfish-derived products  Social History   Social History  . Marital status: Married    Spouse name: N/A  . Number of children: N/A  . Years of education: N/A   Occupational History  . Not on file.   Social History Main Topics  . Smoking status: Never Smoker  . Smokeless tobacco: Never Used  . Alcohol use No     Comment: occas has 4 oz of red wine.  . Drug use: No  . Sexual activity: Yes   Other Topics Concern  . Not on file   Social History Narrative   Lives in [place], [place] with his wife and son.  Works for [employer] in [employer].    Family History  Problem Relation Age of Onset  . Heart disease Mother        alive @ 44.  s/p cabg  . Colon polyps Mother   . Heart disease Father        alive @ 52.  s/p stenting  . Heart attack Paternal Uncle        @ 99  . Heart attack Paternal Uncle        @ 81  . Colon cancer Neg Hx     Review of Systems: Patient has residual cough following flu in  early April. He has chronic back pain and notes easy bruising. Otherwise, a 12-system review of systems was performed and was negative except as noted in the HPI.  --------------------------------------------------------------------------------------------------  Physical Exam: BP 120/70   Pulse (!) 51   Ht 6' (1.829 m)   Wt 202 lb 6.4 oz (91.8 kg)   BMI 27.45 kg/m   General:  Well-developed, well-nourished man, seated comfortably in the exam room. He is accompanied by his wife. HEENT: No conjunctival pallor or scleral icterus.  Moist mucous membranes.  OP clear. Neck: Supple without lymphadenopathy, thyromegaly, JVD, or HJR. Lungs: Normal work of breathing.  Clear to auscultation bilaterally without wheezes or crackles. Heart: Bradycardic but regular without murmurs, rubs, or gallops.  Non-displaced PMI. Abd: Bowel sounds present.  Soft, NT/ND without hepatosplenomegaly Ext: No lower extremity edema.  Radial, PT, and DP  pulses are 2+ bilaterally. Skin: warm and dry without rash  EKG:  Sinus bradycardia (heart rate 51 bpm) without significant abnormalities.  Lab Results  Component Value Date   WBC 10.9 (H) 03/16/2016   HGB 15.3 03/16/2016   HCT 45.6 03/16/2016   MCV 88.7 03/16/2016   PLT 237 03/16/2016    Lab Results  Component Value Date   NA 138 03/16/2016   K 3.7 03/16/2016   CL 106 03/16/2016   CO2 22 03/16/2016   BUN 22 (H) 03/16/2016   CREATININE 1.03 03/16/2016   GLUCOSE 158 (H) 03/16/2016   ALT 20 09/12/2013    Lab Results  Component Value Date   CHOL 125 12/20/2015   HDL 32 (L) 12/20/2015   LDLCALC 65 12/20/2015   TRIG 142 12/20/2015   CHOLHDL 3.9 12/20/2015   --------------------------------------------------------------------------------------------------  ASSESSMENT AND PLAN: Coronary artery disease without angina [name] [name] is doing well without angina. His episode of exertional dyspnea while hiking in the mountains is nonspecific. The patient  feels that this was most likely a result of deconditioning, as he does not exercise on a regular basis. I have asked him to contact us if he has worsening dyspnea or develops chest pain so that we can consider ischemia evaluation. I will have him decrease aspirin to 81 mg daily. Otherwise, we will not make any medication changes.He is tolerating modest sinus bradycardia at rest well.  Hyperlipidemia [name] [name] is tolerating 10 mg of rosuvastatin well. He experienced significant myalgias with higher doses. His LDL last August was acceptable at 65. We will continue his current regimen.  Follow-up: Return to clinic in 6 months.  Nelva Bush, MD 10/03/2016 11:50 AM

## 2016-10-02 NOTE — Patient Instructions (Signed)
Medication Instructions:  Take aspirin 81 mg daily.  Labwork: None  Testing/Procedures: None   Follow-Up: Your physician wants you to follow-up in: 6 months with Dr End. November 2018). You will receive a reminder letter in the mail two months in advance. If you don't receive a letter, please call our office to schedule the follow-up appointment.        If you need a refill on your cardiac medications before your next appointment, please call your pharmacy.

## 2016-10-03 ENCOUNTER — Encounter: Payer: Self-pay | Admitting: Internal Medicine

## 2017-04-15 ENCOUNTER — Other Ambulatory Visit: Payer: Self-pay | Admitting: Surgery

## 2017-04-16 ENCOUNTER — Telehealth: Payer: Self-pay

## 2017-04-16 NOTE — Telephone Encounter (Signed)
   [facility] Pre-operative Risk Assessment    Request for surgical clearance:  1. What type of surgery is being performed? Bilateral inguinal hernia repair  2. When is this surgery scheduled? Not determined  3. Are there any medications that need to be held prior to surgery and how long? Plavix   4. Practice name and name of physician performing surgery? [facility], Dr. Coralie Keens  5. What is your office phone and fax number? OTRRN:[phone] Fax: [phone]  6. Anesthesia type (None, local, MAC, general) ? General anesthesia   Mendel Ryder 04/16/2017, 2:03 PM  _________________________________________________________________   (provider comments below)

## 2017-04-19 NOTE — Telephone Encounter (Signed)
   Primary Cardiologist: Dr. Saunders Revel  Chart reviewed as part of pre-operative protocol coverage. Because of [name] [name]'s past medical history and time since last visit, he/she will require a follow-up visit in order to better assess preoperative cardiovascular risk.  Pre-op covering staff: - Please schedule appointment and call patient to inform them. - Please contact requesting surgeon's office via preferred method (i.e, phone, fax) to inform them of need for appointment prior to surgery.  [place], [place]  04/19/2017, 3:21 PM

## 2017-04-20 NOTE — Telephone Encounter (Addendum)
Patient requests Thursday or Fridays for work schedule. Arrange OV for pre-op evaluation Friday, December 14 with Cecilie Kicks, NP. He was grateful for call and agrees with treatment plan.  Faxed via Epic to [employer] [employer] as Juluis Rainier.

## 2017-04-28 ENCOUNTER — Encounter: Payer: Self-pay | Admitting: Cardiology

## 2017-04-28 NOTE — Progress Notes (Signed)
Cardiology Office Note   Date:  04/30/2017   ID:  [name] [name], DOB 1960-09-11, MRN [id number]  PCP:  Tobe Sos, MD  Cardiologist:  Dr. Saunders Revel    Chief Complaint  Patient presents with  . Pre-op Exam      History of Present Illness: [name] [name] is a 56 y.o. male who presents for pre-op eval. For bilateral ing hernia repair by Dr. Ninfa Linden.  If Plavix can be held.   He has a hx of coronary artery disease, hypertension, hyperlipidemia, and colon cancer, remote history of PCI and presented to [facility] with NSTEMI in 08/2012. At that time, left heart catheterization revealed 90% in-stent restenosis involving D2 as well as 95% lesion in PL branch. He underwent angioplasty of D2 and drug-eluting stent placement to PL branch. Recurrent chest pain a year later led to repeat catheterization that demonstrated patent stents and only mild luminal irregularities elsewhere.  Uses CPAP at night for sleep apnea.   Today he is here to be evaluated for bil ing hernia repair.  Date is not yet set. He denies any chest pain or SOB.  He is able to walk up multiple sets of steps without problems.  He shoveled his driveway this past Monday with 19 inches snow.  No chest pain and no SOB.  He has no lightheadedness.  His pulse here today is at 77 and with activity he tells me it is up to 60s.  Previous EKG with HR 51.  He is having more pain from hernias and plans for surgery second week in Jan.  2019.    Past Medical History:  Diagnosis Date  . ADENOCARCINOMA, SIGMOID COLON    a. s/p resection/chemo  . Allergy    SEASONAL  . Anginal pain ([place])   . BARRETT'S ESOPHAGUS, HX OF   . CAD    a. 2007: DES to LAD and Diag;  b. 08/2011 non-ischemic myoview, EF 60%;  c. 08/2012 NSTEMI/Cath/PCI: LM n, LAD patent stent, D1 small, 80, D2 90 ISR (PTCA), LCX nl, RCA 20d, RPL 95 (2.25x12 Promus Premier DES), EF 55-65%.  . Diverticulosis   . DYSLIPIDEMIA   . GERD (gastroesophageal reflux disease)   .  HIATAL HERNIA WITH REFLUX   . Hyperplastic colon polyp   . HYPERTENSION   . Myocardial infarction ([place])   . 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 s/p wedge resection  . SLEEP APNEA   . Sleep apnea     Past Surgical History:  Procedure Laterality Date  . COLON RESECTION  1998  . COLON SURGERY    . CORONARY STENT PLACEMENT  2007   taxus  . KNEE SURGERY    . LEFT HEART CATHETERIZATION WITH CORONARY ANGIOGRAM N/A 08/22/2012   Procedure: LEFT HEART CATHETERIZATION WITH CORONARY ANGIOGRAM;  Surgeon: Peter M Martinique, MD;  Location: [facility] CATH LAB;  Service: Cardiovascular;  Laterality: N/A;  . LEFT HEART CATHETERIZATION WITH CORONARY ANGIOGRAM N/A 09/12/2013   Procedure: LEFT HEART CATHETERIZATION WITH CORONARY ANGIOGRAM;  Surgeon: Troy Sine, MD;  Location[facility] C[facility] LAB;  Service: Cardiovascular;  Laterality: N/A;  . LUNG REMOVAL, PARTIAL     LEFT  . TONSILLECTOMY       Current Outpatient Medications  Medication Sig Dispense Refill  . amLODipine (NORVASC) 5 MG tablet Take 5 mg by mouth 2 (two) times daily.     Marland Kitchen aspirin 325 MG tablet Take 325 mg by mouth daily.    Marland Kitchen  Cholecalciferol (VITAMIN D3) 2000 UNITS capsule Take 2,000 Units by mouth daily.     . clopidogrel (PLAVIX) 75 MG tablet Take 1 tablet (75 mg total) by mouth daily. 90 tablet 3  . cyanocobalamin (,VITAMIN B-12,) 1000 MCG/ML injection Inject 1,000 mcg into the muscle every 30 (thirty) days.     Marland Kitchen DEXILANT 60 MG capsule Take 60 mg by mouth daily.     Marland Kitchen ezetimibe (ZETIA) 10 MG tablet Take 10 mg by mouth daily.    Marland Kitchen loratadine (CLARITIN) 10 MG tablet Take 10 mg by mouth daily as needed for allergies.     . metoprolol succinate (TOPROL-XL) 25 MG 24 hr tablet Take 25 mg by mouth daily.    Marland Kitchen omega-3 acid ethyl esters (LOVAZA) 1 G capsule Take 1 g by mouth 2 (two) times daily.     . rosuvastatin (CRESTOR) 20 MG tablet TAKE 1/2 TABLET (10 MG TOTAL) BY MOUTH ONCE DAILY     No current facility-administered medications for  this visit.     Allergies:   Shellfish-derived products    Social History:  The patient  reports that  has never smoked. he has never used smokeless tobacco. He reports that he does not drink alcohol or use drugs.   Family History:  The patient's family history includes Colon polyps in his mother; Heart attack in his paternal uncle and paternal uncle; Heart disease in his father and mother.    ROS:  General:no colds or fevers, + weight loss Skin:no rashes or ulcers HEENT:no blurred vision, no congestion CV:see HPI PUL:see HPI GI:no diarrhea constipation or melena, no indigestion GU:no hematuria, no dysuria MS:no joint pain, no claudication Neuro:no syncope, no lightheadedness Endo:no diabetes, no thyroid disease  Wt Readings from Last 3 Encounters:  04/30/17 196 lb (88.9 kg)  10/02/16 202 lb 6.4 oz (91.8 kg)  03/16/16 213 lb 3.2 oz (96.7 kg)     PHYSICAL EXAM: VS:  BP 120/70   Pulse (!) 54   Ht 6' (1.829 m)   Wt 196 lb (88.9 kg)   SpO2 99%   BMI 26.58 kg/m  , BMI Body mass index is 26.58 kg/m. General:Pleasant affect, NAD Skin:Warm and dry, brisk capillary refill HEENT:normocephalic, sclera clear, mucus membranes moist Neck:supple, no JVD, no bruits  Heart:S1S2 RRR without murmur, gallup, rub or click Lungs:clear without rales, rhonchi, or wheezes OQH:UTML, non tender, + BS, do not palpate liver spleen or masses Ext:no lower ext edema, 2+ pedal pulses, 2+ radial pulses Neuro:alert and oriented X 3, MAE, follows commands, + facial symmetry    EKG:  EKG is ordered today. The ekg ordered today demonstrates Sinus Brady at 49 otherwise normal EKG.  No change from May.    Recent Labs: No results found for requested labs within last 8760 hours.    Lipid Panel    Component Value Date/Time   CHOL 125 12/20/2015 1448   TRIG 142 12/20/2015 1448   HDL 32 (L) 12/20/2015 1448   CHOLHDL 3.9 12/20/2015 1448   VLDL 28 12/20/2015 1448   LDLCALC 65 12/20/2015 1448         Other studies Reviewed: Additional studies/ records that were reviewed today include: . Cardiovascular Problems:  Coronary artery disease status post NSTEMI and PCI  Risk Factors:  Known CAD, hypertension, hyperlipidemia, male gender, and age greater than 77  Cath/PCI:  LHC (09/12/13): LMCA normal. Normal LAD with patent mid vessel stent. D2 with 20-30% stenosis proximal to patent stent. LCx without significant disease.  RCA with 20-30% disease. 20% ostial PDA stenosis. Patent stent in PL branch.  LHC/PCI (08/22/12): LMCA normal. LAD with patent mid vessel stent. Small first diagonal branch with 80% ostial stenosis. Large second diagonal branch with 90% in-stent restenosis. LCx normal. Large, dominant RCA with mild luminal irregularities in the mid and distal vessel. PL branch has a 95% focal stenosis. LVEF 55-65%. Successful PTCA to D2 stent with AngioSculpt 2.5 x 10 mm balloon. Successful PCI to PL branch with a Promus Premier 2.25 x 12 mm drug-eluting stent.  CV Surgery:  None  EP Procedures and Devices:  None  Non-Invasive Evaluation(s):  Exercise myocardial perfusion stress test (09/14/11): Good exercise capacity (9 minutes, 0 seconds). No significant ST-T changes. Occasional PVCs noted. Normal myocardial perfusion without evidence of ischemia or scar. LVEF 60%.   ASSESSMENT AND PLAN:  1.  Pre-op exam with need for bilateral hernia repair.  Pt without chest pain and activity level > 4 mets without symptoms. His prior stents are 56 years old.  He should be stable off plavix for 5 days.  His ASA was to be decreased to 81 mg on last visit he has not yet done reminded today.   Chart reviewed as part of pre-operative protocol coverage. Given past medical history and time since last visit, based on ACC/AHA guidelines, [name] [name] would be at acceptable risk for the planned procedure without further cardiovascular testing.  He may hold Plavix for 5 days prior to procedure  and resume per surgery once stable.  I will route this recommendation to the requesting party via Epic fax function and remove from pre-op pool.  Please call with questions.   2.  CAD with hx of stents due to NSTEMI in 2014 stable without pain or SOB.  3.  Sinus brady asymptomatic and with activity HR is up to 60s.  He does take BB at night to prevent fatigue during the day.      Current medicines are reviewed with the patient today.  The patient Has no concerns regarding medicines.  The following changes have been made:  See above Labs/ tests ordered today include:see above  Disposition:   FU:  see above  Signed, Cecilie Kicks, NP  04/30/2017 11:05 AM    [facility], [place] [place] [place], [place] Phone: [phone]; Fax: [phone]

## 2017-04-30 ENCOUNTER — Encounter: Payer: Self-pay | Admitting: Cardiology

## 2017-04-30 ENCOUNTER — Ambulatory Visit (INDEPENDENT_AMBULATORY_CARE_PROVIDER_SITE_OTHER): Payer: BLUE CROSS/BLUE SHIELD | Admitting: Cardiology

## 2017-04-30 VITALS — BP 120/70 | HR 54 | Ht 72.0 in | Wt 196.0 lb

## 2017-04-30 DIAGNOSIS — I214 Non-ST elevation (NSTEMI) myocardial infarction: Secondary | ICD-10-CM

## 2017-04-30 DIAGNOSIS — R001 Bradycardia, unspecified: Secondary | ICD-10-CM | POA: Diagnosis not present

## 2017-04-30 DIAGNOSIS — I2510 Atherosclerotic heart disease of native coronary artery without angina pectoris: Secondary | ICD-10-CM | POA: Diagnosis not present

## 2017-04-30 DIAGNOSIS — Z01818 Encounter for other preprocedural examination: Secondary | ICD-10-CM

## 2017-04-30 MED ORDER — ASPIRIN EC 81 MG PO TBEC: 81.0000 mg | DELAYED_RELEASE_TABLET | Freq: Every day | ORAL | 3 refills | Status: DC

## 2017-04-30 NOTE — Patient Instructions (Addendum)
Medication Instructions:  Your physician has recommended you make the following change in your medication: 1.  DECREASE the Aspirin to 81 mg daily  Labwork: None ordered  Testing/Procedures: None ordered  Follow-Up: Your physician wants you to follow-up in: [place] DR. END   You will receive a reminder letter in the mail two months in advance. If you don't receive a letter, please call our office to schedule the follow-up appointment.   Any Other Special Instructions Will Be Listed Below (If Applicable).     If you need a refill on your cardiac medications before your next appointment, please call your pharmacy.

## 2017-05-25 ENCOUNTER — Other Ambulatory Visit: Payer: Self-pay | Admitting: Surgery

## 2017-05-26 NOTE — Pre-Procedure Instructions (Signed)
[name] [name]  05/26/2017      [facility] [place], [address] AT [address] [address] Phone: [phone] Fax: [phone]    Your procedure is scheduled on May 31, 2017.  Report to [facility] Admitting at 07:00 A.M.  Call this number if you have problems the morning of surgery:  [phone]   Remember:  Do not eat food or drink liquids after midnight.  Take these medicines the morning of surgery with A SIP OF WATER :  Amlodipine (Norvasc) Metoprolol (Toprol-XL) Dexilant Allergy medicine if needed  STOP your Plavix 5 days prior to surgery as instructed by your doctor.  7 days prior to surgery STOP taking any Aspirin(unless otherwise instructed by your surgeon), Aleve, Naproxen, Ibuprofen, Motrin, Advil, Goody's, BC's, all herbal medications, fish oil, and all vitamins.    Do not wear jewelry.  Do not wear lotions, powders, or perfumes, or deoderant.  Do not shave 48 hours prior to surgery.  Men may shave face and neck.  Do not bring valuables to the hospital.   [facility] is not responsible for any belongings or valuables.  Contacts, dentures or bridgework may not be worn into surgery.  Leave your suitcase in the car.  After surgery it may be brought to your room.  For patients admitted to the hospital, discharge time will be determined by your treatment team.  Patients discharged the day of surgery will not be allowed to drive home.   Special instructions:   [place]- Preparing For Surgery  Before surgery, you can play an important role. Because skin is not sterile, your skin needs to be as free of germs as possible. You can reduce the number of germs on your skin by washing with CHG (chlorahexidine gluconate) Soap before surgery.  CHG is an antiseptic cleaner which kills germs and bonds with the skin to continue killing germs even after washing.  Please  do not use if you have an allergy to CHG or antibacterial soaps. If your skin becomes reddened/irritated stop using the CHG.  Do not shave (including legs and underarms) for at least 48 hours prior to first CHG shower. It is OK to shave your face.  Please follow these instructions carefully.   1. Shower the NIGHT BEFORE SURGERY and the MORNING OF SURGERY with CHG.   2. If you chose to wash your hair, wash your hair first as usual with your normal shampoo.  3. After you shampoo, rinse your hair and body thoroughly to remove the shampoo.  4. Use CHG as you would any other liquid soap. You can apply CHG directly to the skin and wash gently with a scrungie or a clean washcloth.   5. Apply the CHG Soap to your body ONLY FROM THE NECK DOWN.  Do not use on open wounds or open sores. Avoid contact with your eyes, ears, mouth and genitals (private parts). Wash Face and genitals (private parts)  with your normal soap.  6. Wash thoroughly, paying special attention to the area where your surgery will be performed.  7. Thoroughly rinse your body with warm water from the neck down.  8. DO NOT shower/wash with your normal soap after using and rinsing off the CHG Soap.  9. Pat yourself dry with a CLEAN TOWEL.  10. Wear CLEAN PAJAMAS to bed the night before surgery, wear comfortable clothes the morning  of surgery  11. Place CLEAN SHEETS on your bed the night of your first shower and DO NOT SLEEP WITH PETS.    Day of Surgery: Do not apply any deodorants/lotions. Please wear clean clothes to the hospital/surgery center.      Please read over the following fact sheets that you were given. Coughing and Deep Breathing and Surgical Site Infection Prevention

## 2017-05-27 ENCOUNTER — Encounter (HOSPITAL_COMMUNITY)
Admission: RE | Admit: 2017-05-27 | Discharge: 2017-05-27 | Disposition: A | Discharge status: Home / Self Care | Payer: BLUE CROSS/BLUE SHIELD | Source: Ambulatory Visit | Attending: Surgery | Admitting: Surgery

## 2017-05-27 ENCOUNTER — Encounter (HOSPITAL_COMMUNITY): Payer: Self-pay

## 2017-05-27 DIAGNOSIS — Z01812 Encounter for preprocedural laboratory examination: Secondary | ICD-10-CM | POA: Insufficient documentation

## 2017-05-27 LAB — BASIC METABOLIC PANEL
ANION GAP: 9 (ref 5–15)
BUN: 14 mg/dL (ref 6–20)
CO2: 27 mmol/L (ref 22–32)
CREATININE: 0.79 mg/dL (ref 0.61–1.24)
Calcium: 9.7 mg/dL (ref 8.9–10.3)
Chloride: 103 mmol/L (ref 101–111)
Glucose, Bld: 93 mg/dL (ref 65–99)
Potassium: 4.3 mmol/L (ref 3.5–5.1)
SODIUM: 139 mmol/L (ref 135–145)

## 2017-05-27 LAB — CBC
HCT: 44.6 % (ref 39.0–52.0)
HEMOGLOBIN: 14.8 g/dL (ref 13.0–17.0)
MCH: 30.1 pg (ref 26.0–34.0)
MCHC: 33.2 g/dL (ref 30.0–36.0)
MCV: 90.7 fL (ref 78.0–100.0)
PLATELETS: 230 10*3/uL (ref 150–400)
RBC: 4.92 MIL/uL (ref 4.22–5.81)
RDW: 13.1 % (ref 11.5–15.5)
WBC: 8.4 10*3/uL (ref 4.0–10.5)

## 2017-05-28 NOTE — Progress Notes (Signed)
Anesthesia Chart Review:  Pt is a 57 year old male scheduled for bilateral laparoscopic inguinal hernia repair with mesh on 05/31/2017 with Coralie Keens, MD  - PCP Ralph Leyden, MD - Cardiologist is Nelva Bush. Cleared for surgery at last office visit 04/30/17 with Cecilie Kicks, NP - Oncologist is Su Grand, MD (notes in care everywhere)   PMH includes:  CAD (DES to LAD and diagonal 2007; angioplasty to D2 in-stent stenosis, DES to RPL 2014), OSA, colon cancer (s/p resection 1997) with mets to lung (s/p resection 2000), GERD. Never smoker. BMI 27.5  Medications include: Amlodipine, ASA 81 mg, Plavix, dexilant, enalapril, Zetia, metoprolol, rosuvastatin. Pt to hold plavix 5 days before surgery  BP 140/84   Pulse 61   Temp 36.6 C   Resp 20   Ht 5\' 11"  (1.803 m)   Wt 197 lb 9.6 oz (89.6 kg)   SpO2 100%   BMI 27.56 kg/m   Preoperative labs reviewed.   - PT/INR will be obtained day of surgery  EKG 04/30/17: Sinus bradycardia (49 bpm)  Cardiac cath 09/12/13:  - No significant residual coronary obstructive disease with evidence for a widely patent mid LAD stent after the takeoff of the second diagonal vessel with 20-30% mild narrowing in the proximal segment of the in the diagonal vessel - Normal left circumflex coronary artery - Mild 20-30% mid RCA stenosis with 20% ostial PDA stenosis and a widely patent stent in the PLA branch of the right coronary artery.  If no changes, I anticipate pt can proceed with surgery as scheduled.   Willeen Cass, FNP-BC [facility]/Anesthesiology Phone: [phone] 05/28/2017 10:17 AM

## 2017-05-30 NOTE — H&P (Signed)
[name] [name]  Location: [facility] Surgery Patient #: [id number] DOB: May 20, 1960 Married / Language: English / Race: White Male   History of Present Illness (Merve Hotard A. Ninfa Linden MD;   The patient is a 57 year old male who presents with an inguinal hernia. This gentleman is referred here by Dr. Ralph Leyden for evaluation of a symptomatic right inguinal hernia. The patient reports he is pulling something at work last week when he noticed a pulling sensation in the groin and then a bulge. He reports since then he's had some mild discomfort but it easily reduces. He has a prior history of colon cancer surveillance MRIs. MRIs have shown a small hernia in the past in the right inguinal area. He has no obstuctive symptoms. He is moving his bowels well and voiding well. He has a significant cardiac history of having had an MI and has 3 cardiac stents and is currently on Plavix. He was last seen by his cardiologist in May 2018 is been doing very well. He exercises routinely and is otherwise without complaints.   Allergies  SHELLFISH  Allergies Reconciled   Medication History  Medications Reconciled AmLODIPine Besylate (5MG  Tablet, Oral) Active. Omega 3 (1000MG  Capsule, Oral) Active. Rosuvastatin Calcium (20MG  Tablet, Oral) Active. Zetia (10MG  Tablet, Oral) Active. Aspirin (81MG  Tablet, Oral) Active. Enalapril Maleate (10MG  Tablet, Oral) Active. Clopidogrel Bisulfate (75MG  Tablet, Oral) Active. Loratadine (10MG  Tablet, Oral) Active. Mometasone Furoate (50MCG/ACT Suspension, Nasal) Active. Dexilant (60MG  Capsule DR, Oral) Active. Cyanocobalamin (1000MCG/ML Solution, Injection) Active. Fexofenadine HCl (180MG  Tablet, Oral) Active. LevoFLOXacin (500MG  Tablet, Oral) Active. Montelukast Sodium (10MG  Tablet, Oral) Active. Omega-3-acid Ethyl Esters (1GM Capsule, Oral) Active. Toprol XL (25MG  Tablet ER 24HR, Oral) Active. IBU (800MG  Tablet, Oral)  Active.  Vitals   Weight: 198.2 lb Height: 71in Body Surface Area: 2.1 m Body Mass Index: 27.64 kg/m  Temp.: 98.10F  Pulse: 99 (Regular)  BP: 160/85 (Sitting, Left Arm, Standard)     Physical Exam  General Mental Status-Alert. General Appearance-Consistent with stated age. Hydration-Well hydrated. Voice-Normal.  Head and Neck Head-normocephalic, atraumatic with no lesions or palpable masses. Trachea-midline.  Eye Eyeball - Bilateral-Extraocular movements intact. Sclera/Conjunctiva - Bilateral-No scleral icterus.  Chest and Lung Exam Chest and lung exam reveals -quiet, even and easy respiratory effort with no use of accessory muscles and on auscultation, normal breath sounds, no adventitious sounds and normal vocal resonance. Inspection Chest Wall - Normal. Back - normal.  Cardiovascular Cardiovascular examination reveals -normal heart sounds, regular rate and rhythm with no murmurs and normal pedal pulses bilaterally.  Abdomen Inspection Skin - Scar - no surgical scars. Hernias - Inguinal hernia - Left - Reducible. Note: There is a very small, easily reducible left inguinal hernia. Right - Reducible. Note: There is a moderate-sized, mildly tender right inguinal hernia. Palpation/Percussion Palpation and Percussion of the abdomen reveal - Soft, Non Tender, No Rebound tenderness, No Rigidity (guarding) and No hepatosplenomegaly. Auscultation Auscultation of the abdomen reveals - Bowel sounds normal. Note: He has a well-healed midline incision that occurs just below the umbilicus   Neurologic Neurologic evaluation reveals -alert and oriented x 3 with no impairment of recent or remote memory. Mental Status-Normal.  Musculoskeletal Normal Exam - Left-Upper Extremity Strength Normal and Lower Extremity Strength Normal. Normal Exam - Right-Upper Extremity Strength Normal, Lower Extremity Weakness.    Assessment & Plan    BILATERAL INGUINAL HERNIA (K40.20)  Impression: I discussed the diagnosis with him in detail. He is well aware of hernias and  is already requesting a laparoscopic repair with mesh. I gave him literature regarding the surgery we discussed in detail. The biggest issue with the surgery laparoscopic would be his midline incision. Hopefully I'll be able to create a space laparoscopically. I discussed the risk of surgery with him. These risks include but are not limited to bleeding, infection, injury to surrounding structures, nerve entrapment, chronic pain, the need to convert to an open procedure, cardiopulmonary issues, postoperative recovery, etc. We will need to cardiac clearance for surgery and he will need to stop his Plavix for 5 days preoperatively.

## 2017-05-31 ENCOUNTER — Ambulatory Visit (HOSPITAL_COMMUNITY): Payer: BLUE CROSS/BLUE SHIELD | Admitting: Emergency Medicine

## 2017-05-31 ENCOUNTER — Ambulatory Visit (HOSPITAL_COMMUNITY)
Admission: RE | Admit: 2017-05-31 | Discharge: 2017-05-31 | Disposition: A | Discharge status: Home / Self Care | Payer: BLUE CROSS/BLUE SHIELD | Source: Ambulatory Visit | Attending: Surgery | Admitting: Surgery

## 2017-05-31 ENCOUNTER — Other Ambulatory Visit: Payer: Self-pay

## 2017-05-31 ENCOUNTER — Encounter (HOSPITAL_COMMUNITY): Payer: Self-pay | Admitting: Surgery

## 2017-05-31 ENCOUNTER — Ambulatory Visit (HOSPITAL_COMMUNITY): Payer: BLUE CROSS/BLUE SHIELD | Admitting: Anesthesiology

## 2017-05-31 ENCOUNTER — Encounter (HOSPITAL_COMMUNITY)
Admission: RE | Disposition: A | Discharge status: Home / Self Care | Payer: Self-pay | Source: Ambulatory Visit | Attending: Surgery

## 2017-05-31 DIAGNOSIS — I252 Old myocardial infarction: Secondary | ICD-10-CM | POA: Diagnosis not present

## 2017-05-31 DIAGNOSIS — E785 Hyperlipidemia, unspecified: Secondary | ICD-10-CM | POA: Diagnosis not present

## 2017-05-31 DIAGNOSIS — Z85038 Personal history of other malignant neoplasm of large intestine: Secondary | ICD-10-CM | POA: Diagnosis not present

## 2017-05-31 DIAGNOSIS — K4020 Bilateral inguinal hernia, without obstruction or gangrene, not specified as recurrent: Secondary | ICD-10-CM | POA: Insufficient documentation

## 2017-05-31 DIAGNOSIS — Z79899 Other long term (current) drug therapy: Secondary | ICD-10-CM | POA: Insufficient documentation

## 2017-05-31 DIAGNOSIS — G4730 Sleep apnea, unspecified: Secondary | ICD-10-CM | POA: Diagnosis not present

## 2017-05-31 DIAGNOSIS — K219 Gastro-esophageal reflux disease without esophagitis: Secondary | ICD-10-CM | POA: Diagnosis not present

## 2017-05-31 DIAGNOSIS — Z91013 Allergy to seafood: Secondary | ICD-10-CM | POA: Diagnosis not present

## 2017-05-31 DIAGNOSIS — Z9989 Dependence on other enabling machines and devices: Secondary | ICD-10-CM | POA: Diagnosis not present

## 2017-05-31 DIAGNOSIS — Z955 Presence of coronary angioplasty implant and graft: Secondary | ICD-10-CM | POA: Insufficient documentation

## 2017-05-31 DIAGNOSIS — Z7982 Long term (current) use of aspirin: Secondary | ICD-10-CM | POA: Diagnosis not present

## 2017-05-31 DIAGNOSIS — Z7902 Long term (current) use of antithrombotics/antiplatelets: Secondary | ICD-10-CM | POA: Diagnosis not present

## 2017-05-31 DIAGNOSIS — I2510 Atherosclerotic heart disease of native coronary artery without angina pectoris: Secondary | ICD-10-CM | POA: Insufficient documentation

## 2017-05-31 HISTORY — PX: INGUINAL HERNIA REPAIR: SHX194

## 2017-05-31 HISTORY — PX: INSERTION OF MESH: SHX5868

## 2017-05-31 LAB — PROTIME-INR
INR: 1
PROTHROMBIN TIME: 13.1 s (ref 11.4–15.2)

## 2017-05-31 SURGERY — REPAIR, HERNIA, INGUINAL, BILATERAL, LAPAROSCOPIC
Anesthesia: General | Site: Groin

## 2017-05-31 MED ORDER — LACTATED RINGERS IV SOLN
INTRAVENOUS | Status: DC
  Administered 2017-05-31: 08:00:00 via INTRAVENOUS

## 2017-05-31 MED ORDER — FENTANYL CITRATE (PF) 250 MCG/5ML IJ SOLN
INTRAMUSCULAR | Status: AC
Start: 2017-05-31 — End: ?
  Filled 2017-05-31: qty 5

## 2017-05-31 MED ORDER — BUPIVACAINE-EPINEPHRINE 0.5% -1:200000 IJ SOLN
INTRAMUSCULAR | Status: DC | PRN
  Administered 2017-05-31: 20 mL

## 2017-05-31 MED ORDER — SODIUM CHLORIDE 0.9 % IR SOLN
Status: DC | PRN
  Administered 2017-05-31: 1000 mL

## 2017-05-31 MED ORDER — LACTATED RINGERS IV SOLN
INTRAVENOUS | Status: DC | PRN
  Administered 2017-05-31: 09:00:00 via INTRAVENOUS

## 2017-05-31 MED ORDER — OXYCODONE HCL 5 MG PO TABS: 5.0000 mg | ORAL_TABLET | Freq: Four times a day (QID) | ORAL | 0 refills | Status: DC | PRN

## 2017-05-31 MED ORDER — OXYCODONE HCL 5 MG/5ML PO SOLN: 5.0000 mg | Freq: Once | ORAL | Status: DC | PRN

## 2017-05-31 MED ORDER — EPHEDRINE 5 MG/ML INJ
INTRAVENOUS | Status: AC
  Filled 2017-05-31: qty 10

## 2017-05-31 MED ORDER — SUGAMMADEX SODIUM 200 MG/2ML IV SOLN
INTRAVENOUS | Status: AC
  Filled 2017-05-31: qty 2

## 2017-05-31 MED ORDER — ROCURONIUM BROMIDE 10 MG/ML (PF) SYRINGE
PREFILLED_SYRINGE | INTRAVENOUS | Status: AC
  Filled 2017-05-31: qty 5

## 2017-05-31 MED ORDER — ACETAMINOPHEN 325 MG PO TABS: 650.0000 mg | ORAL_TABLET | ORAL | Status: DC | PRN

## 2017-05-31 MED ORDER — CEFAZOLIN SODIUM-DEXTROSE 2-4 GM/100ML-% IV SOLN
INTRAVENOUS | Status: AC
  Filled 2017-05-31: qty 100

## 2017-05-31 MED ORDER — PROMETHAZINE HCL 25 MG/ML IJ SOLN: 6.2500 mg | INTRAMUSCULAR | Status: DC | PRN

## 2017-05-31 MED ORDER — SUGAMMADEX SODIUM 200 MG/2ML IV SOLN
INTRAVENOUS | Status: DC | PRN
  Administered 2017-05-31: 200 mg via INTRAVENOUS

## 2017-05-31 MED ORDER — MORPHINE SULFATE (PF) 2 MG/ML IV SOLN: 1.0000 mg | INTRAVENOUS | Status: DC | PRN

## 2017-05-31 MED ORDER — ONDANSETRON HCL 4 MG/2ML IJ SOLN
INTRAMUSCULAR | Status: DC | PRN
  Administered 2017-05-31: 4 mg via INTRAVENOUS

## 2017-05-31 MED ORDER — ONDANSETRON HCL 4 MG/2ML IJ SOLN
INTRAMUSCULAR | Status: AC
  Filled 2017-05-31: qty 2

## 2017-05-31 MED ORDER — SODIUM CHLORIDE 0.9% FLUSH: 3.0000 mL | INTRAVENOUS | Status: DC | PRN

## 2017-05-31 MED ORDER — SODIUM CHLORIDE 0.9 % IV SOLN: 250.0000 mL | INTRAVENOUS | Status: DC | PRN

## 2017-05-31 MED ORDER — CHLORHEXIDINE GLUCONATE CLOTH 2 % EX PADS: 6.0000 | MEDICATED_PAD | Freq: Once | CUTANEOUS | Status: DC

## 2017-05-31 MED ORDER — FENTANYL CITRATE (PF) 100 MCG/2ML IJ SOLN
INTRAMUSCULAR | Status: DC | PRN
  Administered 2017-05-31: 100 ug via INTRAVENOUS

## 2017-05-31 MED ORDER — SODIUM CHLORIDE 0.9% FLUSH: 3.0000 mL | Freq: Two times a day (BID) | INTRAVENOUS | Status: DC

## 2017-05-31 MED ORDER — LIDOCAINE HCL (CARDIAC) 20 MG/ML IV SOLN
INTRAVENOUS | Status: DC | PRN
  Administered 2017-05-31: 120 mg via INTRAVENOUS

## 2017-05-31 MED ORDER — ACETAMINOPHEN 650 MG RE SUPP: 650.0000 mg | RECTAL | Status: DC | PRN

## 2017-05-31 MED ORDER — ROCURONIUM BROMIDE 100 MG/10ML IV SOLN
INTRAVENOUS | Status: DC | PRN
  Administered 2017-05-31: 10 mg via INTRAVENOUS
  Administered 2017-05-31: 40 mg via INTRAVENOUS

## 2017-05-31 MED ORDER — OXYCODONE HCL 5 MG PO TABS: 5.0000 mg | ORAL_TABLET | ORAL | Status: DC | PRN

## 2017-05-31 MED ORDER — HYDROMORPHONE HCL 1 MG/ML IJ SOLN: 0.2500 mg | INTRAMUSCULAR | Status: DC | PRN

## 2017-05-31 MED ORDER — CEFAZOLIN SODIUM-DEXTROSE 2-4 GM/100ML-% IV SOLN
2.0000 g | INTRAVENOUS | Status: AC
  Administered 2017-05-31: 2 g via INTRAVENOUS

## 2017-05-31 MED ORDER — CEFAZOLIN SODIUM-DEXTROSE 2-4 GM/100ML-% IV SOLN: 2.0000 g | INTRAVENOUS | Status: DC

## 2017-05-31 MED ORDER — PROPOFOL 10 MG/ML IV BOLUS
INTRAVENOUS | Status: AC
  Filled 2017-05-31: qty 20

## 2017-05-31 MED ORDER — EPHEDRINE SULFATE 50 MG/ML IJ SOLN
INTRAMUSCULAR | Status: DC | PRN
  Administered 2017-05-31: 10 mg via INTRAVENOUS

## 2017-05-31 MED ORDER — LIDOCAINE 2% (20 MG/ML) 5 ML SYRINGE
INTRAMUSCULAR | Status: AC
  Filled 2017-05-31: qty 5

## 2017-05-31 MED ORDER — MIDAZOLAM HCL 2 MG/2ML IJ SOLN
INTRAMUSCULAR | Status: AC
  Filled 2017-05-31: qty 2

## 2017-05-31 MED ORDER — OXYCODONE HCL 5 MG PO TABS: 5.0000 mg | ORAL_TABLET | Freq: Once | ORAL | Status: DC | PRN

## 2017-05-31 MED ORDER — PROPOFOL 10 MG/ML IV BOLUS
INTRAVENOUS | Status: DC | PRN
  Administered 2017-05-31: 200 mg via INTRAVENOUS

## 2017-05-31 MED ORDER — MIDAZOLAM HCL 5 MG/5ML IJ SOLN
INTRAMUSCULAR | Status: DC | PRN
  Administered 2017-05-31: 2 mg via INTRAVENOUS

## 2017-05-31 MED ORDER — 0.9 % SODIUM CHLORIDE (POUR BTL) OPTIME
TOPICAL | Status: DC | PRN
  Administered 2017-05-31: 1000 mL

## 2017-05-31 MED ORDER — BUPIVACAINE-EPINEPHRINE (PF) 0.5% -1:200000 IJ SOLN
INTRAMUSCULAR | Status: AC
  Filled 2017-05-31: qty 30

## 2017-05-31 SURGICAL SUPPLY — 37 items
ADH SKN CLS APL DERMABOND .7 (GAUZE/BANDAGES/DRESSINGS) ×2
APPLIER CLIP LOGIC TI 5 (MISCELLANEOUS) IMPLANT
APR CLP MED LRG 33X5 (MISCELLANEOUS)
BLADE CLIPPER SURG (BLADE) IMPLANT
CANISTER SUCT 3000ML PPV (MISCELLANEOUS) IMPLANT
CHLORAPREP W/TINT 26ML (MISCELLANEOUS) ×3 IMPLANT
COVER SURGICAL LIGHT HANDLE (MISCELLANEOUS) ×3 IMPLANT
DERMABOND ADVANCED (GAUZE/BANDAGES/DRESSINGS) ×1
DERMABOND ADVANCED .7 DNX12 (GAUZE/BANDAGES/DRESSINGS) ×2 IMPLANT
DEVICE SECURE STRAP 25 ABSORB (INSTRUMENTS) ×3 IMPLANT
DISSECT BALLN SPACEMKR + OVL (BALLOONS) ×3
DISSECTOR BALLN SPACEMKR + OVL (BALLOONS) ×2 IMPLANT
DISSECTOR BLUNT TIP ENDO 5MM (MISCELLANEOUS) IMPLANT
DRAPE LAPAROSCOPIC ABDOMINAL (DRAPES) ×3 IMPLANT
ELECT REM PT RETURN 9FT ADLT (ELECTROSURGICAL) ×3
ELECTRODE REM PT RTRN 9FT ADLT (ELECTROSURGICAL) ×2 IMPLANT
GLOVE SURG SIGNA 7.5 PF LTX (GLOVE) ×3 IMPLANT
GOWN STRL REUS W/ TWL LRG LVL3 (GOWN DISPOSABLE) ×4 IMPLANT
GOWN STRL REUS W/ TWL XL LVL3 (GOWN DISPOSABLE) ×2 IMPLANT
GOWN STRL REUS W/TWL LRG LVL3 (GOWN DISPOSABLE) ×6
GOWN STRL REUS W/TWL XL LVL3 (GOWN DISPOSABLE) ×3
KIT BASIN OR (CUSTOM PROCEDURE TRAY) ×3 IMPLANT
KIT ROOM TURNOVER OR (KITS) ×3 IMPLANT
MESH 3DMAX 4X6 LT LRG (Mesh General) ×3 IMPLANT
MESH 3DMAX 4X6 RT LRG (Mesh General) ×3 IMPLANT
NEEDLE INSUFFLATION 14GA 120MM (NEEDLE) IMPLANT
NS IRRIG 1000ML POUR BTL (IV SOLUTION) ×3 IMPLANT
PAD ARMBOARD 7.5X6 YLW CONV (MISCELLANEOUS) ×3 IMPLANT
SET IRRIG TUBING LAPAROSCOPIC (IRRIGATION / IRRIGATOR) IMPLANT
SET TROCAR LAP APPLE-HUNT 5MM (ENDOMECHANICALS) ×3 IMPLANT
SUT MNCRL AB 4-0 PS2 18 (SUTURE) ×3 IMPLANT
TOWEL OR 17X24 6PK STRL BLUE (TOWEL DISPOSABLE) ×3 IMPLANT
TOWEL OR 17X26 10 PK STRL BLUE (TOWEL DISPOSABLE) ×3 IMPLANT
TRAY FOLEY CATH SILVER 16FR (SET/KITS/TRAYS/PACK) ×3 IMPLANT
TRAY LAPAROSCOPIC MC (CUSTOM PROCEDURE TRAY) ×3 IMPLANT
TUBING INSUFFLATION (TUBING) ×3 IMPLANT
WATER STERILE IRR 1000ML POUR (IV SOLUTION) ×3 IMPLANT

## 2017-05-31 NOTE — Transfer of Care (Signed)
Immediate Anesthesia Transfer of Care Note  Patient: [name] [name]  Procedure(s) Performed: BILATERAL LAPAROSCOPIC INGUINAL HERNIA REPAIR WITH MESH (N/A Abdomen) INSERTION OF MESH (Bilateral Groin)  Patient Location: PACU  Anesthesia Type:General  Level of Consciousness: awake  Airway & Oxygen Therapy: Patient Spontanous Breathing  Post-op Assessment: Report given to RN and Post -op Vital signs reviewed and stable  Post vital signs: Reviewed and stable  Last Vitals:  Vitals:   05/31/17 0756  BP: 132/78  Pulse: (!) 53  Resp: 18  Temp: 36.7 C  SpO2: 99%    Last Pain:  Vitals:   05/31/17 0730  PainSc: 1       Patients Stated Pain Goal: 2 (43/73/57 8978)  Complications: No apparent anesthesia complications

## 2017-05-31 NOTE — Interval H&P Note (Signed)
History and Physical Interval Note:no change in H and P  05/31/2017 8:09 AM  [name]  has presented today for surgery, with the diagnosis of BILATERAL INGUINAL HERNIA  The various methods of treatment have been discussed with the patient and family. After consideration of risks, benefits and other options for treatment, the patient has consented to  Procedure(s): BILATERAL LAPAROSCOPIC INGUINAL HERNIA REPAIR WITH MESH (N/A) INSERTION OF MESH (N/A) as a surgical intervention .  The patient's history has been reviewed, patient examined, no change in status, stable for surgery.  I have reviewed the patient's chart and labs.  Questions were answered to the patient's satisfaction.     [name]

## 2017-05-31 NOTE — Anesthesia Procedure Notes (Signed)
Procedure Name: Intubation Date/Time: 05/31/2017 8:46 AM Performed by: Purvis Kilts, CRNA Pre-anesthesia Checklist: Patient identified, Emergency Drugs available, Suction available, Patient being monitored and Timeout performed Patient Re-evaluated:Patient Re-evaluated prior to induction Oxygen Delivery Method: Circle system utilized Preoxygenation: Pre-oxygenation with 100% oxygen Induction Type: IV induction Ventilation: Mask ventilation without difficulty Laryngoscope Size: Mac and 4 Grade View: Grade I Tube type: Oral Tube size: 7.5 mm Number of attempts: 1 Airway Equipment and Method: Stylet Placement Confirmation: ETT inserted through vocal cords under direct vision,  positive ETCO2 and breath sounds checked- equal and bilateral Secured at: 22 cm Tube secured with: Tape Dental Injury: Teeth and Oropharynx as per pre-operative assessment

## 2017-05-31 NOTE — Op Note (Signed)
BILATERAL LAPAROSCOPIC INGUINAL HERNIA REPAIR WITH MESH, INSERTION OF MESH  Procedure Note  MAVRIC CORTRIGHT 05/31/2017   Pre-op Diagnosis: BILATERAL INGUINAL HERNIA     Post-op Diagnosis: same  Procedure(s): BILATERAL LAPAROSCOPIC INGUINAL HERNIA REPAIR WITH MESH  INSERTION OF MESH (large Bard 3 D Max into the preperitoneal space)  Surgeon(s): Coralie Keens, MD  Anesthesia: General  Staff:  Circulator: Rosanne Sack, RN Scrub Person: Teschner, Mindy K, CST; [place], Kaitlyn M  Estimated Blood Loss: Minimal               Findings: The patient was found to have bilateral indirect inguinal hernias  Procedure: The patient was brought to the operating room and identified as the correct patient.  He was placed supine on the operating room table and general anesthesia was induced.  His abdomen was prepped and draped in the usual sterile fashion.  I made a small incision through her previous scar just below the umbilicus with a scalpel.  I took this down to the fascia which was opened just to the right of the midline.  I elevated the rectus muscle and then placed the dissecting balloon underneath the muscle and manipulate it toward the pubis.  The dissecting balloon was then insufflated under direct vision dissecting out the preperitoneal space.  I then removed the balloon and insufflation was begun with carbon dioxide.  I then placed two 5 mm trochars in the patient's lower midline both under direct vision.  I first dissected out the right inguinal area.  The patient had a indirect hernia sac which I was able to reduce from the cord structures.  I then dissected out the left inguinal area and the patient had a smaller indirect hernia sac which was able to reduce as well.  I then placed 2 separate pieces of Bard 3 DMax mesh into the preperitoneal space.  2 separate pieces of large Prolene mesh were used.  I placed each as an onlay on the inguinal floors.  I then tacked each to Cooper's  ligament, up the medial abdominal wall, and slightly laterally.  Good coverage of both cord structures and defects appear to be achieved.  Hemostasis also appeared to be achieved.       At this point, the trochars were removed under direct vision and the preperitoneal space was seen to collapse appropriately with the mesh lying in place.  I then closed the fascia at the incision near the umbilicus with a 2-0 Vicryl suture.  All incisions were then anesthetized with Marcaine.  Bilateral ilioinguinal nerve blocks were attempted with Marcaine as well.  The skin incisions were then closed with 4-0 Monocryl and Dermabond. The patient tolerated the procedure well.  All the counts were correct at the end of the procedure.  The patient was then extubated in the operating room and taken in a stable condition to the recovery room.  Saralee Bolick A   Date: 05/31/2017  Time: 9:35 AM

## 2017-05-31 NOTE — Anesthesia Preprocedure Evaluation (Addendum)
Anesthesia Evaluation  Patient identified by MRN, date of birth, ID band Patient awake    Reviewed: Allergy & Precautions, NPO status , Patient's Chart, lab work & pertinent test results, reviewed documented beta blocker date and time   Airway Mallampati: III  TM Distance: >3 FB Neck ROM: Full    Dental  (+) Chipped,    Pulmonary sleep apnea and Continuous Positive Airway Pressure Ventilation ,    Pulmonary exam normal breath sounds clear to auscultation       Cardiovascular hypertension, Pt. on medications and Pt. on home beta blockers + CAD, + Past MI and + Cardiac Stents  Normal cardiovascular exam Rhythm:Regular Rate:Normal  ECG: SB, rate 49  Cardiologist is [employer]. Cleared for surgery at last office visit 04/30/17 with Cecilie Kicks, NP   Neuro/Psych negative neurological ROS  negative psych ROS   GI/Hepatic Neg liver ROS, hiatal hernia, GERD  Medicated and Controlled,  Endo/Other  negative endocrine ROS  Renal/GU negative Renal ROS     Musculoskeletal negative musculoskeletal ROS (+)   Abdominal   Peds  Hematology HLD   Anesthesia Other Findings  BILATERAL INGUINAL HERNIA  Reproductive/Obstetrics                            Anesthesia Physical Anesthesia Plan  ASA: III  Anesthesia Plan: General   Post-op Pain Management:    Induction: Intravenous  PONV Risk Score and Plan: 2 and Ondansetron, Dexamethasone and Midazolam  Airway Management Planned: Oral ETT  Additional Equipment:   Intra-op Plan:   Post-operative Plan: Extubation in OR  Informed Consent: I have reviewed the patients History and Physical, chart, labs and discussed the procedure including the risks, benefits and alternatives for the proposed anesthesia with the patient or authorized representative who has indicated his/her understanding and acceptance.   Dental advisory given  Plan Discussed with:  CRNA  Anesthesia Plan Comments:        Anesthesia Quick Evaluation

## 2017-05-31 NOTE — Discharge Instructions (Signed)
[facility] _____[facility]  UMBILICAL OR INGUINAL HERNIA REPAIR: POST OP INSTRUCTIONS  Always review your discharge instruction sheet given to you by the facility where your surgery was performed. IF YOU HAVE DISABILITY OR FAMILY LEAVE FORMS, YOU MUST BRING THEM TO THE OFFICE FOR PROCESSING.   DO NOT GIVE THEM TO YOUR DOCTOR.  1. A  prescription for pain medication may be given to you upon discharge.  Take your pain medication as prescribed, if needed.  If narcotic pain medicine is not needed, then you may take acetaminophen (Tylenol) or ibuprofen (Advil) as needed. 2. Take your usually prescribed medications unless otherwise directed. If you need a refill on your pain medication, please contact your pharmacy.  They will contact our office to request authorization. Prescriptions will not be filled after 5 pm or on week-ends. 3. You should follow a light diet the first 24 hours after arrival home, such as soup and crackers, etc.  Be sure to include lots of fluids daily.  Resume your normal diet the day after surgery. 4.Most patients will experience some swelling and bruising around the umbilicus or in the groin and scrotum.  Ice packs and reclining will help.  Swelling and bruising can take several days to resolve.  6. It is common to experience some constipation if taking pain medication after surgery.  Increasing fluid intake and taking a stool softener (such as Colace) will usually help or prevent this problem from occurring.  A mild laxative (Milk of Magnesia or Miralax) should be taken according to package directions if there are no bowel movements after 48 hours. 7. Unless discharge instructions indicate otherwise, you may remove your bandages 24-48 hours after surgery, and you may shower at that time.  You may have steri-strips (small skin tapes) in place directly over the incision.  These strips should be left on the skin for 7-10 days.  If your surgeon used skin glue on the  incision, you may shower in 24 hours.  The glue will flake off over the next 2-3 weeks.  Any sutures or staples will be removed at the office during your follow-up visit. 8. ACTIVITIES:  You may resume regular (light) daily activities beginning the next day--such as daily self-care, walking, climbing stairs--gradually increasing activities as tolerated.  You may have sexual intercourse when it is comfortable.  Refrain from any heavy lifting or straining until approved by your doctor.  a.You may drive when you are no longer taking prescription pain medication, you can comfortably wear a seatbelt, and you can safely maneuver your car and apply brakes. b.RETURN TO WORK:  ABOUT 2 WEEKS _____________________________________________  9.You should see your doctor in the office for a follow-up appointment approximately 2-3 weeks after your surgery.  Make sure that you call for this appointment within a day or two after you arrive home to insure a convenient appointment time. 10.OTHER INSTRUCTIONS: _OK TO SHOWER STARTING TOMORROW RESUME PLAVIX Wednesday ICE PACK, TYLENOL, IBUPROFEN ALSO FOR PAIN NO LIFTING MORE THAN 15 POUNDS FOR 4 WEEKS________________________    _____________________________________  WHEN TO CALL YOUR DOCTOR: 1. Fever over 101.0 2. Inability to urinate 3. Nausea and/or vomiting 4. Extreme swelling or bruising 5. Continued bleeding from incision. 6. Increased pain, redness, or drainage from the incision  The clinic staff is available to answer your questions during regular business hours.  Please dont hesitate to call and ask to speak to one of the nurses for clinical concerns.  If you have a medical emergency,  go to the nearest emergency room or call 911.  A surgeon from [facility] is always on call at the hospital   [address] [place], [place], [place]  [address] ?  [address], [place], [place]   [address] [phone] ? [phone] ? [phone]  [phone] Web site: [email or web address]

## 2017-05-31 NOTE — Anesthesia Postprocedure Evaluation (Signed)
Anesthesia Post Note  Patient: [name] [name]  Procedure(s) Performed: BILATERAL LAPAROSCOPIC INGUINAL HERNIA REPAIR WITH MESH (N/A Abdomen) INSERTION OF MESH (Bilateral Groin)     Patient location during evaluation: PACU Anesthesia Type: General Level of consciousness: awake and alert Pain management: pain level controlled Vital Signs Assessment: post-procedure vital signs reviewed and stable Respiratory status: spontaneous breathing, nonlabored ventilation, respiratory function stable and patient connected to nasal cannula oxygen Cardiovascular status: blood pressure returned to baseline and stable Postop Assessment: no apparent nausea or vomiting Anesthetic complications: no    Last Vitals:  Vitals:   05/31/17 1015 05/31/17 1028  BP: (!) 143/85 (!) 156/95  Pulse: (!) 58 60  Resp: 16 18  Temp: (!) 36.1 C   SpO2: 100% 100%    Last Pain:  Vitals:   05/31/17 1028  PainSc: 0-No pain                 Ryan P Ellender

## 2017-06-01 ENCOUNTER — Encounter (HOSPITAL_COMMUNITY): Payer: Self-pay | Admitting: Surgery

## 2017-06-14 ENCOUNTER — Encounter (HOSPITAL_COMMUNITY): Payer: Self-pay | Admitting: Surgery

## 2017-07-22 ENCOUNTER — Telehealth: Payer: Self-pay | Admitting: Internal Medicine

## 2017-07-22 NOTE — Telephone Encounter (Signed)
Spoke with patient in regards to a possible recall on his Amlodipine (Norvasc)..  I informed him to call his pharmacy and check if his specific manufacturer falls into this recall..  Patient will follow up with [place] if this is the case.[name] [name]

## 2017-07-22 NOTE — Telephone Encounter (Signed)
New message    Pt c/o medication issue:  1. Name of Medication:  amLODipine (NORVASC) 5 MG tablet Take 5 mg by mouth 2 (two) times daily.      2. How are you currently taking this medication (dosage and times per day)?  2x daily  3. Are you having a reaction (difficulty breathing--STAT)? no  4. What is your medication issue?  Recall on medication , please call

## 2018-03-14 ENCOUNTER — Encounter: Payer: Self-pay | Admitting: Gastroenterology

## 2018-03-25 ENCOUNTER — Ambulatory Visit (INDEPENDENT_AMBULATORY_CARE_PROVIDER_SITE_OTHER): Payer: BLUE CROSS/BLUE SHIELD | Admitting: Cardiology

## 2018-03-25 ENCOUNTER — Encounter: Payer: Self-pay | Admitting: Cardiology

## 2018-03-25 VITALS — BP 118/68 | HR 56 | Ht 71.0 in | Wt 197.6 lb

## 2018-03-25 DIAGNOSIS — Z7189 Other specified counseling: Secondary | ICD-10-CM | POA: Diagnosis not present

## 2018-03-25 DIAGNOSIS — I2510 Atherosclerotic heart disease of native coronary artery without angina pectoris: Secondary | ICD-10-CM

## 2018-03-25 DIAGNOSIS — I10 Essential (primary) hypertension: Secondary | ICD-10-CM | POA: Diagnosis not present

## 2018-03-25 DIAGNOSIS — E782 Mixed hyperlipidemia: Secondary | ICD-10-CM | POA: Diagnosis not present

## 2018-03-25 NOTE — Progress Notes (Signed)
Cardiology Office Note:    Date:  03/25/2018   ID:  [name], DOB 1960/12/04, MRN [id number]  PCP:  Tobe Sos, MD  Cardiologist:  Buford Dresser, MD PhD (previously Dr. Saunders Revel)  Referring MD: Tobe Sos, MD   CC: Follow up  History of Present Illness:    [name] [name] is a 57 y.o. male with a hx of CAD s/p remote PCI of PL in 2014 and prior stent of D2, hypertension, hyperlipidemia, OSA on CPAP who is seen in follow up (new to me, previously with Dr. Saunders Revel) for the evaluation and management of CAD. He was last seen by Dr. Saunders Revel on 10/02/16.  Overall he is doing well. No chest pain, shortness of breath, PND, orthopnea, syncope, LE edema, and palpitations. He does still have pain in his back and joints from prolonged driving. He is here today with his wife and grandchildren. He is tolerating all of his medications without difficulty.  He is working on his lifestyle, and we discussed recommendations regarding diet and exercise for secondary prevention.   Past Medical History:  Diagnosis Date  . ADENOCARCINOMA, SIGMOID COLON    a. s/p resection/chemo  . Allergy    SEASONAL  . Anginal pain ([place])   . BARRETT'S ESOPHAGUS, HX OF   . CAD    a. 2007: DES to LAD and Diag;  b. 08/2011 non-ischemic myoview, EF 60%;  c. 08/2012 NSTEMI/Cath/PCI: LM n, LAD patent stent, D1 small, 80, D2 90 ISR (PTCA), LCX nl, RCA 20d, RPL 95 (2.25x12 Promus Premier DES), EF 55-65%.  . Diverticulosis   . DYSLIPIDEMIA   . GERD (gastroesophageal reflux disease)   . HIATAL HERNIA WITH REFLUX   . Hyperplastic colon polyp   . HYPERTENSION   . Myocardial infarction ([place])   . 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 s/p wedge resection  . SLEEP APNEA   . Sleep apnea     Past Surgical History:  Procedure Laterality Date  . COLON RESECTION  1998  . COLON SURGERY    . CORONARY STENT PLACEMENT  2007   taxus  . INGUINAL HERNIA REPAIR N/A 05/31/2017   Procedure: BILATERAL  LAPAROSCOPIC INGUINAL HERNIA REPAIR WITH MESH;  Surgeon: Coralie Keens, MD;  Location: [facility];  Service: General;  Laterality: N/A;  . INSERTION OF MESH Bilateral 05/31/2017   Procedure: INSERTION OF MESH;  Surgeon: Coralie Keens, MD;  Location: [facility];  Service: General;  Laterality: Bilateral;  . KNEE SURGERY    . LEFT HEART CATHETERIZATION WITH CORONARY ANGIOGRAM N/A 08/22/2012   Procedure: LEFT HEART CATHETERIZATION WITH CORONARY ANGIOGRAM;  Surgeon: Peter M Martinique, MD;  Location[facility] C[facility] LAB;  Service: Cardiovascular;  Laterality: N/A;  . LEFT HEART CATHETERIZATION WITH CORONARY ANGIOGRAM N/A 09/12/2013   Procedure: LEFT HEART CATHETERIZATION WITH CORONARY ANGIOGRAM;  Surgeon: Troy Sine, MD;  Location: [facility] CATH LAB;  Service: Cardiovascular;  Laterality: N/A;  . LUNG REMOVAL, PARTIAL     LEFT  . TONSILLECTOMY      Current Medications: Current Outpatient Medications on File Prior to Visit  Medication Sig  . amLODipine (NORVASC) 5 MG tablet Take 5 mg by mouth 2 (two) times daily.   Marland Kitchen aspirin EC 81 MG tablet Take 1 tablet (81 mg total) by mouth daily.  . Cholecalciferol (VITAMIN D3) 2000 UNITS capsule Take 2,000 Units by mouth daily.   . clopidogrel (PLAVIX) 75 MG tablet Take 1 tablet (75 mg total) by mouth daily.  Marland Kitchen  cyanocobalamin (,VITAMIN B-12,) 1000 MCG/ML injection Inject 1,000 mcg into the muscle every 30 (thirty) days.   Marland Kitchen DEXILANT 60 MG capsule Take 60 mg by mouth 2 (two) times daily.   . enalapril (VASOTEC) 10 MG tablet Take 10 mg by mouth 2 (two) times daily.  Marland Kitchen ezetimibe (ZETIA) 10 MG tablet Take 10 mg by mouth daily.  . fexofenadine (ALLEGRA) 180 MG tablet Take 180 mg by mouth daily as needed for allergies or rhinitis.  Marland Kitchen ibuprofen (ADVIL,MOTRIN) 800 MG tablet Take 800 mg by mouth every 8 (eight) hours as needed for headache or moderate pain.  Marland Kitchen loratadine (CLARITIN) 10 MG tablet Take 10 mg by mouth daily as needed for allergies.   . metoprolol succinate (TOPROL-XL) 25  MG 24 hr tablet Take 25 mg by mouth daily.  . montelukast (SINGULAIR) 10 MG tablet Take 10 mg by mouth daily as needed (for allergies).  . Multiple Vitamins-Minerals (MULTIVITAMIN PO) Take 1 tablet by mouth daily.  Marland Kitchen omega-3 acid ethyl esters (LOVAZA) 1 G capsule Take 2 g by mouth 2 (two) times daily.   Marland Kitchen oxyCODONE (OXY IR/ROXICODONE) 5 MG immediate release tablet Take 1-2 tablets (5-10 mg total) by mouth every 6 (six) hours as needed for moderate pain, severe pain or breakthrough pain.  . rosuvastatin (CRESTOR) 20 MG tablet Take 10 mg by mouth daily.    No current facility-administered medications on file prior to visit.      Allergies:   Shellfish-derived products   Social History   Socioeconomic History  . Marital status: Married    Spouse name: Not on file  . Number of children: Not on file  . Years of education: Not on file  . Highest education level: Not on file  Occupational History  . Not on file  Social Needs  . Financial resource strain: Not on file  . Food insecurity:    Worry: Not on file    Inability: Not on file  . Transportation needs:    Medical: Not on file    Non-medical: Not on file  Tobacco Use  . Smoking status: Never Smoker  . Smokeless tobacco: Never Used  Substance and Sexual Activity  . Alcohol use: No  . Drug use: No  . Sexual activity: Yes  Lifestyle  . Physical activity:    Days per week: Not on file    Minutes per session: Not on file  . Stress: Not on file  Relationships  . Social connections:    Talks on phone: Not on file    Gets together: Not on file    Attends religious service: Not on file    Active member of club or organization: Not on file    Attends meetings of clubs or organizations: Not on file    Relationship status: Not on file  Other Topics Concern  . Not on file  Social History Narrative   Lives in [place], [place] with his wife and son.  Works for [employer] in [employer].     Family History: The patient's family history  includes Colon polyps in his mother; Heart attack in his paternal uncle and paternal uncle; Heart disease in his father and mother. There is no history of Colon cancer.  ROS:   Please see the history of present illness.  Additional pertinent ROS:  Constitutional: Negative for chills, fever, night sweats, unintentional weight loss  HENT: Negative for ear pain and hearing loss.   Eyes: Negative for loss of vision and eye pain.  Respiratory: Negative for cough, sputum, shortness of breath, wheezing.   Cardiovascular: Negative for chest pain, palpitations , PND, orthopnea, lower extremity edema and claudication.  Gastrointestinal: Negative for abdominal pain, melena, and hematochezia.  Genitourinary: Negative for dysuria and hematuria.  Musculoskeletal: Negative for falls and myalgias.  Skin: Negative for itching and rash.  Neurological: Negative for focal weakness, focal sensory changes and loss of consciousness.  Endo/Heme/Allergies: Does not bruise/bleed easily.    EKGs/Labs/Other Studies Reviewed:    The following studies were reviewed today: Cath/PCI:  LHC (09/12/13): LMCA normal. Normal LAD with patent mid vessel stent. D2 with 20-30% stenosis proximal to patent stent. LCx without significant disease. RCA with 20-30% disease. 20% ostial PDA stenosis. Patent stent inPL branch.  LHC/PCI (08/22/12): LMCA normal. LAD with patent mid vessel stent. Small first diagonal branch with 80% ostial stenosis. Large second diagonal branch with 90% in-stent restenosis. LCx normal. Large, dominant RCA with mild luminal irregularities in the mid and distal vessel. PL branch has a 95% focal stenosis. LVEF 55-65%. Successful PTCA to D2 stent withAngioSculpt2.5 x 10 mm balloon. Successful PCI to PL branch with a Promus Premier 2.25 x 12 mm drug-eluting stent.  CV Surgery:  None  EP Procedures and Devices:  None  Non-Invasive Evaluation(s):  Exercise myocardial perfusion stress test (09/14/11):  Good exercise capacity (9 minutes,0seconds). No significant ST-T changes. Occasional PVCs noted. Normal myocardial perfusion without evidence of ischemia or scar. LVEF 60%.  EKG:  EKG is personally reviewed.  The ekg ordered previously demonstrates sinus bradycardia.  Recent Labs: 05/27/2017: BUN 14; Creatinine, Ser 0.79; Hemoglobin 14.8; Platelets 230; Potassium 4.3; Sodium 139  Recent Lipid Panel    Component Value Date/Time   CHOL 125 12/20/2015 1448   TRIG 142 12/20/2015 1448   HDL 32 (L) 12/20/2015 1448   CHOLHDL 3.9 12/20/2015 1448   VLDL 28 12/20/2015 1448   LDLCALC 65 12/20/2015 1448    Physical Exam:    VS:  BP 118/68   Pulse (!) 56   Ht 5\' 11"  (1.803 m)   Wt 197 lb 9.6 oz (89.6 kg)   BMI 27.56 kg/m     Wt Readings from Last 3 Encounters:  03/25/18 197 lb 9.6 oz (89.6 kg)  05/27/17 197 lb 9.6 oz (89.6 kg)  04/30/17 196 lb (88.9 kg)     GEN: Well nourished, well developed in no acute distress HEENT: Normal NECK: No JVD; No carotid bruits LYMPHATICS: No lymphadenopathy CARDIAC: regular rhythm, normal S1 and S2, no murmurs, rubs, gallops. Radial and DP pulses 2+ bilaterally. RESPIRATORY:  Clear to auscultation without rales, wheezing or rhonchi  ABDOMEN: Soft, non-tender, non-distended MUSCULOSKELETAL:  No edema; No deformity  SKIN: Warm and dry NEUROLOGIC:  Alert and oriented x 3 PSYCHIATRIC:  Normal affect   ASSESSMENT:    1. Atherosclerosis of native coronary artery of native heart without angina pectoris   2. Hypertension, essential   3. Mixed hyperlipidemia   4. Counseling on health promotion and disease prevention    PLAN:    1. CAD s/p prior remote PCI, no current angina:  -continue aspirin 81 mg, clopidogrel 75 mg. He was told to be on clopidogrel indefinitely -continue metoprolol succinate 25 mg  2. Hypertension: at goal. Continue amlodipine 5 mg, enalapril 10 mg BID, metoprolol succinate 25 mg  3. Hyperlipidemia: LDL goal <70. He has labs  drawn from his PCP, and he will send these to use. Prior LDL was 65.  -continue rosuvastatin 10 mg, ezetimibe, lovaza  4. Secondary prevention: -recommend heart healthy/Mediterranean diet, with whole grains, fruits, vegetable, fish, lean meats, nuts, and olive oil. Limit salt. -recommend moderate walking, 3-5 times/week for 30-50 minutes each session. Aim for at least 150 minutes.week. Goal should be pace of 3 miles/hours, or walking 1.5 miles in 30 minutes -recommend avoidance of tobacco products. Avoid excess alcohol. -Additional risk factor control:  -Diabetes: A1c is not available  -Lipids: will have them sent from PCP  -Blood pressure control: at goal  -Weight: BMI 27  -Sleep apnea: on CPAP  Plan for follow up: 6 mos  Medication Adjustments/Labs and Tests Ordered: Current medicines are reviewed at length with the patient today.  Concerns regarding medicines are outlined above.  No orders of the defined types were placed in this encounter.  No orders of the defined types were placed in this encounter.   Patient Instructions  Medication Instructions:  Your Physician recommend you continue on your current medication as directed.    If you need a refill on your cardiac medications before your next appointment, please call your pharmacy.   Lab work: None  Testing/Procedures: None  Follow-Up: At [facility], you and your health needs are our priority.  As part of our continuing mission to provide you with exceptional heart care, we have created designated Provider Care Teams.  These Care Teams include your primary Cardiologist (physician) and Advanced Practice Providers (APPs -  Physician Assistants and Nurse Practitioners) who all work together to provide you with the care you need, when you need it. You will need a follow up appointment in 6 months.  Please call our office 2 months in advance to schedule this appointment.  You may see Dr. Harrell Gave or one of the following  Advanced Practice Providers on your designated Care Team:   Rosaria Ferries, PA-C . Jory Sims, DNP, ANP  Any Other Special Instructions Will Be Listed Below (If Applicable).       Signed, Buford Dresser, MD PhD 03/25/2018 9:52 AM    [place]

## 2018-03-25 NOTE — Patient Instructions (Signed)

## 2018-03-28 ENCOUNTER — Encounter: Payer: Self-pay | Admitting: Cardiology

## 2018-03-31 ENCOUNTER — Telehealth: Payer: Self-pay | Admitting: Cardiology

## 2018-03-31 NOTE — Telephone Encounter (Signed)
   Primary Cardiologist: Buford Dresser, MD  Chart reviewed as part of pre-operative protocol coverage. Given past medical history and time since last visit, based on ACC/AHA guidelines, HRIDAY STAI would be at acceptable risk for the planned procedure without further cardiovascular testing.  Was seen last week by Dr. Harrell Gave and was stable.    Per Dr. Harrell Gave pt may hold plavix for 5 days prior to colonoscopy and resume as soon as possible afterwards.   I will route this recommendation to the requesting party via Epic fax function and remove from pre-op pool.  Please call with questions.  Cecilie Kicks, NP 03/31/2018, 4:19 PM

## 2018-03-31 NOTE — Telephone Encounter (Signed)
Dr. Harrell Gave can pt hold his plavix for 5 days prior to colonoscopy?   Otherwise from your note he was stable.  Please sent answer to pre0op pool thanks.

## 2018-03-31 NOTE — Telephone Encounter (Signed)
   [facility] Pre-operative Risk Assessment    Request for surgical clearance:  1. What type of surgery is being performed? EGD, colonscopy for Barrett's without dysplasia, h/o colon cancer  2. When is this surgery scheduled? TBD   3. What type of clearance is required (medical clearance vs. Pharmacy clearance to hold med vs. Both)? Both   4. Are there any medications that need to be held prior to surgery and how long? Plavix (aspirin?)   5. Practice name and name of physician performing surgery? [facility]   6. What is your office phone number [phone] (for [facility])    7.   What is your office fax number [phone] (attn: triage nurse)  8.   Anesthesia type (None, local, MAC, general) ? Not specified    Fidel Levy 03/31/2018, 1:08 PM  _________________________________________________________________   (provider comments below)

## 2018-03-31 NOTE — Telephone Encounter (Signed)
Attempted to reach requesting office to see if they received fax. No answer,. Lvm instructing them to call back and let us know if they have received it.

## 2018-03-31 NOTE — Telephone Encounter (Signed)
[name] [name] is ok to hold plavix for 5 days prior to colonoscopy. Restart as per GI based on whether biopsies are taken. Thank you.

## 2018-04-01 NOTE — Telephone Encounter (Signed)
Called [facility] to confirm if clearance letter recvd'. Left message to call if not recv'd. I will remove from the pool. LMOM x 2.

## 2018-06-09 NOTE — Unmapped External Note (Signed)
See Provation note in Procedures tab and full report with images in Media tab labeled Operative Procedure Reports

## 2018-08-30 ENCOUNTER — Other Ambulatory Visit: Payer: Self-pay

## 2018-08-30 ENCOUNTER — Observation Stay (HOSPITAL_COMMUNITY)
Admission: EM | Admit: 2018-08-30 | Discharge: 2018-08-31 | Disposition: A | Discharge status: Home / Self Care | Payer: BLUE CROSS/BLUE SHIELD | Attending: Internal Medicine | Admitting: Internal Medicine

## 2018-08-30 ENCOUNTER — Encounter (HOSPITAL_COMMUNITY): Payer: Self-pay | Admitting: Emergency Medicine

## 2018-08-30 ENCOUNTER — Emergency Department (HOSPITAL_COMMUNITY): Payer: BLUE CROSS/BLUE SHIELD

## 2018-08-30 DIAGNOSIS — Z8249 Family history of ischemic heart disease and other diseases of the circulatory system: Secondary | ICD-10-CM | POA: Diagnosis not present

## 2018-08-30 DIAGNOSIS — I700 Atherosclerosis of aorta: Secondary | ICD-10-CM | POA: Diagnosis not present

## 2018-08-30 DIAGNOSIS — K219 Gastro-esophageal reflux disease without esophagitis: Secondary | ICD-10-CM | POA: Diagnosis present

## 2018-08-30 DIAGNOSIS — Z955 Presence of coronary angioplasty implant and graft: Secondary | ICD-10-CM | POA: Insufficient documentation

## 2018-08-30 DIAGNOSIS — Z7982 Long term (current) use of aspirin: Secondary | ICD-10-CM | POA: Diagnosis not present

## 2018-08-30 DIAGNOSIS — R079 Chest pain, unspecified: Principal | ICD-10-CM | POA: Diagnosis present

## 2018-08-30 DIAGNOSIS — Z85038 Personal history of other malignant neoplasm of large intestine: Secondary | ICD-10-CM | POA: Insufficient documentation

## 2018-08-30 DIAGNOSIS — I10 Essential (primary) hypertension: Secondary | ICD-10-CM | POA: Diagnosis present

## 2018-08-30 DIAGNOSIS — K2270 Barrett's esophagus without dysplasia: Secondary | ICD-10-CM | POA: Insufficient documentation

## 2018-08-30 DIAGNOSIS — R0789 Other chest pain: Secondary | ICD-10-CM | POA: Diagnosis not present

## 2018-08-30 DIAGNOSIS — C187 Malignant neoplasm of sigmoid colon: Secondary | ICD-10-CM | POA: Diagnosis present

## 2018-08-30 DIAGNOSIS — Z79899 Other long term (current) drug therapy: Secondary | ICD-10-CM | POA: Diagnosis not present

## 2018-08-30 DIAGNOSIS — E785 Hyperlipidemia, unspecified: Secondary | ICD-10-CM | POA: Diagnosis not present

## 2018-08-30 DIAGNOSIS — I252 Old myocardial infarction: Secondary | ICD-10-CM | POA: Diagnosis not present

## 2018-08-30 DIAGNOSIS — G4733 Obstructive sleep apnea (adult) (pediatric): Secondary | ICD-10-CM | POA: Diagnosis not present

## 2018-08-30 DIAGNOSIS — I25110 Atherosclerotic heart disease of native coronary artery with unstable angina pectoris: Secondary | ICD-10-CM | POA: Diagnosis not present

## 2018-08-30 DIAGNOSIS — I2510 Atherosclerotic heart disease of native coronary artery without angina pectoris: Secondary | ICD-10-CM | POA: Diagnosis present

## 2018-08-30 DIAGNOSIS — Z7902 Long term (current) use of antithrombotics/antiplatelets: Secondary | ICD-10-CM | POA: Diagnosis not present

## 2018-08-30 LAB — CBC WITH DIFFERENTIAL/PLATELET
Abs Immature Granulocytes: 0.02 10*3/uL (ref 0.00–0.07)
Basophils Absolute: 0 10*3/uL (ref 0.0–0.1)
Basophils Relative: 0 %
Eosinophils Absolute: 0.1 10*3/uL (ref 0.0–0.5)
Eosinophils Relative: 1 %
HCT: 45.7 % (ref 39.0–52.0)
Hemoglobin: 14.9 g/dL (ref 13.0–17.0)
Immature Granulocytes: 0 %
Lymphocytes Relative: 19 %
Lymphs Abs: 1.7 10*3/uL (ref 0.7–4.0)
MCH: 29.3 pg (ref 26.0–34.0)
MCHC: 32.6 g/dL (ref 30.0–36.0)
MCV: 90 fL (ref 80.0–100.0)
Monocytes Absolute: 0.7 10*3/uL (ref 0.1–1.0)
Monocytes Relative: 8 %
Neutro Abs: 6.4 10*3/uL (ref 1.7–7.7)
Neutrophils Relative %: 72 %
Platelets: 233 10*3/uL (ref 150–400)
RBC: 5.08 MIL/uL (ref 4.22–5.81)
RDW: 12.4 % (ref 11.5–15.5)
WBC: 8.9 10*3/uL (ref 4.0–10.5)
nRBC: 0 % (ref 0.0–0.2)

## 2018-08-30 LAB — BASIC METABOLIC PANEL
Anion gap: 12 (ref 5–15)
BUN: 27 mg/dL — ABNORMAL HIGH (ref 6–20)
CO2: 22 mmol/L (ref 22–32)
Calcium: 9.8 mg/dL (ref 8.9–10.3)
Chloride: 105 mmol/L (ref 98–111)
Creatinine, Ser: 0.93 mg/dL (ref 0.61–1.24)
GFR calc Af Amer: >60 mL/min (ref >60)
GFR calc non Af Amer: >60 mL/min (ref >60)
Glucose, Bld: 114 mg/dL — ABNORMAL HIGH (ref 70–99)
Potassium: 3.6 mmol/L (ref 3.5–5.1)
Sodium: 139 mmol/L (ref 135–145)

## 2018-08-30 LAB — HEPARIN LEVEL (UNFRACTIONATED): Heparin Unfractionated: 0.34 IU/mL (ref 0.30–0.70)

## 2018-08-30 LAB — TROPONIN I
Troponin I: <0.03 ng/mL (ref <0.03)
Troponin I: <0.03 ng/mL
Troponin I: <0.03 ng/mL

## 2018-08-30 MED ORDER — LORATADINE 10 MG PO TABS: 10.0000 mg | ORAL_TABLET | Freq: Every day | ORAL | Status: DC | PRN

## 2018-08-30 MED ORDER — CLOPIDOGREL BISULFATE 75 MG PO TABS
75.0000 mg | ORAL_TABLET | Freq: Every day | ORAL | Status: DC
  Administered 2018-08-31: 09:00:00 75 mg via ORAL
  Filled 2018-08-30: qty 1

## 2018-08-30 MED ORDER — CYANOCOBALAMIN 1000 MCG/ML IJ SOLN: 1000.0000 ug | INTRAMUSCULAR | Status: DC

## 2018-08-30 MED ORDER — VITAMIN D 25 MCG (1000 UNIT) PO TABS
2000.0000 [IU] | ORAL_TABLET | Freq: Every day | ORAL | Status: DC
  Administered 2018-08-31: 09:00:00 2000 [IU] via ORAL
  Filled 2018-08-30: qty 2

## 2018-08-30 MED ORDER — HEPARIN (PORCINE) 25000 UT/250ML-% IV SOLN
1100.0000 [IU]/h | INTRAVENOUS | Status: DC
  Administered 2018-08-30 – 2018-08-31 (×2): 1100 [IU]/h via INTRAVENOUS
  Filled 2018-08-30 (×2): qty 250

## 2018-08-30 MED ORDER — ASPIRIN EC 81 MG PO TBEC
81.0000 mg | DELAYED_RELEASE_TABLET | Freq: Every day | ORAL | Status: DC
  Administered 2018-08-31: 09:00:00 81 mg via ORAL
  Filled 2018-08-30: qty 1

## 2018-08-30 MED ORDER — ENALAPRIL MALEATE 20 MG PO TABS
10.0000 mg | ORAL_TABLET | Freq: Two times a day (BID) | ORAL | Status: DC
  Administered 2018-08-30 – 2018-08-31 (×2): 10 mg via ORAL
  Filled 2018-08-30 (×2): qty 1

## 2018-08-30 MED ORDER — ROSUVASTATIN CALCIUM 5 MG PO TABS
10.0000 mg | ORAL_TABLET | Freq: Every day | ORAL | Status: DC
  Administered 2018-08-30: 22:00:00 10 mg via ORAL
  Filled 2018-08-30 (×2): qty 2

## 2018-08-30 MED ORDER — AMLODIPINE BESYLATE 5 MG PO TABS
5.0000 mg | ORAL_TABLET | Freq: Two times a day (BID) | ORAL | Status: DC
  Administered 2018-08-30 – 2018-08-31 (×2): 5 mg via ORAL
  Filled 2018-08-30 (×2): qty 1

## 2018-08-30 MED ORDER — OMEGA-3-ACID ETHYL ESTERS 1 G PO CAPS
2.0000 g | ORAL_CAPSULE | Freq: Two times a day (BID) | ORAL | Status: DC
  Administered 2018-08-30 – 2018-08-31 (×2): 2 g via ORAL
  Filled 2018-08-30 (×2): qty 2

## 2018-08-30 MED ORDER — ONDANSETRON HCL 4 MG/2ML IJ SOLN: 4.0000 mg | Freq: Four times a day (QID) | INTRAMUSCULAR | Status: DC | PRN

## 2018-08-30 MED ORDER — METOPROLOL SUCCINATE ER 25 MG PO TB24: 25.0000 mg | ORAL_TABLET | Freq: Every evening | ORAL | Status: DC

## 2018-08-30 MED ORDER — SODIUM CHLORIDE 0.45 % IV SOLN
INTRAVENOUS | Status: DC
  Administered 2018-08-30 – 2018-08-31 (×2): via INTRAVENOUS

## 2018-08-30 MED ORDER — MONTELUKAST SODIUM 10 MG PO TABS: 10.0000 mg | ORAL_TABLET | Freq: Every day | ORAL | Status: DC | PRN

## 2018-08-30 MED ORDER — LORATADINE 10 MG PO TABS
10.0000 mg | ORAL_TABLET | Freq: Every day | ORAL | Status: DC
  Filled 2018-08-30: qty 1

## 2018-08-30 MED ORDER — HEPARIN BOLUS VIA INFUSION
4000.0000 [IU] | Freq: Once | INTRAVENOUS | Status: AC
  Administered 2018-08-30: 14:00:00 4000 [IU] via INTRAVENOUS
  Filled 2018-08-30: qty 4000

## 2018-08-30 MED ORDER — ACETAMINOPHEN 325 MG PO TABS: 650.0000 mg | ORAL_TABLET | ORAL | Status: DC | PRN

## 2018-08-30 MED ORDER — PANTOPRAZOLE SODIUM 40 MG PO TBEC
40.0000 mg | DELAYED_RELEASE_TABLET | Freq: Every day | ORAL | Status: DC
  Administered 2018-08-31: 09:00:00 40 mg via ORAL
  Filled 2018-08-30: qty 1

## 2018-08-30 MED ORDER — IBUPROFEN 800 MG PO TABS: 800.0000 mg | ORAL_TABLET | Freq: Three times a day (TID) | ORAL | Status: DC | PRN

## 2018-08-30 MED ORDER — ADULT MULTIVITAMIN LIQUID CH
Freq: Every day | ORAL | Status: DC
  Administered 2018-08-31: 15 mL via ORAL
  Filled 2018-08-30: qty 15

## 2018-08-30 MED ORDER — EZETIMIBE 10 MG PO TABS
10.0000 mg | ORAL_TABLET | Freq: Every day | ORAL | Status: DC
  Administered 2018-08-30: 22:00:00 10 mg via ORAL
  Filled 2018-08-30 (×2): qty 1

## 2018-08-30 MED ORDER — ALPRAZOLAM 0.25 MG PO TABS: 0.2500 mg | ORAL_TABLET | Freq: Two times a day (BID) | ORAL | Status: DC | PRN

## 2018-08-30 NOTE — Progress Notes (Signed)
ANTICOAGULATION CONSULT NOTE - Initial Consult  Pharmacy Consult for heparin Indication: chest pain/ACS  Allergies  Allergen Reactions  . Shellfish-Derived Products Anaphylaxis, Swelling and Other (See Comments)    Large amounts of crab    Patient Measurements: Height: 6' (182.9 cm) Weight: 192 lb (87.1 kg) IBW/kg (Calculated) : 77.6 Heparin Dosing Weight: 87.1kg  Vital Signs: Temp: 97.8 F (36.6 C) (04/14 1148) Temp Source: Oral (04/14 1148) BP: 142/77 (04/14 1330) Pulse Rate: 61 (04/14 1330)  Labs: Recent Labs    08/30/18 1155  HGB 14.9  HCT 45.7  PLT 233  CREATININE 0.93  TROPONINI <0.03    Estimated Creatinine Clearance: 96.2 mL/min (by C-G formula based on SCr of 0.93 mg/dL).   Medical History: Past Medical History:  Diagnosis Date  . ADENOCARCINOMA, SIGMOID COLON    a. s/p resection/chemo  . Allergy    SEASONAL  . Anginal pain ([place])   . BARRETT'S ESOPHAGUS, HX OF   . CAD    a. 2007: DES to LAD and Diag;  b. 08/2011 non-ischemic myoview, EF 60%;  c. 08/2012 NSTEMI/Cath/PCI: LM n, LAD patent stent, D1 small, 80, D2 90 ISR (PTCA), LCX nl, RCA 20d, RPL 95 (2.25x12 Promus Premier DES), EF 55-65%.  . Diverticulosis   . DYSLIPIDEMIA   . GERD (gastroesophageal reflux disease)   . HIATAL HERNIA WITH REFLUX   . Hyperplastic colon polyp   . HYPERTENSION   . Myocardial infarction ([place])   . 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 s/p wedge resection  . SLEEP APNEA   . Sleep apnea     Medications:  Infusions:  . heparin      Assessment: 86 yom presented to the ED with CP. To start IV heparin for anticoagulation. Baseline CBC is WNL and he is not on anticoagulation PTA.   Goal of Therapy:  Heparin level 0.3-0.7 units/ml Monitor platelets by anticoagulation protocol: Yes   Plan:  Heparin bolus 4000 units IV x 1 Heparin gtt 1100 units/hr Check a 6 hr heparin level Daily heparin level and CBC  [name] [name], Rande Lawman 08/30/2018,1:54 PM

## 2018-08-30 NOTE — Progress Notes (Signed)
[place] for heparin Indication: chest pain/ACS  Allergies  Allergen Reactions  . Shellfish-Derived Products Anaphylaxis, Swelling and Other (See Comments)    Large amounts of crab    Patient Measurements: Height: 5\' 11"  (180.3 cm) Weight: 196 lb 8 oz (89.1 kg) IBW/kg (Calculated) : 75.3 Heparin Dosing Weight: 87.1kg  Vital Signs: Temp: 97.5 F (36.4 C) (04/14 2055) Temp Source: Oral (04/14 2055) BP: 130/78 (04/14 2055) Pulse Rate: 52 (04/14 2055)  Labs: Recent Labs    08/30/18 1155 08/30/18 1532 08/30/18 2031  HGB 14.9  --   --   HCT 45.7  --   --   PLT 233  --   --   HEPARINUNFRC  --   --  0.34  CREATININE 0.93  --   --   TROPONINI <0.03 <0.03  --     Estimated Creatinine Clearance: 93.3 mL/min (by C-G formula based on SCr of 0.93 mg/dL).   Medical History: Past Medical History:  Diagnosis Date  . ADENOCARCINOMA, SIGMOID COLON    a. s/p resection/chemo  . Allergy    SEASONAL  . Anginal pain ([address])   . BARRETT'S ESOPHAGUS, HX OF   . CAD    a. 2007: DES to LAD and Diag;  b. 08/2011 non-ischemic myoview, EF 60%;  c. 08/2012 NSTEMI/Cath/PCI: LM n, LAD patent stent, D1 small, 80, D2 90 ISR (PTCA), LCX nl, RCA 20d, RPL 95 (2.25x12 Promus Premier DES), EF 55-65%.  . Diverticulosis   . DYSLIPIDEMIA   . GERD (gastroesophageal reflux disease)   . HIATAL HERNIA WITH REFLUX   . Hyperplastic colon polyp   . HYPERTENSION   . Myocardial infarction ([place])   . 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 s/p wedge resection  . SLEEP APNEA   . Sleep apnea     Medications:  Infusions:  . sodium chloride 75 mL/hr at 08/30/18 1452  . heparin 1,100 Units/hr (08/30/18 1403)    Assessment: 71 yom presented to the ED with CP. To start IV heparin for anticoagulation. Baseline CBC is WNL and he is not on anticoagulation PTA.   Initial heparin level at goal on 1100 units/hr. No bleeding issues noted. Continue at current rate.    Goal of Therapy:  Heparin level 0.3-0.7 units/ml Monitor platelets by anticoagulation protocol: Yes   Plan:  Heparin gtt 1100 units/hr Daily heparin level and CBC  Erin Hearing PharmD., BCPS Clinical Pharmacist 08/30/2018 9:36 PM

## 2018-08-30 NOTE — ED Triage Notes (Signed)
Pt in from home with c/o chest pressure b/t shoulder blades - started yesterday. Also states he feels rapid HR, 88 reg in triage. Cardiac hx with stent placements x 2. Was doing strenuous log work yesterday as well

## 2018-08-30 NOTE — ED Notes (Addendum)
ED TO INPATIENT HANDOFF REPORT  ED Nurse Name and Phone #: Kathlee Nations [phone]  S Name/Age/Gender [name] [name] 58 y.o. male Room/Bed: 033C/033C  Code Status   Code Status: Prior  Home/SNF/Other Home Patient oriented to: self, place, time and situation Is this baseline? Yes   Triage Complete: Triage complete  Chief Complaint fast heart rate  Triage Note Pt in from home with c/o chest pressure b/t shoulder blades - started yesterday. Also states he feels rapid HR, 88 reg in triage. Cardiac hx with stent placements x 2. Was doing strenuous log work yesterday as well  Pt states he was cutting a tree down yesterday 4/13 @1600  his heart began to race in the 130-140s. Pt finished cutting down tree. His heart rate did come down to 80-90s but patient states that is high for him. His normal rate is 50s He is having pain in the shoulder blade and has cardiac history.   Allergies Allergies  Allergen Reactions  . Shellfish-Derived Products Anaphylaxis, Swelling and Other (See Comments)    Large amounts of crab    Level of Care/Admitting Diagnosis ED Disposition    ED Disposition Condition Comment   Admit  Hospital Area: [facility] [[id number]]  Level of Care: Telemetry Cardiac [103]  I expect the patient will be discharged within 24 hours: Yes  LOW acuity---Tx typically complete <24 hrs---ACUTE conditions typically can be evaluated <24 hours---LABS likely to return to acceptable levels <24 hours---IS near functional baseline---EXPECTED to return to current living arrangement---NOT newly hypoxic: Meets criteria for 5C-Observation unit  Diagnosis: Chest pain [[id number]]  Admitting Physician: Elwyn Reach [[id number]]  Attending Physician: Elwyn Reach [[id number]]  Possible Covid Disease Patient Isolation: N/A  PT Class (Do Not Modify): Observation [104]  PT Acc Code (Do Not Modify): Observation [10022]       B Medical/Surgery History Past Medical History:  Diagnosis  Date  . ADENOCARCINOMA, SIGMOID COLON    a. s/p resection/chemo  . Allergy    SEASONAL  . Anginal pain ([place])   . BARRETT'S ESOPHAGUS, HX OF   . CAD    a. 2007: DES to LAD and Diag;  b. 08/2011 non-ischemic myoview, EF 60%;  c. 08/2012 NSTEMI/Cath/PCI: LM n, LAD patent stent, D1 small, 80, D2 90 ISR (PTCA), LCX nl, RCA 20d, RPL 95 (2.25x12 Promus Premier DES), EF 55-65%.  . Diverticulosis   . DYSLIPIDEMIA   . GERD (gastroesophageal reflux disease)   . HIATAL HERNIA WITH REFLUX   . Hyperplastic colon polyp   . HYPERTENSION   . Myocardial infarction ([place])   . 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 s/p wedge resection  . SLEEP APNEA   . Sleep apnea    Past Surgical History:  Procedure Laterality Date  . COLON RESECTION  1998  . COLON SURGERY    . CORONARY STENT PLACEMENT  2007   taxus  . INGUINAL HERNIA REPAIR N/A 05/31/2017   Procedure: BILATERAL LAPAROSCOPIC INGUINAL HERNIA REPAIR WITH MESH;  Surgeon: Coralie Keens, MD;  Location: [facility];  Service: General;  Laterality: N/A;  . INSERTION OF MESH Bilateral 05/31/2017   Procedure: INSERTION OF MESH;  Surgeon: Coralie Keens, MD;  Location: [facility];  Service: General;  Laterality: Bilateral;  . KNEE SURGERY    . LEFT HEART CATHETERIZATION WITH CORONARY ANGIOGRAM N/A 08/22/2012   Procedure: LEFT HEART CATHETERIZATION WITH CORONARY ANGIOGRAM;  Surgeon: Peter M Martinique, MD;  Location[facility];  Service: Cardiovascular;  Laterality: N/A;  . LEFT HEART CATHETERIZATION WITH CORONARY ANGIOGRAM N/A 09/12/2013   Procedure: LEFT HEART CATHETERIZATION WITH CORONARY ANGIOGRAM;  Surgeon: Troy Sine, MD;  Location: [facility] CATH LAB;  Service: Cardiovascular;  Laterality: N/A;  . LUNG REMOVAL, PARTIAL     LEFT  . TONSILLECTOMY       A IV Location/Drains/Wounds Patient Lines/Drains/Airways Status   Active Line/Drains/Airways    Name:   Placement date:   Placement time:   Site:   Days:   Peripheral IV 08/30/18 Left Arm   08/30/18     1156    Arm   less than 1   Incision (Closed) 05/31/17 Abdomen Other (Comment)   05/31/17    0924     456   Incision - 3 Ports Abdomen 1: Umbilicus 2: Mid;Lower 3: Lower   05/31/17    0929     456          Intake/Output Last 24 hours No intake or output data in the 24 hours ending 08/30/18 1350  Labs/Imaging Results for orders placed or performed during the hospital encounter of 08/30/18 (from the past 48 hour(s))  Basic metabolic panel     Status: Abnormal   Collection Time: 08/30/18 11:55 AM  Result Value Ref Range   Sodium 139 135 - 145 mmol/L   Potassium 3.6 3.5 - 5.1 mmol/L   Chloride 105 98 - 111 mmol/L   CO2 22 22 - 32 mmol/L   Glucose, Bld 114 (H) 70 - 99 mg/dL   BUN 27 (H) 6 - 20 mg/dL   Creatinine, Ser 0.93 0.61 - 1.24 mg/dL   Calcium 9.8 8.9 - 10.3 mg/dL   GFR calc non Af Amer >60 >60 mL/min   GFR calc Af Amer >60 >60 mL/min   Anion gap 12 5 - 15    Comment: Performed at [facility], [place] [address], [place], [place] [address]  Troponin I - Once     Status: None   Collection Time: 08/30/18 11:55 AM  Result Value Ref Range   Troponin I <0.03 <0.03 ng/mL    Comment: Performed at [facility] [address] [place], [place] [address]  CBC with Differential     Status: None   Collection Time: 08/30/18 11:55 AM  Result Value Ref Range   WBC 8.9 4.0 - 10.5 K/uL   RBC 5.08 4.22 - 5.81 MIL/uL   Hemoglobin 14.9 13.0 - 17.0 g/dL   HCT 45.7 39.0 - 52.0 %   MCV 90.0 80.0 - 100.0 fL   MCH 29.3 26.0 - 34.0 pg   MCHC 32.6 30.0 - 36.0 g/dL   RDW 12.4 11.5 - 15.5 %   Platelets 233 150 - 400 K/uL   nRBC 0.0 0.0 - 0.2 %   Neutrophils Relative % 72 %   Neutro Abs 6.4 1.7 - 7.7 K/uL   Lymphocytes Relative 19 %   Lymphs Abs 1.7 0.7 - 4.0 K/uL   Monocytes Relative 8 %   Monocytes Absolute 0.7 0.1 - 1.0 K/uL   Eosinophils Relative 1 %   Eosinophils Absolute 0.1 0.0 - 0.5 K/uL   Basophils Relative 0 %   Basophils Absolute 0.0 0.0 - 0.1 K/uL   Immature  Granulocytes 0 %   Abs Immature Granulocytes 0.02 0.00 - 0.07 K/uL    Comment: Performed at [facility], [address], [place], [place] [address]   Dg Chest Port 1 View  Result  Date: 08/30/2018 CLINICAL DATA:  Pain and tachycardia EXAM: PORTABLE CHEST 1 VIEW COMPARISON:  March 16, 2016 FINDINGS: No edema or consolidation. Heart size and pulmonary vascularity are normal. Stents are noted pulmonary arteries. There is aortic atherosclerosis. No adenopathy. No bone lesions. No pneumothorax. IMPRESSION: No edema or consolidation. Stable cardiac silhouette. Aortic Atherosclerosis (ICD10-I70.0). Electronically Signed   By: Lowella Grip III M.D.   On: 08/30/2018 12:01    Pending Labs [employer] (From admission, onward)    Start     Ordered   Signed and Held  HIV antibody (Routine Testing)  Once,   R     Signed and Held   Signed and Held  Troponin I - Now Then Q6H  Now then every 6 hours,   R     Signed and Held          Vitals/Pain Today's Vitals   08/30/18 1256 08/30/18 1300 08/30/18 1315 08/30/18 1330  BP:  129/68 127/77 (!) 142/77  Pulse:  61 66 61  Resp:  16 19 15   Temp:      TempSrc:      SpO2:  99% 100% 100%  Weight:      Height:      PainSc: 0-No pain  0-No pain     Isolation Precautions No active isolations  Medications Medications - No data to display  Mobility walks Low fall risk   Focused Assessments Cardiac Assessment Handoff:  Cardiac Rhythm: Normal sinus rhythm Lab Results  Component Value Date   TROPONINI <0.03 08/30/2018   No results found for: DDIMER Does the Patient currently have chest pain? No     R Recommendations: See Admitting Provider Note  Report given to: [facility]  Additional Notes:

## 2018-08-30 NOTE — H&P (Signed)
History and Physical   [name] [name] XVQ:[id number] DOB: 12-27-1960 DOA: 08/30/2018  Referring MD/NP/PA: Dr. Sedonia Small  PCP: Tobe Sos, MD   Outpatient Specialists: Cardiologist at [facility] and locally [place]  Patient coming from: Home  Chief Complaint: Chest pain and rapid heart rate  HPI: [name] [name] is a 58 y.o. male with medical history significant of coronary artery disease status post multiple stents x2 last event was 5 years ago, colon cancer status post resection and chemo, seasonal allergies, Barrett's esophagus, hyperlipidemia, GERD, hypertension, obstructive sleep apnea who presents to the ER with pain mainly in his back between his shoulder blades.  Patient has had this type of pain in the past and whenever he had it he has ended up getting acute coronary syndrome with stent placed.  His last episode was about 5 years ago.  Pain is rated as 4 out of 10 more dull and achy.  Denied any diaphoresis.  Denied any radiation.  Symptoms first appeared while he was sitting watching TV.  It gradually subsided over couple of hours but then came back again repeatedly.  He decided therefore to come to the emergency room for treatment.  He denied any ongoing chest pain..  ED Course: Temperature is 98, blood pressure 150/92 pulse 58 respiratory 22 oxygen sat 98% room air.CBC and Chemistry within normal. CXR within normal, EKG is unchanged.  Review of Systems: As per HPI otherwise 10 point review of systems negative.    Past Medical History:  Diagnosis Date  . ADENOCARCINOMA, SIGMOID COLON    a. s/p resection/chemo  . Allergy    SEASONAL  . Anginal pain ([place])   . BARRETT'S ESOPHAGUS, HX OF   . CAD    a. 2007: DES to LAD and Diag;  b. 08/2011 non-ischemic myoview, EF 60%;  c. 08/2012 NSTEMI/Cath/PCI: LM n, LAD patent stent, D1 small, 80, D2 90 ISR (PTCA), LCX nl, RCA 20d, RPL 95 (2.25x12 Promus Premier DES), EF 55-65%.  . Diverticulosis   . DYSLIPIDEMIA   . GERD  (gastroesophageal reflux disease)   . HIATAL HERNIA WITH REFLUX   . Hyperplastic colon polyp   . HYPERTENSION   . Myocardial infarction ([place])   . 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 s/p wedge resection  . SLEEP APNEA   . Sleep apnea     Past Surgical History:  Procedure Laterality Date  . COLON RESECTION  1998  . COLON SURGERY    . CORONARY STENT PLACEMENT  2007   taxus  . INGUINAL HERNIA REPAIR N/A 05/31/2017   Procedure: BILATERAL LAPAROSCOPIC INGUINAL HERNIA REPAIR WITH MESH;  Surgeon: Coralie Keens, MD;  Location: [facility];  Service: General;  Laterality: N/A;  . INSERTION OF MESH Bilateral 05/31/2017   Procedure: INSERTION OF MESH;  Surgeon: Coralie Keens, MD;  Location: [facility];  Service: General;  Laterality: Bilateral;  . KNEE SURGERY    . LEFT HEART CATHETERIZATION WITH CORONARY ANGIOGRAM N/A 08/22/2012   Procedure: LEFT HEART CATHETERIZATION WITH CORONARY ANGIOGRAM;  Surgeon: Peter M Martinique, MD;  Location: [facility] CATH LAB;  Service: Cardiovascular;  Laterality: N/A;  . LEFT HEART CATHETERIZATION WITH CORONARY ANGIOGRAM N/A 09/12/2013   Procedure: LEFT HEART CATHETERIZATION WITH CORONARY ANGIOGRAM;  Surgeon: Troy Sine, MD;  Location: [facility] CATH LAB;  Service: Cardiovascular;  Laterality: N/A;  . LUNG REMOVAL, PARTIAL     LEFT  . TONSILLECTOMY       reports that he has never smoked.  He has never used smokeless tobacco. He reports that he does not drink alcohol or use drugs.  Allergies  Allergen Reactions  . Shellfish-Derived Products Anaphylaxis, Swelling and Other (See Comments)    Large amounts of crab    Family History  Problem Relation Age of Onset  . Heart disease Mother        alive @ 23.  s/p cabg  . Colon polyps Mother   . Heart disease Father        alive @ 55.  s/p stenting  . Heart attack Paternal Uncle        @ 94  . Heart attack Paternal Uncle        @ 18  . Colon cancer Neg Hx      Prior to Admission medications   Medication Sig  Start Date End Date Taking? Authorizing Provider  amLODipine (NORVASC) 5 MG tablet Take 5 mg by mouth 2 (two) times daily.    Yes [provider]  aspirin EC 81 MG tablet Take 1 tablet (81 mg total) by mouth daily. 04/30/17  Yes Isaiah Serge, NP  Cholecalciferol (VITAMIN D3) 2000 UNITS capsule Take 2,000 Units by mouth daily.  01/29/12  Yes [provider]  clopidogrel (PLAVIX) 75 MG tablet Take 1 tablet (75 mg total) by mouth daily. 08/23/12  Yes Theora Gianotti, NP  cyanocobalamin (,VITAMIN B-12,) 1000 MCG/ML injection Inject 1,000 mcg into the muscle every 30 (thirty) days.  11/23/14  Yes [provider]  DEXILANT 60 MG capsule Take 60 mg by mouth daily.  07/19/11  Yes [provider]  enalapril (VASOTEC) 10 MG tablet Take 10 mg by mouth 2 (two) times daily.   Yes [provider]  ezetimibe (ZETIA) 10 MG tablet Take 10 mg by mouth daily.   Yes [provider]  fexofenadine (ALLEGRA) 180 MG tablet Take 180 mg by mouth daily as needed for allergies or rhinitis.   Yes [provider]  ibuprofen (ADVIL,MOTRIN) 800 MG tablet Take 800 mg by mouth every 8 (eight) hours as needed for headache or moderate pain.   Yes [provider]  loratadine (CLARITIN) 10 MG tablet Take 10 mg by mouth daily as needed for allergies.  01/29/12  Yes [provider]  metoprolol succinate (TOPROL-XL) 25 MG 24 hr tablet Take 25 mg by mouth every evening.    Yes [provider]  montelukast (SINGULAIR) 10 MG tablet Take 10 mg by mouth daily as needed (for allergies).   Yes [provider]  Multiple Vitamins-Minerals (MULTIVITAMIN PO) Take 1 tablet by mouth daily.   Yes [provider]  omega-3 acid ethyl esters (LOVAZA) 1 G capsule Take 2 g by mouth 2 (two) times daily.    Yes [provider]  rosuvastatin (CRESTOR) 20 MG tablet Take 10 mg by mouth daily.    Yes [provider]  oxyCODONE (OXY  IR/ROXICODONE) 5 MG immediate release tablet Take 1-2 tablets (5-10 mg total) by mouth every 6 (six) hours as needed for moderate pain, severe pain or breakthrough pain. Patient not taking: Reported on 08/30/2018 05/31/17   Coralie Keens, MD    Physical Exam: Vitals:   08/30/18 1315 08/30/18 1330 08/30/18 1405 08/30/18 1435  BP: 127/77 (!) 142/77 129/76 135/80  Pulse: 66 61 62   Resp: 19 15 (!) 22 20  Temp:    98.1 F (36.7 C)  TempSrc:    Oral  SpO2: 100% 100% 100% 100%  Weight:  89.1 kg  Height:    5\' 11"  (1.803 m)      Constitutional: NAD, calm, comfortable, anxious Vitals:   08/30/18 1315 08/30/18 1330 08/30/18 1405 08/30/18 1435  BP: 127/77 (!) 142/77 129/76 135/80  Pulse: 66 61 62   Resp: 19 15 (!) 22 20  Temp:    98.1 F (36.7 C)  TempSrc:    Oral  SpO2: 100% 100% 100% 100%  Weight:    89.1 kg  Height:    5\' 11"  (1.803 m)   Eyes: PERRL, lids and conjunctivae normal ENMT: Mucous membranes are moist. Posterior pharynx clear of any exudate or lesions.Normal dentition.  Neck: normal, supple, no masses, no thyromegaly Respiratory: clear to auscultation bilaterally, no wheezing, no crackles. Normal respiratory effort. No accessory muscle use.  Cardiovascular: Regular rate and rhythm, no murmurs / rubs / gallops. No extremity edema. 2+ pedal pulses. No carotid bruits.  Abdomen: no tenderness, no masses palpated. No hepatosplenomegaly. Bowel sounds positive.  Musculoskeletal: no clubbing / cyanosis. No joint deformity upper and lower extremities. Good ROM, no contractures. Normal muscle tone.  Skin: no rashes, lesions, ulcers. No induration Neurologic: CN 2-12 grossly intact. Sensation intact, DTR normal. Strength 5/5 in all 4.  Psychiatric: Normal judgment and insight. Alert and oriented x 3. Normal mood.     Labs on Admission: I have personally reviewed following labs and imaging studies  CBC: Recent Labs  Lab 08/30/18 1155  WBC 8.9  NEUTROABS 6.4  HGB 14.9   HCT 45.7  MCV 90.0  PLT 540   Basic Metabolic Panel: Recent Labs  Lab 08/30/18 1155  NA 139  K 3.6  CL 105  CO2 22  GLUCOSE 114*  BUN 27*  CREATININE 0.93  CALCIUM 9.8   GFR: Estimated Creatinine Clearance: 93.3 mL/min (by C-G formula based on SCr of 0.93 mg/dL). Liver Function Tests: No results for input(s): AST, ALT, ALKPHOS, BILITOT, PROT, ALBUMIN in the last 168 hours. No results for input(s): LIPASE, AMYLASE in the last 168 hours. No results for input(s): AMMONIA in the last 168 hours. Coagulation Profile: No results for input(s): INR, PROTIME in the last 168 hours. Cardiac Enzymes: Recent Labs  Lab 08/30/18 1155 08/30/18 1532  TROPONINI <0.03 <0.03   BNP (last 3 results) No results for input(s): PROBNP in the last 8760 hours. HbA1C: No results for input(s): HGBA1C in the last 72 hours. CBG: No results for input(s): GLUCAP in the last 168 hours. Lipid Profile: No results for input(s): CHOL, HDL, LDLCALC, TRIG, CHOLHDL, LDLDIRECT in the last 72 hours. Thyroid Function Tests: No results for input(s): TSH, T4TOTAL, FREET4, T3FREE, THYROIDAB in the last 72 hours. Anemia Panel: No results for input(s): VITAMINB12, FOLATE, FERRITIN, TIBC, IRON, RETICCTPCT in the last 72 hours. Urine analysis: No results found for: COLORURINE, APPEARANCEUR, LABSPEC, PHURINE, GLUCOSEU, HGBUR, BILIRUBINUR, KETONESUR, PROTEINUR, UROBILINOGEN, NITRITE, LEUKOCYTESUR Sepsis Labs: @LABRCNTIP (procalcitonin:4,lacticidven:4) )No results found for this or any previous visit (from the past 240 hour(s)).   Radiological Exams on Admission: Dg Chest Port 1 View  Result Date: 08/30/2018 CLINICAL DATA:  Pain and tachycardia EXAM: PORTABLE CHEST 1 VIEW COMPARISON:  March 16, 2016 FINDINGS: No edema or consolidation. Heart size and pulmonary vascularity are normal. Stents are noted pulmonary arteries. There is aortic atherosclerosis. No adenopathy. No bone lesions. No pneumothorax. IMPRESSION: No  edema or consolidation. Stable cardiac silhouette. Aortic Atherosclerosis (ICD10-I70.0). Electronically Signed   By: Lowella Grip III M.D.   On: 08/30/2018 12:01    EKG: Independently reviewed.  It shows normal Sinus Rhythm with a rate of 74.  No significant ST changes  Assessment/Plan Principal Problem:   Chest pain Active Problems:   ADENOCARCINOMA, SIGMOID COLON   Hypertension, essential   Coronary atherosclerosis of native coronary artery   GERD   HLD (hyperlipidemia)     #1. Chest pain: In the setting of known CAD.  Patient has no ongoing chest pain but recurrent pain between his shoulder blades which is the symptoms he has had in the past.  Admit the patient and run cardiac enzymes x3.  Cardiac consult in the morning for disposition.  He might benefit from stress test.  Patient however said his previous stress test did not pick up his disease and he is gone ahead to have a heart attack but 2 months later.  Will defer decision to cardiology.  Start on heparin drip with his beta-blockers and ACE inhibitors.  Baby aspirin also.  #2 hypertension: Blood pressures well controlled.  Continue with home regimen.  #3 coronary artery disease: Again patient is known to have had 2 stents in the past.  Continue with telemetry monitoring and above.  #4 GERD: Continue with PPIs  #5 hyperlipidemia: Continue with statin   DVT prophylaxis: Heparin drip Code Status: Full code Family Communication: Discussed with patient and his wife Disposition Plan: To be determined Consults called: Call cardiology consult in the morning Admission status: Observation  Severity of Illness: The appropriate patient status for this patient is OBSERVATION. Observation status is judged to be reasonable and necessary in order to provide the required intensity of service to ensure the patient's safety. The patient's presenting symptoms, physical exam findings, and initial radiographic and laboratory data in the  context of their medical condition is felt to place them at decreased risk for further clinical deterioration. Furthermore, it is anticipated that the patient will be medically stable for discharge from the hospital within 2 midnights of admission. The following factors support the patient status of observation.   " The patient's presenting symptoms include Chest pain. " The physical exam findings include no obvious findings. " The initial radiographic and laboratory data are negative EKG and enzymes.     Barbette Merino MD [facility] Pager [phone]  If 7PM-7AM, please contact night-coverage [email or web address] Password [facility]  08/30/2018, 6:03 PM

## 2018-08-30 NOTE — ED Triage Notes (Signed)
Pt states he was cutting a tree down yesterday 4/13 @1600  his heart began to race in the 130-140s. Pt finished cutting down tree. His heart rate did come down to 80-90s but patient states that is high for him. His normal rate is 50s He is having pain in the shoulder blade and has cardiac history.

## 2018-08-30 NOTE — ED Notes (Signed)
Got patient undress on the monitor did ekg shown to Dr Lewis Shock patient is resting with call bell in reach

## 2018-08-30 NOTE — ED Provider Notes (Signed)
[facility] EMERGENCY DEPARTMENT Provider Note   CSN: [id number] Arrival date & time: 08/30/18  1136    History   Chief Complaint Chief Complaint  Patient presents with  . Chest Pain  . rapid HR    HPI [name] [name] is a 58 y.o. male presenting today for chest pain and palpitations that began yesterday.  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  Patient states that the pain gradually subsided over the course of a few hours however he notes that whenever he exerts himself the pain and feeling of heart racing returns.  Patient reports that he was working on an [employer] today and had another episode of heart racing and pain between the shoulder blades.  Patient reports that he took his atenolol with some relief.  On arrival he reports 1/10 mild sharp ache between his shoulder blades without aggravating or alleviating factors and he denies feeling of heart racing at this time.  Of note patient with history of CAD and stent placement x2.  Patient reports he had similar pain of a sharp throb between his shoulder blades with his previous NSTEMI.  Review of patient's medical records reveals last catheterization was in April 2015.     HPI  Past Medical History:  Diagnosis Date  . ADENOCARCINOMA, SIGMOID COLON    a. s/p resection/chemo  . Allergy    SEASONAL  . Anginal pain ([place])   . BARRETT'S ESOPHAGUS, HX OF   . CAD    a. 2007: DES to LAD and Diag;  b. 08/2011 non-ischemic myoview, EF 60%;  c. 08/2012 NSTEMI/Cath/PCI: LM n, LAD patent stent, D1 small, 80, D2 90 ISR (PTCA), LCX nl, RCA 20d, RPL 95 (2.25x12 Promus Premier DES), EF 55-65%.  . Diverticulosis   . DYSLIPIDEMIA   . GERD (gastroesophageal reflux disease)   . HIATAL HERNIA WITH REFLUX   . Hyperplastic colon polyp   . HYPERTENSION   . Myocardial infarction ([place])   . 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 s/p wedge resection  . SLEEP APNEA   . Sleep apnea     Patient Active Problem List   Diagnosis Date Noted  . Chest pain 08/30/2018  . HTN (hypertension) 09/12/2013  . CAD (coronary artery disease) 09/12/2013  . Back pain 09/12/2013  . HLD (hyperlipidemia) 09/12/2013  . NSTEMI (non-ST elevated myocardial infarction) ([place]) 08/23/2012  . GERD 06/28/2009  . RECTAL BLEEDING 06/28/2009  . PERSONAL HX COLON CANCER 06/28/2009  . BARRETT'S ESOPHAGUS, HX OF 06/27/2009  . SECONDARY MALIGNANT NEOPLASM OF LUNG 07/21/2007  . DYSLIPIDEMIA 07/21/2007  . Hypertension, essential 07/21/2007  . Coronary atherosclerosis of native coronary artery 07/21/2007  . SLEEP APNEA 07/21/2007  . HIATAL HERNIA WITH REFLUX 03/28/2007  . ADENOCARCINOMA, SIGMOID COLON 12/22/1996    Past Surgical History:  Procedure Laterality Date  . COLON RESECTION  1998  . COLON SURGERY    . CORONARY STENT PLACEMENT  2007   taxus  . INGUINAL HERNIA REPAIR N/A 05/31/2017   Procedure: BILATERAL LAPAROSCOPIC INGUINAL HERNIA REPAIR WITH MESH;  Surgeon: Coralie Keens, MD;  Location: [facility];  Service: General;  Laterality: N/A;  . INSERTION OF MESH Bilateral 05/31/2017   Procedure: INSERTION OF MESH;  Surgeon: Coralie Keens, MD;  Location: [facility];  Service: General;  Laterality: Bilateral;  . KNEE SURGERY    .  LEFT HEART CATHETERIZATION WITH CORONARY ANGIOGRAM N/A 08/22/2012   Procedure: LEFT HEART CATHETERIZATION WITH CORONARY ANGIOGRAM;  Surgeon: Peter M Martinique, MD;  Location: [facility] CATH LAB;  Service: Cardiovascular;  Laterality: N/A;  . LEFT HEART CATHETERIZATION WITH CORONARY ANGIOGRAM N/A 09/12/2013   Procedure: LEFT HEART CATHETERIZATION WITH CORONARY ANGIOGRAM;  Surgeon: Troy Sine, MD;  Location: [facility] CATH LAB;  Service: Cardiovascular;  Laterality: N/A;  . LUNG REMOVAL, PARTIAL     LEFT  . TONSILLECTOMY          Home Medications    Prior to Admission medications   Medication Sig  Start Date End Date Taking? Authorizing Provider  amLODipine (NORVASC) 5 MG tablet Take 5 mg by mouth 2 (two) times daily.    Yes [provider]  aspirin EC 81 MG tablet Take 1 tablet (81 mg total) by mouth daily. 04/30/17  Yes Isaiah Serge, NP  Cholecalciferol (VITAMIN D3) 2000 UNITS capsule Take 2,000 Units by mouth daily.  01/29/12  Yes [provider]  clopidogrel (PLAVIX) 75 MG tablet Take 1 tablet (75 mg total) by mouth daily. 08/23/12  Yes Theora Gianotti, NP  cyanocobalamin (,VITAMIN B-12,) 1000 MCG/ML injection Inject 1,000 mcg into the muscle every 30 (thirty) days.  11/23/14  Yes [provider]  DEXILANT 60 MG capsule Take 60 mg by mouth daily.  07/19/11  Yes [provider]  enalapril (VASOTEC) 10 MG tablet Take 10 mg by mouth 2 (two) times daily.   Yes [provider]  ezetimibe (ZETIA) 10 MG tablet Take 10 mg by mouth daily.   Yes [provider]  fexofenadine (ALLEGRA) 180 MG tablet Take 180 mg by mouth daily as needed for allergies or rhinitis.   Yes [provider]  ibuprofen (ADVIL,MOTRIN) 800 MG tablet Take 800 mg by mouth every 8 (eight) hours as needed for headache or moderate pain.   Yes [provider]  loratadine (CLARITIN) 10 MG tablet Take 10 mg by mouth daily as needed for allergies.  01/29/12  Yes [provider]  metoprolol succinate (TOPROL-XL) 25 MG 24 hr tablet Take 25 mg by mouth every evening.    Yes [provider]  montelukast (SINGULAIR) 10 MG tablet Take 10 mg by mouth daily as needed (for allergies).   Yes [provider]  Multiple Vitamins-Minerals (MULTIVITAMIN PO) Take 1 tablet by mouth daily.   Yes [provider]  omega-3 acid ethyl esters (LOVAZA) 1 G capsule Take 2 g by mouth 2 (two) times daily.    Yes [provider]  rosuvastatin (CRESTOR) 20 MG tablet Take 10 mg by mouth daily.    Yes [provider]  oxyCODONE (OXY  IR/ROXICODONE) 5 MG immediate release tablet Take 1-2 tablets (5-10 mg total) by mouth every 6 (six) hours as needed for moderate pain, severe pain or breakthrough pain. Patient not taking: Reported on 08/30/2018 05/31/17   Coralie Keens, MD    Family History Family History  Problem Relation Age of Onset  . Heart disease Mother        alive @ 92.  s/p cabg  . Colon polyps Mother   . Heart disease Father        alive @ 50.  s/p stenting  . Heart attack Paternal Uncle        @ 13  . Heart attack Paternal Uncle        @ 43  . Colon cancer Neg Hx  Social History Social History   Tobacco Use  . Smoking status: Never Smoker  . Smokeless tobacco: Never Used  Substance Use Topics  . Alcohol use: No  . Drug use: No     Allergies   Shellfish-derived products   Review of Systems Review of Systems  Constitutional: Positive for fatigue. Negative for chills and fever.  Respiratory: Negative.  Negative for cough and shortness of breath.   Cardiovascular: Positive for chest pain and palpitations. Negative for leg swelling.  Gastrointestinal: Negative for abdominal pain, diarrhea, nausea and vomiting.  Neurological: Negative.  Negative for weakness and headaches.  All other systems reviewed and are negative.    Physical Exam Updated Vital Signs BP (!) 142/77   Pulse 61   Temp 97.8 F (36.6 C) (Oral)   Resp 15   Ht 6' (1.829 m)   Wt 87.1 kg   SpO2 100%   BMI 26.04 kg/m   Physical Exam Constitutional:      General: He is not in acute distress.    Appearance: Normal appearance. He is well-developed. He is not ill-appearing or diaphoretic.  HENT:     Head: Normocephalic and atraumatic.     Right Ear: External ear normal.     Left Ear: External ear normal.     Nose: Nose normal.  Eyes:     General: Vision grossly intact. Gaze aligned appropriately.     Pupils: Pupils are equal, round, and reactive to light.  Neck:     Musculoskeletal: Normal range of motion.      Trachea: Trachea and phonation normal. No tracheal deviation.  Cardiovascular:     Rate and Rhythm: Normal rate and regular rhythm.     Pulses:          Radial pulses are 2+ on the right side and 2+ on the left side.       Dorsalis pedis pulses are 2+ on the right side and 2+ on the left side.       Posterior tibial pulses are 2+ on the right side and 2+ on the left side.     Heart sounds: Normal heart sounds.  Pulmonary:     Effort: Pulmonary effort is normal. No respiratory distress.     Breath sounds: Normal breath sounds.  Chest:     Chest wall: No deformity, tenderness or crepitus.  Abdominal:     General: There is no distension.     Palpations: Abdomen is soft.     Tenderness: There is no abdominal tenderness. There is no guarding or rebound.  Musculoskeletal: Normal range of motion.     Right lower leg: He exhibits no tenderness. Edema present.     Left lower leg: He exhibits no tenderness. Edema present.     Comments: Mild edema present to bilateral lower extremities, symmetric, no tenderness or color change, appropriate warmth.  Patient reports is chronic.  Feet:     Right foot:     Protective Sensation: 5 sites tested. 5 sites sensed.     Left foot:     Protective Sensation: 5 sites tested. 5 sites sensed.  Skin:    General: Skin is warm and dry.  Neurological:     Mental Status: He is alert.     GCS: GCS eye subscore is 4. GCS verbal subscore is 5. GCS motor subscore is 6.     Comments: Speech is clear and goal oriented, follows commands Major Cranial nerves without deficit, no facial droop Moves extremities  without ataxia, coordination intact  Psychiatric:        Behavior: Behavior normal.      ED Treatments / Results  Labs (all labs ordered are listed, but only abnormal results are displayed) Labs Reviewed  BASIC METABOLIC PANEL - Abnormal; Notable for the following components:      Result Value   Glucose, Bld 114 (*)    BUN 27 (*)    All other components  within normal limits  TROPONIN I  CBC WITH DIFFERENTIAL/PLATELET  HEPARIN LEVEL (UNFRACTIONATED)    EKG EKG Interpretation  Date/Time:  Tuesday August 30 2018 11:45:24 EDT Ventricular Rate:  74 PR Interval:    QRS Duration: 86 QT Interval:  370 QTC Calculation: 411 R Axis:   9 Text Interpretation:  Sinus rhythm Confirmed by Gerlene Fee [phone]) on 08/30/2018 1:56:23 PM   Radiology Dg Chest Port 1 View  Result Date: 08/30/2018 CLINICAL DATA:  Pain and tachycardia EXAM: PORTABLE CHEST 1 VIEW COMPARISON:  March 16, 2016 FINDINGS: No edema or consolidation. Heart size and pulmonary vascularity are normal. Stents are noted pulmonary arteries. There is aortic atherosclerosis. No adenopathy. No bone lesions. No pneumothorax. IMPRESSION: No edema or consolidation. Stable cardiac silhouette. Aortic Atherosclerosis (ICD10-I70.0). Electronically Signed   By: Lowella Grip III M.D.   On: 08/30/2018 12:01    Procedures Procedures (including critical care time)  Medications Ordered in ED Medications  heparin bolus via infusion 4,000 Units (has no administration in time range)  heparin ADULT infusion 100 units/mL (25000 units/263mL sodium chloride 0.45%) (has no administration in time range)     Initial Impression / Assessment and Plan / ED Course  I have reviewed the triage vital signs and the nursing notes.  Pertinent labs & imaging results that were available during my care of the patient were reviewed by me and considered in my medical decision making (see chart for details).  Clinical Course as of Aug 29 1356  Tue Aug 30, 2018  1324 Discussed with hospitalist, Dr. Jonelle Sidle, seeing patient in the ER for evaluation.   [BM]    Clinical Course User Index [BM] Deliah Boston, PA-C   58 year old male with significant cardiac history and stent placement x2 presents for exertional chest pain that began yesterday along with palpitations.  He describes a pain between his shoulder  blades that is consistent with his previous myocardial infarction.  Patient endorses 1/10 pain on arrival.  He is overall well-appearing and in no acute distress.  Heart regular rate and rhythm, lungs clear to auscultation bilaterally, distal pulses intact x4 extremities, no tachycardia or hypoxia on room air. - CBC within normal limits BMP unremarkable Troponin negative EKG without acute findings reviewed with Dr. Sedonia Small Chest x-ray: IMPRESSION: No edema or consolidation. Stable cardiac silhouette. Aortic Atherosclerosis - Based on history concern for ACS at this time we will seek admission for cardiac rule out.  Low suspicion for PE or dissection at this time based on physical examination vital signs.  Patient reports location of pain consistent with previous MI.  Distal pulses intact and equal, patient is comfortable appearing.  No tachycardia or hypoxia on room air.  Discussed plan of care and results with patient who is agreeable for admission at this time.  Patient seen and evaluated by Dr. Sedonia Small during this visit who agrees with work-up and admission at this time. - Discussed case with hospitalist Dr. Jonelle Sidle who is seeing patient for admission. - Patient has been admitted to hospitalist service.  Note: Portions of this report may have been transcribed using voice recognition software. Every effort was made to ensure accuracy; however, inadvertent computerized transcription errors may still be present. Final Clinical Impressions(s) / ED Diagnoses   Final diagnoses:  Chest pain, unspecified type    ED Discharge Orders    None       Gari Crown 08/30/18 1428    Maudie Flakes, MD 08/30/18 1541

## 2018-08-30 NOTE — Progress Notes (Signed)
Patient states he normally takes his home medication at dinner for his blood pressure. No orders received at this time for blood pressure medications. Jonelle Sidle, MD notified.

## 2018-08-31 ENCOUNTER — Telehealth: Payer: Self-pay | Admitting: Physician Assistant

## 2018-08-31 ENCOUNTER — Encounter (HOSPITAL_COMMUNITY)
Admission: EM | Disposition: A | Discharge status: Home / Self Care | Payer: Self-pay | Source: Home / Self Care | Attending: Emergency Medicine

## 2018-08-31 ENCOUNTER — Encounter (HOSPITAL_COMMUNITY): Payer: Self-pay | Admitting: Cardiology

## 2018-08-31 DIAGNOSIS — I2510 Atherosclerotic heart disease of native coronary artery without angina pectoris: Secondary | ICD-10-CM | POA: Diagnosis not present

## 2018-08-31 DIAGNOSIS — K219 Gastro-esophageal reflux disease without esophagitis: Secondary | ICD-10-CM | POA: Diagnosis not present

## 2018-08-31 DIAGNOSIS — I25118 Atherosclerotic heart disease of native coronary artery with other forms of angina pectoris: Secondary | ICD-10-CM

## 2018-08-31 DIAGNOSIS — E782 Mixed hyperlipidemia: Secondary | ICD-10-CM

## 2018-08-31 DIAGNOSIS — R0789 Other chest pain: Secondary | ICD-10-CM

## 2018-08-31 DIAGNOSIS — I200 Unstable angina: Secondary | ICD-10-CM

## 2018-08-31 DIAGNOSIS — R079 Chest pain, unspecified: Secondary | ICD-10-CM

## 2018-08-31 DIAGNOSIS — I10 Essential (primary) hypertension: Secondary | ICD-10-CM | POA: Diagnosis not present

## 2018-08-31 HISTORY — PX: LEFT HEART CATH AND CORONARY ANGIOGRAPHY: CATH118249

## 2018-08-31 LAB — CBC
HCT: 42.3 % (ref 39.0–52.0)
Hemoglobin: 14.1 g/dL (ref 13.0–17.0)
MCH: 29.6 pg (ref 26.0–34.0)
MCHC: 33.3 g/dL (ref 30.0–36.0)
MCV: 88.7 fL (ref 80.0–100.0)
Platelets: 190 10*3/uL (ref 150–400)
RBC: 4.77 MIL/uL (ref 4.22–5.81)
RDW: 12.5 % (ref 11.5–15.5)
WBC: 7.5 10*3/uL (ref 4.0–10.5)
nRBC: 0 % (ref 0.0–0.2)

## 2018-08-31 LAB — LIPID PANEL
Cholesterol: 123 mg/dL (ref 0–200)
HDL: 39 mg/dL — ABNORMAL LOW (ref >40)
LDL Cholesterol: 65 mg/dL (ref 0–99)
Total CHOL/HDL Ratio: 3.2 RATIO
Triglycerides: 93 mg/dL (ref <150)
VLDL: 19 mg/dL (ref 0–40)

## 2018-08-31 LAB — HIV ANTIBODY (ROUTINE TESTING W REFLEX): HIV Screen 4th Generation wRfx: NONREACTIVE

## 2018-08-31 LAB — TROPONIN I: Troponin I: <0.03 ng/mL (ref <0.03)

## 2018-08-31 LAB — HEMOGLOBIN A1C
Hgb A1c MFr Bld: 5.5 % (ref 4.8–5.6)
Mean Plasma Glucose: 111.15 mg/dL

## 2018-08-31 LAB — HEPARIN LEVEL (UNFRACTIONATED): Heparin Unfractionated: 0.44 IU/mL (ref 0.30–0.70)

## 2018-08-31 SURGERY — LEFT HEART CATH AND CORONARY ANGIOGRAPHY
Anesthesia: LOCAL

## 2018-08-31 MED ORDER — HEPARIN (PORCINE) IN NACL 1000-0.9 UT/500ML-% IV SOLN
INTRAVENOUS | Status: AC
  Filled 2018-08-31: qty 1000

## 2018-08-31 MED ORDER — LABETALOL HCL 5 MG/ML IV SOLN: 10.0000 mg | INTRAVENOUS | Status: DC | PRN

## 2018-08-31 MED ORDER — VERAPAMIL HCL 2.5 MG/ML IV SOLN
INTRAVENOUS | Status: DC | PRN
  Administered 2018-08-31: 10 mL via INTRA_ARTERIAL

## 2018-08-31 MED ORDER — HEPARIN (PORCINE) IN NACL 1000-0.9 UT/500ML-% IV SOLN
INTRAVENOUS | Status: DC | PRN
  Administered 2018-08-31 (×2): 500 mL

## 2018-08-31 MED ORDER — SODIUM CHLORIDE 0.9 % IV SOLN: 250.0000 mL | INTRAVENOUS | Status: DC | PRN

## 2018-08-31 MED ORDER — SODIUM CHLORIDE 0.9% FLUSH: 3.0000 mL | INTRAVENOUS | Status: DC | PRN

## 2018-08-31 MED ORDER — HEPARIN SODIUM (PORCINE) 1000 UNIT/ML IJ SOLN
INTRAMUSCULAR | Status: DC | PRN
  Administered 2018-08-31: 5000 [IU] via INTRAVENOUS

## 2018-08-31 MED ORDER — SODIUM CHLORIDE 0.9 % IV SOLN
INTRAVENOUS | Status: DC
  Administered 2018-08-31: 12:00:00 via INTRAVENOUS

## 2018-08-31 MED ORDER — SODIUM CHLORIDE 0.9 % WEIGHT BASED INFUSION
1.0000 mL/kg/h | INTRAVENOUS | Status: DC
  Administered 2018-08-31: 10:00:00 1 mL/kg/h via INTRAVENOUS

## 2018-08-31 MED ORDER — VERAPAMIL HCL 2.5 MG/ML IV SOLN
INTRAVENOUS | Status: AC
  Filled 2018-08-31: qty 2

## 2018-08-31 MED ORDER — SODIUM CHLORIDE 0.9% FLUSH: 3.0000 mL | Freq: Two times a day (BID) | INTRAVENOUS | Status: DC

## 2018-08-31 MED ORDER — FENTANYL CITRATE (PF) 100 MCG/2ML IJ SOLN
INTRAMUSCULAR | Status: DC | PRN
  Administered 2018-08-31: 25 ug via INTRAVENOUS

## 2018-08-31 MED ORDER — IOHEXOL 350 MG/ML SOLN
INTRAVENOUS | Status: DC | PRN
  Administered 2018-08-31: 70 mL via INTRAVENOUS

## 2018-08-31 MED ORDER — SODIUM CHLORIDE 0.9% FLUSH
3.0000 mL | Freq: Two times a day (BID) | INTRAVENOUS | Status: DC
  Administered 2018-08-31: 10:00:00 3 mL via INTRAVENOUS

## 2018-08-31 MED ORDER — MIDAZOLAM HCL 2 MG/2ML IJ SOLN
INTRAMUSCULAR | Status: DC | PRN
  Administered 2018-08-31: 2 mg via INTRAVENOUS

## 2018-08-31 MED ORDER — ASPIRIN 81 MG PO CHEW: 81.0000 mg | CHEWABLE_TABLET | ORAL | Status: AC

## 2018-08-31 MED ORDER — LIDOCAINE HCL (PF) 1 % IJ SOLN
INTRAMUSCULAR | Status: AC
  Filled 2018-08-31: qty 30

## 2018-08-31 MED ORDER — ROSUVASTATIN CALCIUM 20 MG PO TABS: 20.0000 mg | ORAL_TABLET | Freq: Every day | ORAL | 0 refills | Status: DC

## 2018-08-31 MED ORDER — HYDRALAZINE HCL 20 MG/ML IJ SOLN: 10.0000 mg | INTRAMUSCULAR | Status: DC | PRN

## 2018-08-31 MED ORDER — ISOSORBIDE MONONITRATE ER 30 MG PO TB24
15.0000 mg | ORAL_TABLET | Freq: Every day | ORAL | Status: DC
  Administered 2018-08-31: 13:00:00 15 mg via ORAL
  Filled 2018-08-31: qty 1

## 2018-08-31 MED ORDER — ROSUVASTATIN CALCIUM 20 MG PO TABS: 20.0000 mg | ORAL_TABLET | Freq: Every day | ORAL | Status: DC

## 2018-08-31 MED ORDER — ISOSORBIDE MONONITRATE ER 30 MG PO TB24: 15.0000 mg | ORAL_TABLET | Freq: Every day | ORAL | 0 refills | Status: DC

## 2018-08-31 MED ORDER — LIDOCAINE HCL (PF) 1 % IJ SOLN
INTRAMUSCULAR | Status: DC | PRN
  Administered 2018-08-31: 2 mL

## 2018-08-31 MED ORDER — FENTANYL CITRATE (PF) 100 MCG/2ML IJ SOLN
INTRAMUSCULAR | Status: AC
  Filled 2018-08-31: qty 2

## 2018-08-31 MED ORDER — MIDAZOLAM HCL 2 MG/2ML IJ SOLN
INTRAMUSCULAR | Status: AC
  Filled 2018-08-31: qty 2

## 2018-08-31 MED ORDER — SODIUM CHLORIDE 0.9 % WEIGHT BASED INFUSION
3.0000 mL/kg/h | INTRAVENOUS | Status: DC
  Administered 2018-08-31: 10:00:00 3 mL/kg/h via INTRAVENOUS

## 2018-08-31 MED FILL — ISOSORBIDE MN ER 30 MG TAB: 30 | 30 days supply | Qty: 15 | Fill #0

## 2018-08-31 SURGICAL SUPPLY — 11 items
CATH INFINITI JR4 5F (CATHETERS) ×2 IMPLANT
CATH OPTITORQUE TIG 4.0 5F (CATHETERS) ×2 IMPLANT
DEVICE RAD COMP TR BAND LRG (VASCULAR PRODUCTS) ×2 IMPLANT
GLIDESHEATH SLEND SS 6F .021 (SHEATH) ×2 IMPLANT
GUIDEWIRE INQWIRE 1.5J.035X260 (WIRE) ×1 IMPLANT
INQWIRE 1.5J .035X260CM (WIRE) ×2
KIT HEART LEFT (KITS) ×2 IMPLANT
PACK CARDIAC CATHETERIZATION (CUSTOM PROCEDURE TRAY) ×2 IMPLANT
SHEATH PROBE COVER 6X72 (BAG) ×2 IMPLANT
TRANSDUCER W/STOPCOCK (MISCELLANEOUS) ×2 IMPLANT
TUBING CIL FLEX 10 FLL-RA (TUBING) ×2 IMPLANT

## 2018-08-31 NOTE — Consult Note (Signed)
Cardiology Consultation:   Patient ID: [name] [name]; [id number]; 1960/11/23   Admit date: 08/30/2018 Date of Consult: 08/31/2018  Primary Care Provider: Tobe Sos, MD Primary Cardiologist: Buford Dresser, MD Primary Electrophysiologist:  None   Patient Profile:   [name] [name] is a 58 y.o. male with a PMH of CAD s/p PCI/DES to LAD and diagonal in 2007, PL in 2014, HTN, HLD, GERD, colong cancer s/p resection/chemo, and OSA who is being seen today for the evaluation of unstable angina at the request of Dr. Reesa Chew.  History of Present Illness:   [name] [name] was in his usual state of health until 08/29/2018 when he began experiencing the feeling of his heart racing while cutting a tree down on his property. He reported the increase heart rate gradually returned to normal over the course of a few hours, however his symptoms returned whenever he exerted himself. He also reported intermittent upper back pressure which awoke him from sleep 08/29/2018 and resolved after a few minutes, however reoccurred several times throughout the day 08/30/2018. He reported symptoms were similar to previous presentations for NSTEMI's. Given recurrent symptoms, he presented to the ED for further evaluation.  His last evaluation by cardiology was outpatient with Dr. Harrell Gave 03/2018, at which time he was without cardiac complaints. No medication changes occurred at that visit. His last ischemic evaluation was a cardiac catheterization in 2015 which revealed no significant obstructive disease and patent stents in the LAD and PLA branch.  At the time of this evaluation he is currently pain free. He reported having some upper back pressure last night which resolved spontaneously. He Also reports that his baseline HR is typically in the 50s-60s, however every time he stands up, his HR jumps to 110s which is unusual for him. He denies dizziness, lightheadedness, SOB, nausea, vomiting, diaphoresis,  recent illness, or changes in medications.   Hospital course: intermittently bradycardic to the 50s, intermittently hypertensive with SBP in the 140s, otherwise VSS. Labs notable for electrolytes wnl, Cr 0.93, CBC wnl, and trop negative x4. EKG with sinus rhythm, no STE/D, no TWI. CXR without acute findings. He was admitted to medicine and started on a heparin gtt for possible ACS. Cardiology asked to evaluate for unstable angina.   Past Medical History:  Diagnosis Date  . ADENOCARCINOMA, SIGMOID COLON    a. s/p resection/chemo  . Allergy    SEASONAL  . Anginal pain ([place])   . BARRETT'S ESOPHAGUS, HX OF   . CAD    a. 2007: DES to LAD and Diag;  b. 08/2011 non-ischemic myoview, EF 60%;  c. 08/2012 NSTEMI/Cath/PCI: LM n, LAD patent stent, D1 small, 80, D2 90 ISR (PTCA), LCX nl, RCA 20d, RPL 95 (2.25x12 Promus Premier DES), EF 55-65%.  . Diverticulosis   . DYSLIPIDEMIA   . GERD (gastroesophageal reflux disease)   . HIATAL HERNIA WITH REFLUX   . Hyperplastic colon polyp   . HYPERTENSION   . Myocardial infarction ([place])   . 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 s/p wedge resection  . SLEEP APNEA   . Sleep apnea     Past Surgical History:  Procedure Laterality Date  . COLON RESECTION  1998  . COLON SURGERY    . CORONARY STENT PLACEMENT  2007   taxus  . INGUINAL HERNIA REPAIR N/A 05/31/2017   Procedure: BILATERAL LAPAROSCOPIC INGUINAL HERNIA REPAIR WITH MESH;  Surgeon: Coralie Keens, MD;  Location: [place];  Service: General;  Laterality: N/A;  .  INSERTION OF MESH Bilateral 05/31/2017   Procedure: INSERTION OF MESH;  Surgeon: Coralie Keens, MD;  Location: [place];  Service: General;  Laterality: Bilateral;  . KNEE SURGERY    . LEFT HEART CATHETERIZATION WITH CORONARY ANGIOGRAM N/A 08/22/2012   Procedure: LEFT HEART CATHETERIZATION WITH CORONARY ANGIOGRAM;  Surgeon: Peter M Martinique, MD;  Location: [facility] CATH LAB;  Service: Cardiovascular;  Laterality: N/A;  . LEFT HEART  CATHETERIZATION WITH CORONARY ANGIOGRAM N/A 09/12/2013   Procedure: LEFT HEART CATHETERIZATION WITH CORONARY ANGIOGRAM;  Surgeon: Troy Sine, MD;  Location[facility] C[facility] LAB;  Service: Cardiovascular;  Laterality: N/A;  . LUNG REMOVAL, PARTIAL     LEFT  . TONSILLECTOMY       Home Medications:  Prior to Admission medications   Medication Sig Start Date End Date Taking? Authorizing Provider  amLODipine (NORVASC) 5 MG tablet Take 5 mg by mouth 2 (two) times daily.    Yes [provider]  aspirin EC 81 MG tablet Take 1 tablet (81 mg total) by mouth daily. 04/30/17  Yes Isaiah Serge, NP  Cholecalciferol (VITAMIN D3) 2000 UNITS capsule Take 2,000 Units by mouth daily.  01/29/12  Yes [provider]  clopidogrel (PLAVIX) 75 MG tablet Take 1 tablet (75 mg total) by mouth daily. 08/23/12  Yes Theora Gianotti, NP  cyanocobalamin (,VITAMIN B-12,) 1000 MCG/ML injection Inject 1,000 mcg into the muscle every 30 (thirty) days.  11/23/14  Yes [provider]  DEXILANT 60 MG capsule Take 60 mg by mouth daily.  07/19/11  Yes [provider]  enalapril (VASOTEC) 10 MG tablet Take 10 mg by mouth 2 (two) times daily.   Yes [provider]  ezetimibe (ZETIA) 10 MG tablet Take 10 mg by mouth daily.   Yes [provider]  fexofenadine (ALLEGRA) 180 MG tablet Take 180 mg by mouth daily as needed for allergies or rhinitis.   Yes [provider]  ibuprofen (ADVIL,MOTRIN) 800 MG tablet Take 800 mg by mouth every 8 (eight) hours as needed for headache or moderate pain.   Yes [provider]  loratadine (CLARITIN) 10 MG tablet Take 10 mg by mouth daily as needed for allergies.  01/29/12  Yes [provider]  metoprolol succinate (TOPROL-XL) 25 MG 24 hr tablet Take 25 mg by mouth every evening.    Yes [provider]  montelukast (SINGULAIR) 10 MG tablet Take 10 mg by mouth daily as needed (for allergies).   Yes [provider]  Multiple Vitamins-Minerals (MULTIVITAMIN PO) Take 1 tablet by mouth daily.   Yes [provider]  omega-3 acid ethyl esters (LOVAZA) 1 G capsule Take 2 g by mouth 2 (two) times daily.    Yes [provider]  rosuvastatin (CRESTOR) 20 MG tablet Take 10 mg by mouth daily.    Yes [provider]  oxyCODONE (OXY IR/ROXICODONE) 5 MG immediate release tablet Take 1-2 tablets (5-10 mg total) by mouth every 6 (six) hours as needed for moderate pain, severe pain or breakthrough pain. Patient not taking: Reported on 08/30/2018 05/31/17   Coralie Keens, MD    Inpatient Medications: Scheduled Meds: . amLODipine  5 mg Oral BID  . aspirin EC  81 mg Oral Daily  . cholecalciferol  2,000 Units Oral Daily  . clopidogrel  75 mg Oral Daily  . enalapril  10 mg Oral BID  . ezetimibe  10 mg Oral Daily  . loratadine  10 mg Oral Daily  . metoprolol  succinate  25 mg Oral QPM  . multivitamin   Oral Daily  . omega-3 acid ethyl esters  2 g Oral BID  . pantoprazole  40 mg Oral Daily  . rosuvastatin  10 mg Oral Daily   Continuous Infusions: . sodium chloride 75 mL/hr at 08/31/18 0352  . heparin 1,100 Units/hr (08/30/18 1403)   PRN Meds: acetaminophen, ALPRAZolam, ibuprofen, loratadine, montelukast, ondansetron (ZOFRAN) IV  Allergies:    Allergies  Allergen Reactions  . Shellfish-Derived Products Anaphylaxis, Swelling and Other (See Comments)    Large amounts of crab    Social History:   Social History   Socioeconomic History  . Marital status: Married    Spouse name: Not on file  . Number of children: Not on file  . Years of education: Not on file  . Highest education level: Not on file  Occupational History  . Not on file  Social Needs  . Financial resource strain: Not on file  . Food insecurity:    Worry: Not on file    Inability: Not on file  . Transportation needs:    Medical: Not on file    Non-medical: Not on file  Tobacco Use  . Smoking  status: Never Smoker  . Smokeless tobacco: Never Used  Substance and Sexual Activity  . Alcohol use: No  . Drug use: No  . Sexual activity: Yes  Lifestyle  . Physical activity:    Days per week: Not on file    Minutes per session: Not on file  . Stress: Not on file  Relationships  . Social connections:    Talks on phone: Not on file    Gets together: Not on file    Attends religious service: Not on file    Active member of club or organization: Not on file    Attends meetings of clubs or organizations: Not on file    Relationship status: Not on file  . Intimate partner violence:    Fear of current or ex partner: Not on file    Emotionally abused: Not on file    Physically abused: Not on file    Forced sexual activity: Not on file  Other Topics Concern  . Not on file  Social History Narrative   Lives in [place], [place] with his wife and son.  Works for [employer] in [employer].    Family History:    Family History  Problem Relation Age of Onset  . Heart disease Mother        alive @ 56.  s/p cabg  . Colon polyps Mother   . Heart disease Father        alive @ 33.  s/p stenting  . Heart attack Paternal Uncle        @ 5  . Heart attack Paternal Uncle        @ 9  . Colon cancer Neg Hx      ROS:  Please see the history of present illness.   All other ROS reviewed and negative.     Physical Exam/Data:   Vitals:   08/30/18 1405 08/30/18 1435 08/30/18 2055 08/31/18 0524  BP: 129/76 135/80 130/78 130/88  Pulse: 62  (!) 52 (!) 57  Resp: (!) 22 20 18 18   Temp:  98.1 F (36.7 C) (!) 97.5 F (36.4 C) 97.6 F (36.4 C)  TempSrc:  Oral Oral Oral  SpO2: 100% 100% 98%   Weight:  89.1 kg  88.9 kg  Height:  5'  11" (1.803 m)      Intake/Output Summary (Last 24 hours) at 08/31/2018 0748 Last data filed at 08/31/2018 0100 Gross per 24 hour  Intake 381.76 ml  Output -  Net 381.76 ml   Filed Weights   08/30/18 1151 08/30/18 1435 08/31/18 0524  Weight: 87.1 kg 89.1 kg 88.9  kg   Body mass index is 27.32 kg/m.  General:  Well nourished, well developed, in no acute distress HEENT: sclera anicteric  Lymph: no adenopathy Neck: no JVD Endocrine:  No thryomegaly Vascular: No carotid bruits; distal pulses 2+ bilaterally Cardiac:  normal S1, S2; RRR; no murmur  Lungs:  clear to auscultation bilaterally, no wheezing, rhonchi or rales  Abd: NABS, soft, nontender, no hepatomegaly Ext: no edema Musculoskeletal:  No deformities, BUE and BLE strength normal and equal Skin: warm and dry  Neuro:  CNs 2-12 intact, no focal abnormalities noted Psych:  Normal affect   EKG:  The EKG was personally reviewed and demonstrates:  Sinus rhythm with no STE/D, no TWI Telemetry:  Telemetry was personally reviewed and demonstrates:  SB to SR  Relevant CV Studies: Cardiac catheterization 08-24-13: IMPRESSION:  Normal LV function with an ejection fraction of 55%  No significant residual coronary obstructive disease with evidence for a widely patent mid LAD stent after the takeoff of the second diagonal vessel with 20-30% mild narrowing in the proximal segment of the in the diagonal vessel; normal left circumflex coronary artery; mild 20-30% mid RCA stenosis with 20% ostial PDA stenosis and a widely patent stent in the PLA branch of the right coronary artery.  RECOMMENDATION:  Medical therapy with continued antiplatelet therapy, optimal blood pressure control, and continued aggressive treatment of hyperlipidemia.  Laboratory Data:  Chemistry Recent Labs  Lab 08/30/18 1155  NA 139  K 3.6  CL 105  CO2 22  GLUCOSE 114*  BUN 27*  CREATININE 0.93  CALCIUM 9.8  GFRNONAA >60  GFRAA >60  ANIONGAP 12    No results for input(s): PROT, ALBUMIN, AST, ALT, ALKPHOS, BILITOT in the last 168 hours. Hematology Recent Labs  Lab 08/30/18 1155 08/31/18 0248  WBC 8.9 7.5  RBC 5.08 4.77  HGB 14.9 14.1  HCT 45.7 42.3  MCV 90.0 88.7  MCH 29.3 29.6  MCHC 32.6 33.3  RDW 12.4 12.5   PLT 233 190   Cardiac Enzymes Recent Labs  Lab 08/30/18 1155 08/30/18 1532 08/30/18 08-24-29 08/31/18 0248  TROPONINI <0.03 <0.03 <0.03 <0.03   No results for input(s): TROPIPOC in the last 168 hours.  BNPNo results for input(s): BNP, PROBNP in the last 168 hours.  DDimer No results for input(s): DDIMER in the last 168 hours.  Radiology/Studies:  Dg Chest Port 1 View  Result Date: 08/30/2018 CLINICAL DATA:  Pain and tachycardia EXAM: PORTABLE CHEST 1 VIEW COMPARISON:  March 16, 2016 FINDINGS: No edema or consolidation. Heart size and pulmonary vascularity are normal. Stents are noted pulmonary arteries. There is aortic atherosclerosis. No adenopathy. No bone lesions. No pneumothorax. IMPRESSION: No edema or consolidation. Stable cardiac silhouette. Aortic Atherosclerosis (ICD10-I70.0). Electronically Signed   By: Lowella Grip III M.D.   On: 08/30/2018 12:01    Assessment and Plan:   1. Unstable angina in patient with CAD s/p PCI to LAD and PLA (last stent in 24-Aug-2012): patient presented with intermittent upper back pain and palpitations with exertion c/w angina experienced prior to PCI. Last intervention was in 08-24-2012 and last cath in 08/24/13 showed patent stents. Trops have been negative  and EKG non-ischemic this admission. He was started on a heparin gtt for possible ACS. Symptoms are concerning for unstable angina  - Will plan for cardiac catheterization today to further evaluate symptoms - keep NPO - Continue heparin gtt - Continue aspirin, plavix, and statin - Continue metoprolol and amlodipine  2. HTN: BP overall stable - Continue metoprolol, amlodipine, and enalapril  3. HLD: no recent lipids on file - Will check lipid panel - Will increase home crestor to 20mg  daily for high-intensity dosing - Continue omega 3 and zetia  The patient undergoes intervention we will plan for discharge tomorrow otherwise discharge later today.  For blood pressure control we can increase enalapril  to 20 mg daily or add indoor 30 mg daily.  His lipid panel has not been checked in a while, if LDL above 70 he should be referred to the lipid clinic for further management.  For questions or updates, please contact [place] Please consult [email or web address] for contact info under Cardiology/STEMI.   Signed, Ena Dawley, MD 08/31/2018 7:48 AM [phone]

## 2018-08-31 NOTE — Progress Notes (Signed)
[facility] will sign off.   Medication Recommendations:  Add imdur 15 mg po daily Other recommendations (labs, testing, etc):  No further testing, the patient can be discharged today Follow up as an outpatient:  We will arrange  Ena Dawley, MD 08/31/2018

## 2018-08-31 NOTE — H&P (View-Only) (Signed)
Cardiology Consultation:   Patient ID: [name] [name]; [id number]; 1960/05/25   Admit date: 08/30/2018 Date of Consult: 08/31/2018  Primary Care Provider: Tobe Sos, MD Primary Cardiologist: Buford Dresser, MD Primary Electrophysiologist:  None   Patient Profile:   [name] [name] is a 58 y.o. male with a PMH of CAD s/p PCI/DES to LAD and diagonal in 2007, PL in 2014, HTN, HLD, GERD, colong cancer s/p resection/chemo, and OSA who is being seen today for the evaluation of unstable angina at the request of Dr. Reesa Chew.  History of Present Illness:   [name] [name] was in his usual state of health until 08/29/2018 when he began experiencing the feeling of his heart racing while cutting a tree down on his property. He reported the increase heart rate gradually returned to normal over the course of a few hours, however his symptoms returned whenever he exerted himself. He also reported intermittent upper back pressure which awoke him from sleep 08/29/2018 and resolved after a few minutes, however reoccurred several times throughout the day 08/30/2018. He reported symptoms were similar to previous presentations for NSTEMI's. Given recurrent symptoms, he presented to the ED for further evaluation.  His last evaluation by cardiology was outpatient with Dr. Harrell Gave 03/2018, at which time he was without cardiac complaints. No medication changes occurred at that visit. His last ischemic evaluation was a cardiac catheterization in 2015 which revealed no significant obstructive disease and patent stents in the LAD and PLA branch.  At the time of this evaluation he is currently pain free. He reported having some upper back pressure last night which resolved spontaneously. He Also reports that his baseline HR is typically in the 50s-60s, however every time he stands up, his HR jumps to 110s which is unusual for him. He denies dizziness, lightheadedness, SOB, nausea, vomiting, diaphoresis,  recent illness, or changes in medications.   Hospital course: intermittently bradycardic to the 50s, intermittently hypertensive with SBP in the 140s, otherwise VSS. Labs notable for electrolytes wnl, Cr 0.93, CBC wnl, and trop negative x4. EKG with sinus rhythm, no STE/D, no TWI. CXR without acute findings. He was admitted to medicine and started on a heparin gtt for possible ACS. Cardiology asked to evaluate for unstable angina.   Past Medical History:  Diagnosis Date  . ADENOCARCINOMA, SIGMOID COLON    a. s/p resection/chemo  . Allergy    SEASONAL  . Anginal pain ([place])   . BARRETT'S ESOPHAGUS, HX OF   . CAD    a. 2007: DES to LAD and Diag;  b. 08/2011 non-ischemic myoview, EF 60%;  c. 08/2012 NSTEMI/Cath/PCI: LM n, LAD patent stent, D1 small, 80, D2 90 ISR (PTCA), LCX nl, RCA 20d, RPL 95 (2.25x12 Promus Premier DES), EF 55-65%.  . Diverticulosis   . DYSLIPIDEMIA   . GERD (gastroesophageal reflux disease)   . HIATAL HERNIA WITH REFLUX   . Hyperplastic colon polyp   . HYPERTENSION   . Myocardial infarction ([place])   . 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 s/p wedge resection  . SLEEP APNEA   . Sleep apnea     Past Surgical History:  Procedure Laterality Date  . COLON RESECTION  1998  . COLON SURGERY    . CORONARY STENT PLACEMENT  2007   taxus  . INGUINAL HERNIA REPAIR N/A 05/31/2017   Procedure: BILATERAL LAPAROSCOPIC INGUINAL HERNIA REPAIR WITH MESH;  Surgeon: Coralie Keens, MD;  Location: [facility];  Service: General;  Laterality: N/A;  .  INSERTION OF MESH Bilateral 05/31/2017   Procedure: INSERTION OF MESH;  Surgeon: Coralie Keens, MD;  Location: [facility];  Service: General;  Laterality: Bilateral;  . KNEE SURGERY    . LEFT HEART CATHETERIZATION WITH CORONARY ANGIOGRAM N/A 08/22/2012   Procedure: LEFT HEART CATHETERIZATION WITH CORONARY ANGIOGRAM;  Surgeon: Peter M Martinique, MD;  Location: [facility] CATH LAB;  Service: Cardiovascular;  Laterality: N/A;  . LEFT HEART  CATHETERIZATION WITH CORONARY ANGIOGRAM N/A 09/12/2013   Procedure: LEFT HEART CATHETERIZATION WITH CORONARY ANGIOGRAM;  Surgeon: Troy Sine, MD;  Location: [facility] CATH LAB;  Service: Cardiovascular;  Laterality: N/A;  . LUNG REMOVAL, PARTIAL     LEFT  . TONSILLECTOMY       Home Medications:  Prior to Admission medications   Medication Sig Start Date End Date Taking? Authorizing Provider  amLODipine (NORVASC) 5 MG tablet Take 5 mg by mouth 2 (two) times daily.    Yes [provider]  aspirin EC 81 MG tablet Take 1 tablet (81 mg total) by mouth daily. 04/30/17  Yes Isaiah Serge, NP  Cholecalciferol (VITAMIN D3) 2000 UNITS capsule Take 2,000 Units by mouth daily.  01/29/12  Yes [provider]  clopidogrel (PLAVIX) 75 MG tablet Take 1 tablet (75 mg total) by mouth daily. 08/23/12  Yes Theora Gianotti, NP  cyanocobalamin (,VITAMIN B-12,) 1000 MCG/ML injection Inject 1,000 mcg into the muscle every 30 (thirty) days.  11/23/14  Yes [provider]  DEXILANT 60 MG capsule Take 60 mg by mouth daily.  07/19/11  Yes [provider]  enalapril (VASOTEC) 10 MG tablet Take 10 mg by mouth 2 (two) times daily.   Yes [provider]  ezetimibe (ZETIA) 10 MG tablet Take 10 mg by mouth daily.   Yes [provider]  fexofenadine (ALLEGRA) 180 MG tablet Take 180 mg by mouth daily as needed for allergies or rhinitis.   Yes [provider]  ibuprofen (ADVIL,MOTRIN) 800 MG tablet Take 800 mg by mouth every 8 (eight) hours as needed for headache or moderate pain.   Yes [provider]  loratadine (CLARITIN) 10 MG tablet Take 10 mg by mouth daily as needed for allergies.  01/29/12  Yes [provider]  metoprolol succinate (TOPROL-XL) 25 MG 24 hr tablet Take 25 mg by mouth every evening.    Yes [provider]  montelukast (SINGULAIR) 10 MG tablet Take 10 mg by mouth daily as needed (for allergies).   Yes [provider]  Multiple Vitamins-Minerals (MULTIVITAMIN PO) Take 1 tablet by mouth daily.   Yes [provider]  omega-3 acid ethyl esters (LOVAZA) 1 G capsule Take 2 g by mouth 2 (two) times daily.    Yes [provider]  rosuvastatin (CRESTOR) 20 MG tablet Take 10 mg by mouth daily.    Yes [provider]  oxyCODONE (OXY IR/ROXICODONE) 5 MG immediate release tablet Take 1-2 tablets (5-10 mg total) by mouth every 6 (six) hours as needed for moderate pain, severe pain or breakthrough pain. Patient not taking: Reported on 08/30/2018 05/31/17   Coralie Keens, MD    Inpatient Medications: Scheduled Meds: . amLODipine  5 mg Oral BID  . aspirin EC  81 mg Oral Daily  . cholecalciferol  2,000 Units Oral Daily  . clopidogrel  75 mg Oral Daily  . enalapril  10 mg Oral BID  . ezetimibe  10 mg Oral Daily  . loratadine  10 mg Oral Daily  . metoprolol  succinate  25 mg Oral QPM  . multivitamin   Oral Daily  . omega-3 acid ethyl esters  2 g Oral BID  . pantoprazole  40 mg Oral Daily  . rosuvastatin  10 mg Oral Daily   Continuous Infusions: . sodium chloride 75 mL/hr at 08/31/18 0352  . heparin 1,100 Units/hr (08/30/18 1403)   PRN Meds: acetaminophen, ALPRAZolam, ibuprofen, loratadine, montelukast, ondansetron (ZOFRAN) IV  Allergies:    Allergies  Allergen Reactions  . Shellfish-Derived Products Anaphylaxis, Swelling and Other (See Comments)    Large amounts of crab    Social History:   Social History   Socioeconomic History  . Marital status: Married    Spouse name: Not on file  . Number of children: Not on file  . Years of education: Not on file  . Highest education level: Not on file  Occupational History  . Not on file  Social Needs  . Financial resource strain: Not on file  . Food insecurity:    Worry: Not on file    Inability: Not on file  . Transportation needs:    Medical: Not on file    Non-medical: Not on file  Tobacco Use  . Smoking  status: Never Smoker  . Smokeless tobacco: Never Used  Substance and Sexual Activity  . Alcohol use: No  . Drug use: No  . Sexual activity: Yes  Lifestyle  . Physical activity:    Days per week: Not on file    Minutes per session: Not on file  . Stress: Not on file  Relationships  . Social connections:    Talks on phone: Not on file    Gets together: Not on file    Attends religious service: Not on file    Active member of club or organization: Not on file    Attends meetings of clubs or organizations: Not on file    Relationship status: Not on file  . Intimate partner violence:    Fear of current or ex partner: Not on file    Emotionally abused: Not on file    Physically abused: Not on file    Forced sexual activity: Not on file  Other Topics Concern  . Not on file  Social History Narrative   Lives in [place], [place] with his wife and son.  Works for [employer] in [employer].    Family History:    Family History  Problem Relation Age of Onset  . Heart disease Mother        alive @ 53.  s/p cabg  . Colon polyps Mother   . Heart disease Father        alive @ 64.  s/p stenting  . Heart attack Paternal Uncle        @ 27  . Heart attack Paternal Uncle        @ 52  . Colon cancer Neg Hx      ROS:  Please see the history of present illness.   All other ROS reviewed and negative.     Physical Exam/Data:   Vitals:   08/30/18 1405 08/30/18 1435 08/30/18 2055 08/31/18 0524  BP: 129/76 135/80 130/78 130/88  Pulse: 62  (!) 52 (!) 57  Resp: (!) 22 20 18 18   Temp:  98.1 F (36.7 C) (!) 97.5 F (36.4 C) 97.6 F (36.4 C)  TempSrc:  Oral Oral Oral  SpO2: 100% 100% 98%   Weight:  89.1 kg  88.9 kg  Height:  5'  11" (1.803 m)      Intake/Output Summary (Last 24 hours) at 08/31/2018 0748 Last data filed at 08/31/2018 0100 Gross per 24 hour  Intake 381.76 ml  Output -  Net 381.76 ml   Filed Weights   08/30/18 1151 08/30/18 1435 08/31/18 0524  Weight: 87.1 kg 89.1 kg 88.9  kg   Body mass index is 27.32 kg/m.  General:  Well nourished, well developed, in no acute distress HEENT: sclera anicteric  Lymph: no adenopathy Neck: no JVD Endocrine:  No thryomegaly Vascular: No carotid bruits; distal pulses 2+ bilaterally Cardiac:  normal S1, S2; RRR; no murmur  Lungs:  clear to auscultation bilaterally, no wheezing, rhonchi or rales  Abd: NABS, soft, nontender, no hepatomegaly Ext: no edema Musculoskeletal:  No deformities, BUE and BLE strength normal and equal Skin: warm and dry  Neuro:  CNs 2-12 intact, no focal abnormalities noted Psych:  Normal affect   EKG:  The EKG was personally reviewed and demonstrates:  Sinus rhythm with no STE/D, no TWI Telemetry:  Telemetry was personally reviewed and demonstrates:  SB to SR  Relevant CV Studies: Cardiac catheterization 09-03-13: IMPRESSION:  Normal LV function with an ejection fraction of 55%  No significant residual coronary obstructive disease with evidence for a widely patent mid LAD stent after the takeoff of the second diagonal vessel with 20-30% mild narrowing in the proximal segment of the in the diagonal vessel; normal left circumflex coronary artery; mild 20-30% mid RCA stenosis with 20% ostial PDA stenosis and a widely patent stent in the PLA branch of the right coronary artery.  RECOMMENDATION:  Medical therapy with continued antiplatelet therapy, optimal blood pressure control, and continued aggressive treatment of hyperlipidemia.  Laboratory Data:  Chemistry Recent Labs  Lab 08/30/18 1155  NA 139  K 3.6  CL 105  CO2 22  GLUCOSE 114*  BUN 27*  CREATININE 0.93  CALCIUM 9.8  GFRNONAA >60  GFRAA >60  ANIONGAP 12    No results for input(s): PROT, ALBUMIN, AST, ALT, ALKPHOS, BILITOT in the last 168 hours. Hematology Recent Labs  Lab 08/30/18 1155 08/31/18 0248  WBC 8.9 7.5  RBC 5.08 4.77  HGB 14.9 14.1  HCT 45.7 42.3  MCV 90.0 88.7  MCH 29.3 29.6  MCHC 32.6 33.3  RDW 12.4 12.5   PLT 233 190   Cardiac Enzymes Recent Labs  Lab 08/30/18 1155 08/30/18 1532 08/30/18 09-03-2029 08/31/18 0248  TROPONINI <0.03 <0.03 <0.03 <0.03   No results for input(s): TROPIPOC in the last 168 hours.  BNPNo results for input(s): BNP, PROBNP in the last 168 hours.  DDimer No results for input(s): DDIMER in the last 168 hours.  Radiology/Studies:  Dg Chest Port 1 View  Result Date: 08/30/2018 CLINICAL DATA:  Pain and tachycardia EXAM: PORTABLE CHEST 1 VIEW COMPARISON:  March 16, 2016 FINDINGS: No edema or consolidation. Heart size and pulmonary vascularity are normal. Stents are noted pulmonary arteries. There is aortic atherosclerosis. No adenopathy. No bone lesions. No pneumothorax. IMPRESSION: No edema or consolidation. Stable cardiac silhouette. Aortic Atherosclerosis (ICD10-I70.0). Electronically Signed   By: Lowella Grip III M.D.   On: 08/30/2018 12:01    Assessment and Plan:   1. Unstable angina in patient with CAD s/p PCI to LAD and PLA (last stent in 09-03-2012): patient presented with intermittent upper back pain and palpitations with exertion c/w angina experienced prior to PCI. Last intervention was in 2012-09-03 and last cath in 2013/09/03 showed patent stents. Trops have been negative  and EKG non-ischemic this admission. He was started on a heparin gtt for possible ACS. Symptoms are concerning for unstable angina  - Will plan for cardiac catheterization today to further evaluate symptoms - keep NPO - Continue heparin gtt - Continue aspirin, plavix, and statin - Continue metoprolol and amlodipine  2. HTN: BP overall stable - Continue metoprolol, amlodipine, and enalapril  3. HLD: no recent lipids on file - Will check lipid panel - Will increase home crestor to 20mg  daily for high-intensity dosing - Continue omega 3 and zetia  The patient undergoes intervention we will plan for discharge tomorrow otherwise discharge later today.  For blood pressure control we can increase enalapril  to 20 mg daily or add indoor 30 mg daily.  His lipid panel has not been checked in a while, if LDL above 70 he should be referred to the lipid clinic for further management.  For questions or updates, please contact [place] Please consult [email or web address] for contact info under Cardiology/STEMI.   Signed, Ena Dawley, MD 08/31/2018 7:48 AM [phone]

## 2018-08-31 NOTE — Interval H&P Note (Signed)
History and Physical Interval Note:  08/31/2018 10:52 AM  [name] [name]  has presented today for surgery, with the diagnosis of Unstable Angina.   The various methods of treatment have been discussed with the patient and family. After consideration of risks, benefits and other options for treatment, the patient has consented to  Procedure(s): LEFT HEART CATH AND CORONARY ANGIOGRAPHY (N/A) - with possible PERCUTANEOUS CORONARY INTERVENTION as a surgical intervention.    The patient's history has been reviewed, patient examined, no change in status, stable for surgery.  I have reviewed the patient's chart and labs.  Questions were answered to the patient's satisfaction.    Cath Lab Visit (complete for each Cath Lab visit)  Clinical Evaluation Leading to the Procedure:   ACS: Yes.    Non-ACS:    Anginal Classification: CCS III  Anti-ischemic medical therapy: Maximal Therapy (2 or more classes of medications)  Non-Invasive Test Results: No non-invasive testing performed  Prior CABG: No previous CABG    Glenetta Hew

## 2018-08-31 NOTE — Progress Notes (Signed)
PROGRESS NOTE    [name] [name]  DEY:[id number] DOB: [birth year] DOA: 08/30/2018 PCP: Tobe Sos, MD   Brief Narrative:  58 year old with a history of CAD status post PCI/drug-eluting stent to LAD and diagonal in 2007, essential hypertension, hyperlipidemia, GERD, colon cancer status post resection/chemo came to the hospital with complains of palpitations and chest pain.Given his presentation for unstable angina, he was started on heparin drip.  Cardiac enzymes remain negative.  Cardiology team was consulted who recommended left heart catheterization.   Assessment & Plan:   Principal Problem:   Chest pain Active Problems:   ADENOCARCINOMA, SIGMOID COLON   Hypertension, essential   Coronary atherosclerosis of native coronary artery   GERD   HLD (hyperlipidemia)  Unstable angina, improved History of coronary artery disease status post PCI to LAD in PLA - Continue aspirin, Plavix, statin.  On heparin drip. -Continue metoprolol/amlodipine -Plans for left heart catheterization today. -Further medication adjustment per cardiology team.  Hyperlipidemia -Check lipid panel.  Crestor increased to 20 mg daily.  Essential hypertension -Stable on metoprolol, enalapril and amlodipine  History of GERD -PPI  DVT prophylaxis: Heparin drip Code Status: Full code Family Communication: None at bedside Disposition Plan: To be determined after left heart catheterization.  In the meantime continue to keep him in the hospital on heparin drip.  Consultants:   Cardiology  Procedures:   Plans for left heart catheterization today  Antimicrobials:   None   Subjective: No complaints besides slight chest discomfort with movement.  Review of Systems Otherwise negative except as per HPI, including: General: Denies fever, chills, night sweats or unintended weight loss. Resp: Denies cough, wheezing, shortness of breath. Cardiac: Denies chest pain, palpitations, orthopnea,  paroxysmal nocturnal dyspnea. GI: Denies abdominal pain, nausea, vomiting, diarrhea or constipation GU: Denies dysuria, frequency, hesitancy or incontinence MS: Denies muscle aches, joint pain or swelling Neuro: Denies headache, neurologic deficits (focal weakness, numbness, tingling), abnormal gait Psych: Denies anxiety, depression, SI/HI/AVH Skin: Denies new rashes or lesions ID: Denies sick contacts, exotic exposures, travel  Objective: Vitals:   08/30/18 2055 08/31/18 0524 08/31/18 0825 08/31/18 0915  BP: 130/78 130/88 (!) 151/82 140/80  Pulse: (!) 52 (!) 57 69   Resp: 18 18    Temp: (!) 97.5 F (36.4 C) 97.6 F (36.4 C) 97.8 F (36.6 C)   TempSrc: Oral Oral Oral   SpO2: 98%  100% 100%  Weight:  88.9 kg    Height:        Intake/Output Summary (Last 24 hours) at 08/31/2018 1040 Last data filed at 08/31/2018 0827 Gross per 24 hour  Intake 603.76 ml  Output -  Net 603.76 ml   Filed Weights   08/30/18 1151 08/30/18 1435 08/31/18 0524  Weight: 87.1 kg 89.1 kg 88.9 kg    Examination:  General exam: Appears calm and comfortable  Respiratory system: Clear to auscultation. Respiratory effort normal. Cardiovascular system: S1 & S2 heard, RRR. No JVD, murmurs, rubs, gallops or clicks. No pedal edema. Gastrointestinal system: Abdomen is nondistended, soft and nontender. No organomegaly or masses felt. Normal bowel sounds heard. Central nervous system: Alert and oriented. No focal neurological deficits. Extremities: Symmetric 5 x 5 power. Skin: No rashes, lesions or ulcers Psychiatry: Judgement and insight appear normal. Mood & affect appropriate.     Data Reviewed:   CBC: Recent Labs  Lab 08/30/18 1155 08/31/18 0248  WBC 8.9 7.5  NEUTROABS 6.4  --   HGB 14.9 14.1  HCT 45.7 42.3  MCV 90.0  88.7  PLT 233 916   Basic Metabolic Panel: Recent Labs  Lab 08/30/18 1155  NA 139  K 3.6  CL 105  CO2 22  GLUCOSE 114*  BUN 27*  CREATININE 0.93  CALCIUM 9.8   GFR:  Estimated Creatinine Clearance: 93.3 mL/min (by C-G formula based on SCr of 0.93 mg/dL). Liver Function Tests: No results for input(s): AST, ALT, ALKPHOS, BILITOT, PROT, ALBUMIN in the last 168 hours. No results for input(s): LIPASE, AMYLASE in the last 168 hours. No results for input(s): AMMONIA in the last 168 hours. Coagulation Profile: No results for input(s): INR, PROTIME in the last 168 hours. Cardiac Enzymes: Recent Labs  Lab 08/30/18 1155 08/30/18 1532 08/30/18 2031 08/31/18 0248  TROPONINI <0.03 <0.03 <0.03 <0.03   BNP (last 3 results) No results for input(s): PROBNP in the last 8760 hours. HbA1C: No results for input(s): HGBA1C in the last 72 hours. CBG: No results for input(s): GLUCAP in the last 168 hours. Lipid Profile: No results for input(s): CHOL, HDL, LDLCALC, TRIG, CHOLHDL, LDLDIRECT in the last 72 hours. Thyroid Function Tests: No results for input(s): TSH, T4TOTAL, FREET4, T3FREE, THYROIDAB in the last 72 hours. Anemia Panel: No results for input(s): VITAMINB12, FOLATE, FERRITIN, TIBC, IRON, RETICCTPCT in the last 72 hours. Sepsis Labs: No results for input(s): PROCALCITON, LATICACIDVEN in the last 168 hours.  No results found for this or any previous visit (from the past 240 hour(s)).       Radiology Studies: Dg Chest Port 1 View  Result Date: 08/30/2018 CLINICAL DATA:  Pain and tachycardia EXAM: PORTABLE CHEST 1 VIEW COMPARISON:  March 16, 2016 FINDINGS: No edema or consolidation. Heart size and pulmonary vascularity are normal. Stents are noted pulmonary arteries. There is aortic atherosclerosis. No adenopathy. No bone lesions. No pneumothorax. IMPRESSION: No edema or consolidation. Stable cardiac silhouette. Aortic Atherosclerosis (ICD10-I70.0). Electronically Signed   By: Lowella Grip III M.D.   On: 08/30/2018 12:01        Scheduled Meds: . [MAR Hold] amLODipine  5 mg Oral BID  . [MAR Hold] aspirin EC  81 mg Oral Daily  . [MAR Hold]  cholecalciferol  2,000 Units Oral Daily  . [MAR Hold] clopidogrel  75 mg Oral Daily  . [MAR Hold] enalapril  10 mg Oral BID  . [MAR Hold] ezetimibe  10 mg Oral Daily  . [MAR Hold] metoprolol succinate  25 mg Oral QPM  . [MAR Hold] multivitamin   Oral Daily  . [MAR Hold] omega-3 acid ethyl esters  2 g Oral BID  . [MAR Hold] pantoprazole  40 mg Oral Daily  . [MAR Hold] rosuvastatin  20 mg Oral Daily  . sodium chloride flush  3 mL Intravenous Q12H   Continuous Infusions: . sodium chloride 75 mL/hr at 08/31/18 0352  . sodium chloride    . sodium chloride 3 mL/kg/hr (08/31/18 1022)   Followed by  . sodium chloride 1 mL/kg/hr (08/31/18 1021)  . heparin Stopped (08/31/18 1030)     LOS: 0 days   Time spent= 35 mins    Jhonnie Aliano Arsenio Loader, MD [facility]  If 7PM-7AM, please contact night-coverage [email or web address] 08/31/2018, 10:40 AM

## 2018-08-31 NOTE — Telephone Encounter (Signed)
   TELEPHONE CALL NOTE - RESCHEDULING OPV CANCELLATIONS DUE TO COVID-19  Primary Cardiologist:Bridgette Harrell Gave, MD  Due to the recent COVID-19 pandemic, we are transitioning in-person office visits to tele-medicine visits in an effort to decrease non-essential exposure to our patients, their families, and our staff. This patient has been deemed a candidate to change their previously scheduled office visit to virtual visit.   Please schedule this patient for a telehealth visit with Dr. Harrell Gave or an APP on the team in the next 2 weeks.  I have sent instructions for a telehealth visit to the patient's MyChart (dotphrase [email or web address]).    Please call patient:  1. Inform them we will be conducting visit via tele-health, instead of coming to the office physically in person.  2. Please discuss doing telephone versus video visit. If the patient has a smartphone, video visit is preferred method. Please let the patient know what platform the provider will be using (WebEx, doximity, etc).   3. When contact is confirmed, please change appointment type to Virtual Office Visit and indicate appointment type in notes.  4. Please review consent at time of this phone call when contact is established or 2-3 days prior to visit (dotphrase is [email or web address]).   This phone note will be forwarded to the appropriate scheduling pool and the primary cardiologist's assist. I will remove this patient from the C19 cancellation pool.    [name] Lin Demond Shallenberger, PA 08/31/2018, 11:48 AM

## 2018-08-31 NOTE — Discharge Instructions (Addendum)
Post-cath instructions: No driving for 2 days. No lifting over 5 lbs for 1 week. No sexual activity for 1 week. You may return to work in 1 week. Keep procedure site clean & dry. If you notice increased pain, swelling, bleeding or pus, call/return!  You may shower, but no soaking baths/hot tubs/pools for 1 week.   ______________________________________________________________________________________________   [facility] CARDIOLOGY TEAM HAS ARRANGED FOR AN E-VISIT FOR YOUR APPOINTMENT - PLEASE REVIEW IMPORTANT INFORMATION BELOW SEVERAL DAYS PRIOR TO YOUR APPOINTMENT  Due to the recent COVID-19 pandemic, we are transitioning in-person office visits to tele-medicine visits in an effort to decrease unnecessary exposure to our patients, their families, and staff. Medicare and most insurances are covering these visits without a copay needed. We also encourage you to sign up for MyChart if you have not already done so. You will need a smartphone if possible. For patients that do not have this, we can still complete the visit using a regular telephone but do prefer a smartphone to enable video when possible. You may have a family member that lives with you that can help. If possible, we also ask that you have a blood pressure cuff and scale at home to measure your blood pressure, heart rate and weight prior to your scheduled appointment. Patients with clinical needs that need an in-person evaluation and testing will still be able to come to the office if absolutely necessary. If you have any questions, feel free to call our office.    2-3 DAYS BEFORE YOUR APPOINTMENT  You will receive a telephone call from one of our [place] team members - your caller ID may say "Unknown caller." If this is a video visit, we will walk you through how to set up your device to be able to complete the visit. We will remind you check your blood pressure, heart rate and weight prior to your scheduled appointment. If you have an Apple  Watch or Kardia, please upload any pertinent ECG strips the day before or morning of your appointment to [facility]. Our staff will also make sure you have reviewed the consent and agree to move forward with your scheduled tele-health visit.     THE DAY OF YOUR APPOINTMENT  Approximately 15 minutes prior to your scheduled appointment, you will receive a telephone call from one of [facility] team - your caller ID may say "Unknown caller."  Our staff will confirm medications, vital signs for the day and any symptoms you may be experiencing. Please have this information available prior to the time of visit start. It may also be helpful for you to have a pad of paper and pen handy for any instructions given during your visit. They will also walk you through joining the smartphone meeting if this is a video visit.    CONSENT FOR TELE-HEALTH VISIT - PLEASE REVIEW  I hereby voluntarily request, consent and authorize [facility] and its employed or contracted physicians, physician assistants, nurse practitioners or other licensed health care professionals (the Practitioner), to provide me with telemedicine health care services (the Services") as deemed necessary by the treating Practitioner. I acknowledge and consent to receive the Services by the Practitioner via telemedicine. I understand that the telemedicine visit will involve communicating with the Practitioner through live audiovisual communication technology and the disclosure of certain medical information by electronic transmission. I acknowledge that I have been given the opportunity to request an in-person assessment or other available alternative prior to the telemedicine visit and am voluntarily participating in  the telemedicine visit.  I understand that I have the right to withhold or withdraw my consent to the use of telemedicine in the course of my care at any time, without affecting my right to future care or treatment, and that the  Practitioner or I may terminate the telemedicine visit at any time. I understand that I have the right to inspect all information obtained and/or recorded in the course of the telemedicine visit and may receive copies of available information for a reasonable fee.  I understand that some of the potential risks of receiving the Services via telemedicine include:   Delay or interruption in medical evaluation due to technological equipment failure or disruption;  Information transmitted may not be sufficient (e.g. poor resolution of images) to allow for appropriate medical decision making by the Practitioner; and/or   In rare instances, security protocols could fail, causing a breach of personal health information.  Furthermore, I acknowledge that it is my responsibility to provide information about my medical history, conditions and care that is complete and accurate to the best of my ability. I acknowledge that Practitioner's advice, recommendations, and/or decision may be based on factors not within their control, such as incomplete or inaccurate data provided by me or distortions of diagnostic images or specimens that may result from electronic transmissions. I understand that the practice of medicine is not an exact science and that Practitioner makes no warranties or guarantees regarding treatment outcomes. I acknowledge that I will receive a copy of this consent concurrently upon execution via email to the email address I last provided but may also request a printed copy by calling the office of [facility].    I understand that my insurance will be billed for this visit.   I have read or had this consent read to me.  I understand the contents of this consent, which adequately explains the benefits and risks of the Services being provided via telemedicine.   I have been provided ample opportunity to ask questions regarding this consent and the Services and have had my questions answered to my  satisfaction.  I give my informed consent for the services to be provided through the use of telemedicine in my medical care  By participating in this telemedicine visit I agree to the above.

## 2018-08-31 NOTE — Plan of Care (Signed)

## 2018-08-31 NOTE — Progress Notes (Signed)
Patient educated on importance of not bending or flexing right arm and educated on right radial site care. [name] Chew, MD told pt he could drive himself home. Patient understands medication and site care and has no further questions. Right radial site clean and dry.

## 2018-08-31 NOTE — Progress Notes (Signed)
[facility] for heparin Indication: chest pain/ACS  Allergies  Allergen Reactions  . Shellfish-Derived Products Anaphylaxis, Swelling and Other (See Comments)    Large amounts of crab    Patient Measurements: Height: 5\' 11"  (180.3 cm) Weight: 195 lb 14.4 oz (88.9 kg) IBW/kg (Calculated) : 75.3 Heparin Dosing Weight: 87.1kg  Vital Signs: Temp: 97.8 F (36.6 C) (04/15 0825) Temp Source: Oral (04/15 0825) BP: 140/80 (04/15 0915) Pulse Rate: 69 (04/15 0825)  Labs: Recent Labs    08/30/18 1155 08/30/18 1532 08/30/18 2031 08/31/18 0248  HGB 14.9  --   --  14.1  HCT 45.7  --   --  42.3  PLT 233  --   --  190  HEPARINUNFRC  --   --  0.34 0.44  CREATININE 0.93  --   --   --   TROPONINI <0.03 <0.03 <0.03 <0.03    Estimated Creatinine Clearance: 93.3 mL/min (by C-G formula based on SCr of 0.93 mg/dL).   Medical History: Past Medical History:  Diagnosis Date  . ADENOCARCINOMA, SIGMOID COLON    a. s/p resection/chemo  . Allergy    SEASONAL  . Anginal pain ([place])   . BARRETT'S ESOPHAGUS, HX OF   . CAD    a. 2007: DES to LAD and Diag;  b. 08/2011 non-ischemic myoview, EF 60%;  c. 08/2012 NSTEMI/Cath/PCI: LM n, LAD patent stent, D1 small, 80, D2 90 ISR (PTCA), LCX nl, RCA 20d, RPL 95 (2.25x12 Promus Premier DES), EF 55-65%.  . Diverticulosis   . DYSLIPIDEMIA   . GERD (gastroesophageal reflux disease)   . HIATAL HERNIA WITH REFLUX   . Hyperplastic colon polyp   . HYPERTENSION   . Myocardial infarction ([place])   . 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 s/p wedge resection  . SLEEP APNEA   . Sleep apnea     Medications:  Infusions:  . sodium chloride 75 mL/hr at 08/31/18 0352  . sodium chloride     Followed by  . sodium chloride    . heparin 1,100 Units/hr (08/31/18 0916)    Assessment: 70 yom presented to the ED with CP. To start IV heparin for anticoagulation. Baseline CBC is WNL and he is not on anticoagulation PTA.    Second  heparin level remains goal on 1100 units/hr. No bleeding issues noted.CBC remains wnl/stable. Continue at current rate.   Goal of Therapy:  Heparin level 0.3-0.7 units/ml Monitor platelets by anticoagulation protocol: Yes   Plan:  Continue Heparin gtt 1100 units/hr Daily heparin level and CBC F/u post cardiac cath  Nicole Cella, RPh Clinical Pharmacist [phone] Please check AMION for all [facility] phone numbers After 10:00 PM, call [facility] [phone] 08/31/2018 9:43 AM

## 2018-08-31 NOTE — Discharge Summary (Signed)
Physician Discharge Summary  [name] [name] QMV:[id number] DOB: 1960-11-21 DOA: 08/30/2018  PCP: Tobe Sos, MD  Admit date: 08/30/2018 Discharge date: 08/31/2018  Admitted From: Home Disposition: Home  Recommendations for Outpatient Follow-up:  1. Follow up with PCP in 1-2 weeks 2. Please obtain BMP/CBC in one week your next doctors visit.  3. Crestor increased to 20 mg 4. Imdur 15mg  po daily added 5. Follow-up outpatient with cardiology  Discharge Condition: Stable CODE STATUS: Full code Diet recommendation: Cardiac/2 g salt diet  Brief/Interim Summary: 58 year old with a history of CAD status post PCI/drug-eluting stent to LAD and diagonal in 2007, essential hypertension, hyperlipidemia, GERD, colon cancer status post resection/chemo came to the hospital with complains of palpitations and chest pain.Given his presentation for unstable angina, he was started on heparin drip.  Cardiac enzymes remain negative.  Cardiology team was consulted who recommended left heart catheterization.  This showed widely patent LAD diagonal and PLA stents with normal ejection fraction.  LDL was noted to be 65, hemoglobin A1c 5.5.  Cardiology recommended increasing Crestor to 20 mg daily and adding Imdur 15 mg daily. Patient is currently symptom free and stable for discharge.   Discharge Diagnoses:  Principal Problem:   Chest pain Active Problems:   ADENOCARCINOMA, SIGMOID COLON   Hypertension, essential   Coronary atherosclerosis of native coronary artery   GERD   HLD (hyperlipidemia)  Unstable angina, resolved History of coronary artery disease status post PCI to LAD and PLA - Status post left heart catheterization on 4/15 showing patent previous stent with normal ejection fraction.  Cardiology recommended continuing home medications including aspirin, Plavix and beta-blocker.  Increase Crestor to 20 mg daily.  Add Imdur 15 mg daily.  Follow-up outpatient.  Hyperlipidemia   -Crestor  increased to 20 mg daily.  LDL 65  Essential hypertension -Resume home meds  GERD -PPI  On heparin drip during hospitalization will be stopped at this time Full code Stable for discharge  Consultations:  Cardiology  Subjective: Chest pain-free after the procedure.  Wishes to go home.  Discharge Exam: Vitals:   08/31/18 1200 08/31/18 1215  BP: 129/89 126/82  Pulse:  81  Resp:    Temp:  98.4 F (36.9 C)  SpO2: 100% 100%   Vitals:   08/31/18 1125 08/31/18 1145 08/31/18 1200 08/31/18 1215  BP: 140/88 140/82 129/89 126/82  Pulse: 86   81  Resp: 16     Temp:    98.4 F (36.9 C)  TempSrc:    Oral  SpO2: 100% 100% 100% 100%  Weight:      Height:        General: Pt is alert, awake, not in acute distress Cardiovascular: RRR, S1/S2 +, no rubs, no gallops Respiratory: CTA bilaterally, no wheezing, no rhonchi Abdominal: Soft, NT, ND, bowel sounds + Extremities: no edema, no cyanosis  Discharge Instructions   Allergies as of 08/31/2018      Reactions   Shellfish-derived Products Anaphylaxis, Swelling, Other (See Comments)   Large amounts of crab      Medication List    TAKE these medications   amLODipine 5 MG tablet Commonly known as:  NORVASC Take 5 mg by mouth 2 (two) times daily.   aspirin EC 81 MG tablet Take 1 tablet (81 mg total) by mouth daily.   Claritin 10 MG tablet Generic drug:  loratadine Take 10 mg by mouth daily as needed for allergies.   clopidogrel 75 MG tablet Commonly known as:  PLAVIX Take  1 tablet (75 mg total) by mouth daily.   cyanocobalamin 1000 MCG/ML injection Commonly known as:  (VITAMIN B-12) Inject 1,000 mcg into the muscle every 30 (thirty) days.   Dexilant 60 MG capsule Generic drug:  dexlansoprazole Take 60 mg by mouth daily.   enalapril 10 MG tablet Commonly known as:  VASOTEC Take 10 mg by mouth 2 (two) times daily.   ezetimibe 10 MG tablet Commonly known as:  ZETIA Take 10 mg by mouth daily.   fexofenadine  180 MG tablet Commonly known as:  ALLEGRA Take 180 mg by mouth daily as needed for allergies or rhinitis.   ibuprofen 800 MG tablet Commonly known as:  ADVIL,MOTRIN Take 800 mg by mouth every 8 (eight) hours as needed for headache or moderate pain.   isosorbide mononitrate 30 MG 24 hr tablet Commonly known as:  IMDUR Take 0.5 tablets (15 mg total) by mouth daily for 30 days.   metoprolol succinate 25 MG 24 hr tablet Commonly known as:  TOPROL-XL Take 25 mg by mouth every evening.   montelukast 10 MG tablet Commonly known as:  SINGULAIR Take 10 mg by mouth daily as needed (for allergies).   MULTIVITAMIN PO Take 1 tablet by mouth daily.   omega-3 acid ethyl esters 1 g capsule Commonly known as:  LOVAZA Take 2 g by mouth 2 (two) times daily.   oxyCODONE 5 MG immediate release tablet Commonly known as:  Oxy IR/ROXICODONE Take 1-2 tablets (5-10 mg total) by mouth every 6 (six) hours as needed for moderate pain, severe pain or breakthrough pain.   rosuvastatin 20 MG tablet Commonly known as:  CRESTOR Take 1 tablet (20 mg total) by mouth daily for 30 days. What changed:  how much to take   Vitamin D3 50 MCG (2000 UT) capsule Take 2,000 Units by mouth daily.      Follow-up Information    Buford Dresser, MD Follow up in 1 week(s).   Specialty:  Cardiology Why:  Office will call with appt. If you haven't heart from them in 2 days, please call the above number to schedule. Contact information: [address] [place] [address] [place] [address] [phone]          Allergies  Allergen Reactions  . Shellfish-Derived Products Anaphylaxis, Swelling and Other (See Comments)    Large amounts of crab    You were cared for by a hospitalist during your hospital stay. If you have any questions about your discharge medications or the care you received while you were in the hospital after you are discharged, you can call the unit and asked to speak with the hospitalist on  call if the hospitalist that took care of you is not available. Once you are discharged, your primary care physician will handle any further medical issues. Please note that no refills for any discharge medications will be authorized once you are discharged, as it is imperative that you return to your primary care physician (or establish a relationship with a primary care physician if you do not have one) for your aftercare needs so that they can reassess your need for medications and monitor your lab values.   Procedures/Studies: Dg Chest Port 1 View  Result Date: 08/30/2018 CLINICAL DATA:  Pain and tachycardia EXAM: PORTABLE CHEST 1 VIEW COMPARISON:  March 16, 2016 FINDINGS: No edema or consolidation. Heart size and pulmonary vascularity are normal. Stents are noted pulmonary arteries. There is aortic atherosclerosis. No adenopathy. No bone lesions. No pneumothorax. IMPRESSION: No  edema or consolidation. Stable cardiac silhouette. Aortic Atherosclerosis (ICD10-I70.0). Electronically Signed   By: Lowella Grip III M.D.   On: 08/30/2018 12:01      The results of significant diagnostics from this hospitalization (including imaging, microbiology, ancillary and laboratory) are listed below for reference.     Microbiology: No results found for this or any previous visit (from the past 240 hour(s)).   Labs: BNP (last 3 results) No results for input(s): BNP in the last 8760 hours. Basic Metabolic Panel: Recent Labs  Lab 08/30/18 1155  NA 139  K 3.6  CL 105  CO2 22  GLUCOSE 114*  BUN 27*  CREATININE 0.93  CALCIUM 9.8   Liver Function Tests: No results for input(s): AST, ALT, ALKPHOS, BILITOT, PROT, ALBUMIN in the last 168 hours. No results for input(s): LIPASE, AMYLASE in the last 168 hours. No results for input(s): AMMONIA in the last 168 hours. CBC: Recent Labs  Lab 08/30/18 1155 08/31/18 0248  WBC 8.9 7.5  NEUTROABS 6.4  --   HGB 14.9 14.1  HCT 45.7 42.3  MCV 90.0 88.7   PLT 233 190   Cardiac Enzymes: Recent Labs  Lab 08/30/18 1155 08/30/18 1532 08/30/18 2031 08/31/18 0248  TROPONINI <0.03 <0.03 <0.03 <0.03   BNP: Invalid input(s): POCBNP CBG: No results for input(s): GLUCAP in the last 168 hours. D-Dimer No results for input(s): DDIMER in the last 72 hours. Hgb A1c Recent Labs    08/31/18 0925  HGBA1C 5.5   Lipid Profile Recent Labs    08/31/18 0925  CHOL 123  HDL 39*  LDLCALC 65  TRIG 93  CHOLHDL 3.2   Thyroid function studies No results for input(s): TSH, T4TOTAL, T3FREE, THYROIDAB in the last 72 hours.  Invalid input(s): FREET3 Anemia work up No results for input(s): VITAMINB12, FOLATE, FERRITIN, TIBC, IRON, RETICCTPCT in the last 72 hours. Urinalysis No results found for: COLORURINE, APPEARANCEUR, LABSPEC, [place], GLUCOSEU, HGBUR, BILIRUBINUR, KETONESUR, PROTEINUR, UROBILINOGEN, NITRITE, LEUKOCYTESUR Sepsis Labs Invalid input(s): PROCALCITONIN,  WBC,  LACTICIDVEN Microbiology No results found for this or any previous visit (from the past 240 hour(s)).   Time coordinating discharge:  I have spent 35 minutes face to face with the patient and on the ward discussing the patients care, assessment, plan and disposition with other care givers. >50% of the time was devoted counseling the patient about the risks and benefits of treatment/Discharge disposition and coordinating care.   SIGNED:   Damita Lack, MD  [facility] 08/31/2018, 12:42 PM   If 7PM-7AM, please contact night-coverage [email or web address]

## 2018-09-08 ENCOUNTER — Telehealth: Payer: Self-pay | Admitting: Cardiology

## 2018-09-08 NOTE — Telephone Encounter (Signed)
Spoke with pt who states he walked up a flight of stairs the other day and heart rate increased to 110. He report he had no symptoms of chest pain or SOB at the time, but concerned, as his heart rate would normally go back down but has been staying around 100's. He report right now riding down the road, HR is 80.  Pt state his pcp is inquiring if he should change amlodipine to Cardizem and increase Metoprolol to 37.5. Pt voiced the only concern is his heart rate tends to drop to 44-46 at night. He also questioning as to whether he should take his metoprolol in the day time instead of at night since his HR tends to be higher during the day. Will route to MD

## 2018-09-08 NOTE — Telephone Encounter (Signed)
° ° °  Patient's spouse calling to report HR and tightness in the back of the neck  STAT if HR is under 50 or over 120 (normal HR is 60-100 beats per minute)  1) What is your heart rate? Over 100 per spouse  Do you have a log of your heart rate readings (document readings)? n/a 2) Do you have any other symptoms? Sore neck

## 2018-09-09 NOTE — Telephone Encounter (Signed)
Pt updated with MD's recommendation and voiced understanding.

## 2018-09-09 NOTE — Telephone Encounter (Signed)
It is normal for heart rate to rise, and if the amount of physical activity he has gotten recently has decreased because of social distancing, his conditioning level might have changed. Cardizem does not have the same blood pressure control that amlodipine does, and with rates in the 40s I would not increase any heart rate control medicine. It is safe for people's heart rate to get to 22-age beats per minute with exercise. I am fine with taking the metoprolol in the morning, but I would not make any other changes. His resting heart rate in the 80s is fine. If he can gradually increase his physical activity, this will lower his resting heart rate over time. Thanks.

## 2018-09-13 NOTE — Telephone Encounter (Signed)
Smartphone/ consent/ my chart/ pre reg complete

## 2018-09-14 ENCOUNTER — Telehealth (INDEPENDENT_AMBULATORY_CARE_PROVIDER_SITE_OTHER): Payer: BLUE CROSS/BLUE SHIELD | Admitting: Adult Health

## 2018-09-14 DIAGNOSIS — I2510 Atherosclerotic heart disease of native coronary artery without angina pectoris: Secondary | ICD-10-CM | POA: Diagnosis not present

## 2018-09-14 DIAGNOSIS — I119 Hypertensive heart disease without heart failure: Secondary | ICD-10-CM

## 2018-09-14 DIAGNOSIS — E7800 Pure hypercholesterolemia, unspecified: Secondary | ICD-10-CM

## 2018-09-14 DIAGNOSIS — I10 Essential (primary) hypertension: Secondary | ICD-10-CM

## 2018-09-14 NOTE — Patient Instructions (Addendum)
Follow-Up: You will need a follow up appointment in 4-6 months.  Please call our office 2 months in advance to schedule this appointment.  You may see Buford Dresser, MD Jory Sims, DNP, AACC  or one of the following Advanced Practice Providers on your designated Care Team:  Jory Sims, DNP, ANP  Rosaria Ferries, PA-C        Medication Instructions:  NO CHANGES- Your physician recommends that you continue on your current medications as directed. Please refer to the Current Medication list given to you today. If you need a refill on your cardiac medications before your next appointment, please call your pharmacy. Labwork: When you have labs (blood work) and your tests are completely normal, you will receive your results ONLY by [facility] (if you have MyChart) -OR- A paper copy in the mail.  At [facility], you and your health needs are our priority.  As part of our continuing mission to provide you with exceptional heart care, we have created designated Provider Care Teams.  These Care Teams include your primary Cardiologist (physician) and Advanced Practice Providers (APPs -  Physician Assistants and Nurse Practitioners) who all work together to provide you with the care you need, when you need it.  Thank you for choosing [facility]!!

## 2018-09-14 NOTE — Progress Notes (Signed)
Virtual Visit via Telephone Note   This visit type was conducted due to national recommendations for restrictions regarding the COVID-19 Pandemic (e.g. social distancing) in an effort to limit this patient's exposure and mitigate transmission in our community.  Due to his co-morbid illnesses, this patient is at least at moderate risk for complications without adequate follow up.  This format is felt to be most appropriate for this patient at this time.  The patient did not have access to video technology/had technical difficulties with video requiring transitioning to audio format only (telephone).  All issues noted in this document were discussed and addressed.  No physical exam could be performed with this format.  Please refer to the patient's chart for his  consent to telehealth for [facility].  Patient has given verbal permission to conduct this visit via virtual appointment and to bill insurance 09/14/2018 11:04 AM     Evaluation Performed:  Follow-up visit  Date:  09/14/2018   ID:  BRACE WELTE, DOB 1960/09/09, MRN [id number]  Patient Location: Home Provider Location: Home  PCP:  Tobe Sos, MD  Cardiologist:  Buford Dresser, MD  Electrophysiologist:  None   Chief Complaint:  Hospital follow up   History of Present Illness:    [name] [name] is a 58 y.o. male with ongoing assessment and management of coronary artery disease with known history of CAD status post PCI with drug-eluting stent to the LAD and diagonal in 2007, hypertension, hyperlipidemia, GERD, history of colon cancer status post resection with chemotherapy.  We are seeing him post hospitalization after he came to the emergency room with complaints of palpitations and chest pain.  Due to his presentation concerning for unstable angina he was started on a heparin drip, cardiac enzymes were cycled and found to be negative, however he was planned for cardiac catheterization for diagnostic/  prognostic purposes.  Cardiac catheterization completed on 08/31/2018 revealed widely patent stents to the LAD and PLA , with normal ejection fraction and EDP.  He was continued on current medication regimen with addition of Imdur 15 mg daily and increasing Crestor to 20 mg daily.  He called our office on 09/09/2018 with complaints of elevated heart rate while climbing stairs.  He inquired whether he should change amlodipine to diltiazem and increase his metoprolol.  But he also reported that his heart rate tends to drop in the 40s at night.  He questioned whether to take his metoprolol in the daytime or in the evening time since his heart rate tends to be higher during the day.  Dr. Harrell Gave responded by stating that she did not wish to start diltiazem, as it does not have the same blood pressure control that amlodipine does, and with bradycardia increasing heart rate control medicine would not be safe.  She was okay with taking metoprolol in the morning.  She felt that some of his elevated heart rate had to do with deconditioning due to inactivity in light of COVID 19 quarantine recommendations for our community.  He reports that he is feeling much better after following Dr.Christopher's instructions. He is less tired when he taken the metoprolol in the morning instead of at night. He continues to work Physicist, medical. He cannot tolerate isosorbide. The headaches are severe and he stopped taking it.   The patient does not have symptoms concerning for COVID-19 infection (fever, chills, cough, or new shortness of breath).    Past Medical History:  Diagnosis Date  .  ADENOCARCINOMA, SIGMOID COLON    a. s/p resection/chemo  . Allergy    SEASONAL  . Anginal pain ([place])   . BARRETT'S ESOPHAGUS, HX OF   . CAD    a. 2007: DES to LAD and Diag;  b. 08/2011 non-ischemic myoview, EF 60%;  c. 08/2012 NSTEMI/Cath/PCI: LM n, LAD patent stent, D1 small, 80, D2 90 ISR (PTCA), LCX nl,  RCA 20d, RPL 95 (2.25x12 Promus Premier DES), EF 55-65%.  . Diverticulosis   . DYSLIPIDEMIA   . GERD (gastroesophageal reflux disease)   . HIATAL HERNIA WITH REFLUX   . Hyperplastic colon polyp   . HYPERTENSION   . Myocardial infarction ([place])   . 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 s/p wedge resection  . SLEEP APNEA   . Sleep apnea    Past Surgical History:  Procedure Laterality Date  . COLON RESECTION  1998  . COLON SURGERY    . CORONARY STENT PLACEMENT  2007   taxus  . INGUINAL HERNIA REPAIR N/A 05/31/2017   Procedure: BILATERAL LAPAROSCOPIC INGUINAL HERNIA REPAIR WITH MESH;  Surgeon: Coralie Keens, MD;  Location: [place] [facility];  Service: General;  Laterality: N/A;  . INSERTION OF MESH Bilateral 05/31/2017   Procedure: INSERTION OF MESH;  Surgeon: Coralie Keens, MD;  Location: [employer];  Service: General;  Laterality: Bilateral;  . KNEE SURGERY    . LEFT HEART CATH AND CORONARY ANGIOGRAPHY N/A 08/31/2018   Procedure: LEFT HEART CATH AND CORONARY ANGIOGRAPHY;  Surgeon: Leonie Man, MD;  Location: [place] CV LAB;  Service: Cardiovascular;  Laterality: N/A;  . LEFT HEART CATHETERIZATION WITH CORONARY ANGIOGRAM N/A 08/22/2012   Procedure: LEFT HEART CATHETERIZATION WITH CORONARY ANGIOGRAM;  Surgeon: Peter M Martinique, MD;  Location: [facility] CATH LAB;  Service: Cardiovascular;  Laterality: N/A;  . LEFT HEART CATHETERIZATION WITH CORONARY ANGIOGRAM N/A 09/12/2013   Procedure: LEFT HEART CATHETERIZATION WITH CORONARY ANGIOGRAM;  Surgeon: Troy Sine, MD;  [facility] C[facility] LAB;  Service: Cardiovascular;  Laterality: N/A;  . LUNG REMOVAL, PARTIAL     LEFT  . TONSILLECTOMY       Current Meds  Medication Sig  . amLODipine (NORVASC) 5 MG tablet Take 5 mg by mouth 2 (two) times daily.   Marland Kitchen aspirin EC 81 MG tablet Take 1 tablet (81 mg total) by mouth daily.  . Cholecalciferol (VITAMIN D3) 2000 UNITS capsule Take 2,000 Units by mouth daily.   . clopidogrel (PLAVIX) 75 MG tablet  Take 1 tablet (75 mg total) by mouth daily.  . cyanocobalamin (,VITAMIN B-12,) 1000 MCG/ML injection Inject 1,000 mcg into the muscle every 30 (thirty) days.   Marland Kitchen DEXILANT 60 MG capsule Take 60 mg by mouth daily.   . enalapril (VASOTEC) 10 MG tablet Take 10 mg by mouth 2 (two) times daily.  Marland Kitchen ezetimibe (ZETIA) 10 MG tablet Take 10 mg by mouth daily.  . fexofenadine (ALLEGRA) 180 MG tablet Take 180 mg by mouth daily as needed for allergies or rhinitis.  Marland Kitchen ibuprofen (ADVIL,MOTRIN) 800 MG tablet Take 800 mg by mouth every 8 (eight) hours as needed for headache or moderate pain.  Marland Kitchen loratadine (CLARITIN) 10 MG tablet Take 10 mg by mouth daily as needed for allergies.   . metoprolol succinate (TOPROL-XL) 25 MG 24 hr tablet Take 25 mg by mouth every evening.   . montelukast (SINGULAIR) 10 MG tablet Take 10 mg by mouth daily as needed (for allergies).  . Multiple Vitamins-Minerals (MULTIVITAMIN PO) Take 1  tablet by mouth daily.  Marland Kitchen omega-3 acid ethyl esters (LOVAZA) 1 G capsule Take 2 g by mouth 2 (two) times daily.   Marland Kitchen oxyCODONE (OXY IR/ROXICODONE) 5 MG immediate release tablet Take 1-2 tablets (5-10 mg total) by mouth every 6 (six) hours as needed for moderate pain, severe pain or breakthrough pain.  . rosuvastatin (CRESTOR) 20 MG tablet Take 1 tablet (20 mg total) by mouth daily for 30 days.  . [DISCONTINUED] isosorbide mononitrate (IMDUR) 30 MG 24 hr tablet Take 0.5 tablets (15 mg total) by mouth daily for 30 days.     Allergies:   Shellfish-derived products and Isosorbide   Social History   Tobacco Use  . Smoking status: Never Smoker  . Smokeless tobacco: Never Used  Substance Use Topics  . Alcohol use: No  . Drug use: No     Family Hx: The patient's family history includes Colon polyps in his mother; Heart attack in his paternal uncle and paternal uncle; Heart disease in his father and mother. There is no history of Colon cancer.  ROS:   Please see the history of present illness.    All  other systems reviewed and are negative.   Prior CV studies:   The following studies were reviewed today: Cardiac Cath 08/31/2018     The left ventricular systolic function is normal. The left ventricular ejection fraction is 55-65% by visual estimate.  LV end diastolic pressure is normal.  ----------------------------------------------  -- Previously placed Prox LAD to Mid LAD DES stent is widely patent.  -- Previously placed Ost 1st Diag-2 DES stent is 5% stenosed.  -- Previously placed Post Atrio DES stent is widely patent.   SUMMARY  Widely patent LAD-diagonal and PLA stents.  Otherwise minimal CAD.  Normal EF and EDP.  RECOMMENDATIONS  Considered non-anginal reason for chest pain.  Continue risk factor modification  Anticipate discharge later today.      Labs/Other Tests and Data Reviewed:    EKG:  No ECG reviewed.  Recent Labs: 08/30/2018: BUN 27; Creatinine, Ser 0.93; Potassium 3.6; Sodium 139 08/31/2018: Hemoglobin 14.1; Platelets 190   Recent Lipid Panel Lab Results  Component Value Date/Time   CHOL 123 08/31/2018 09:25 AM   TRIG 93 08/31/2018 09:25 AM   HDL 39 (L) 08/31/2018 09:25 AM   CHOLHDL 3.2 08/31/2018 09:25 AM   LDLCALC 65 08/31/2018 09:25 AM    Wt Readings from Last 3 Encounters:  08/31/18 195 lb 14.4 oz (88.9 kg)  03/25/18 197 lb 9.6 oz (89.6 kg)  05/27/17 197 lb 9.6 oz (89.6 kg)     Objective:    Vital Signs:  There were no vitals taken for this visit. He does not have access to blood pressure cuff.   Limited due to telephone visit. Appeared awake, alert and oriented.  Answered and asked questions appropriately.  Described his overall status.   ASSESSMENT & PLAN:    1. CAD: Hx of stents to LAD and PLA. Repeat cardiac cath on 08/31/2018 did not reveal any new stenosis or instent stenosis. She was placed on isosorbide but was unable to tolerate this due to very sever headaches and stopped this on his own. He has has not had  any recurrence of chest pain.   2. Hypercholestrrolemia: He is now on higher dose of Crestor at 20 mg daily. Labs are completed by PCP.   3. Hypertension: Do not have reading today as he does not have access to BP monitor. He will check periodically at  pharmacy if available after COVID pandemic restrictions are lifted. Controlled while hospitalized.    COVID-19 Education: The signs and symptoms of COVID-19 were discussed with the patient and how to seek care for testing (follow up with PCP or arrange E-visit).  The importance of social distancing was discussed today.  Time:   Today, I have spent 10 minutes with the patient with telehealth technology discussing the above problems.     Medication Adjustments/Labs and Tests Ordered: Current medicines are reviewed at length with the patient today.  Concerns regarding medicines are outlined above.   Tests Ordered: No orders of the defined types were placed in this encounter.   Medication Changes: No orders of the defined types were placed in this encounter.   Disposition:  Follow up 4-6 months   Signed, Phill Myron. [place] Pugh, ANP, AACC  09/14/2018 11:02 AM      [facility]

## 2019-04-06 ENCOUNTER — Telehealth: Payer: Self-pay | Admitting: Cardiology

## 2019-04-06 NOTE — Telephone Encounter (Signed)
APPT  Scheduled for 05/30/19 at 1:20.

## 2019-04-06 NOTE — Telephone Encounter (Signed)
I am happy to see him

## 2019-04-06 NOTE — Telephone Encounter (Signed)

## 2019-04-06 NOTE — Telephone Encounter (Signed)
Patient wanted to start going to Dr. Radford Pax for his CPAP supplies. He is currently a patient of Dr. Harrell Gave. He has not seen his provider that ordered his cpap in over 5 years, and would not know if he would be able to get a referral from them. He was hoping Dr. Harrell Gave would send a referral for him to see Dr. Radford Pax for cpap supplies.   His wife, [name] [name] is already an established patient of Dr. Radford Pax for her cpap supplies.

## 2019-05-29 ENCOUNTER — Telehealth: Payer: Self-pay | Admitting: *Deleted

## 2019-05-29 NOTE — Telephone Encounter (Signed)
-----   Message from Sueanne Margarita, MD sent at 05/18/2019  6:14 PM EST ----- Good AHI on PAP but needs to improve compliance

## 2019-05-29 NOTE — Telephone Encounter (Signed)
Informed patient of compliance results and verbalized understanding was indicated. Patient is aware and agreeable to AHI being within range at 0.7. Patient is aware and agreeable to improving in compliance with machine usage.

## 2019-05-30 ENCOUNTER — Other Ambulatory Visit: Payer: Self-pay

## 2019-05-30 ENCOUNTER — Telehealth: Payer: Self-pay | Admitting: *Deleted

## 2019-05-30 ENCOUNTER — Encounter: Payer: Self-pay | Admitting: Cardiology

## 2019-05-30 ENCOUNTER — Telehealth (INDEPENDENT_AMBULATORY_CARE_PROVIDER_SITE_OTHER): Payer: BC Managed Care – PPO | Admitting: Cardiology

## 2019-05-30 VITALS — HR 58 | Ht 71.0 in | Wt 191.2 lb

## 2019-05-30 DIAGNOSIS — G4733 Obstructive sleep apnea (adult) (pediatric): Secondary | ICD-10-CM | POA: Diagnosis not present

## 2019-05-30 DIAGNOSIS — I10 Essential (primary) hypertension: Secondary | ICD-10-CM | POA: Diagnosis not present

## 2019-05-30 NOTE — Telephone Encounter (Addendum)
Order placed to [facility] , phone number [phone]  Fax # [phone]  1 year follow up appt made.

## 2019-05-30 NOTE — Telephone Encounter (Signed)
  Sueanne Margarita, MD  Freada Bergeron, CMA  Please order regular PAP supplies for his CPAP that he uses at home as well including a new mask. Followup with me in 1 year

## 2019-05-30 NOTE — Telephone Encounter (Signed)
-----   Message from Sueanne Margarita, MD sent at 05/30/2019  1:04 PM EST ----- Order a travel CPAP at 8cm H2O with heated humidity and mask of choice through [facility] in [place], [place]

## 2019-05-30 NOTE — Progress Notes (Signed)
Virtual Visit via Telephone Note   This visit type was conducted due to national recommendations for restrictions regarding the COVID-19 Pandemic (e.g. social distancing) in an effort to limit this patient's exposure and mitigate transmission in our community.  Due to his co-morbid illnesses, this patient is at least at moderate risk for complications without adequate follow up.  This format is felt to be most appropriate for this patient at this time.  All issues noted in this document were discussed and addressed.  A limited physical exam was performed with this format.  Please refer to the patient's chart for his consent to telehealth for [facility].   Evaluation Performed:  Follow-up visit  This visit type was conducted due to national recommendations for restrictions regarding the COVID-19 Pandemic (e.g. social distancing).  This format is felt to be most appropriate for this patient at this time.  All issues noted in this document were discussed and addressed.  No physical exam was performed (except for noted visual exam findings with Video Visits).  Please refer to the patient's chart (MyChart message for video visits and phone note for telephone visits) for the patient's consent to telehealth for [name].  Date:  05/30/2019   ID:  [name] [name], DOB 01/07/1961, MRN [id number]  Patient Location:  Home  Provider location:   [place]  PCP:  Tobe Sos, MD  Cardiologist:  Buford Dresser, MD  Sleep Medicine:  Fransico Him, MD Electrophysiologist:  None   Chief Complaint:  OSA  History of Present Illness:    [name] [name] is a 59 y.o. male who presents via audio/video conferencing for a telehealth visit today.    This is a 59yo male with a hx of OSA on CPAP, ASCAD, HLD and HTN.  He has been followed by an MD in [place] and wanted to move his care.  He has been on CPAP for about 13 years. He is doing well with his CPAP device.  He tolerates the  nasal pillow mask and feels the pressure is adequate. When he uses his device he feels rested in the am and has no significant daytime sleepiness.  He travel device is old and does not have a water chamber and he needs a new device. 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 He does not think that he snores.    The patient does not have symptoms concerning for COVID-19 infection (fever, chills, cough, or new shortness of breath).   Prior CV studies:   The following studies were reviewed today:  PAP compliance  Past Medical History:  Diagnosis Date  . ADENOCARCINOMA, SIGMOID COLON    a. s/p resection/chemo  . Allergy    SEASONAL  . Anginal pain ([place])   . BARRETT'S ESOPHAGUS, HX OF   . CAD    a. 2007: DES to LAD and Diag;  b. 08/2011 non-ischemic myoview, EF 60%;  c. 08/2012 NSTEMI/Cath/PCI: LM n, LAD patent stent, D1 small, 80, D2 90 ISR (PTCA), LCX nl, RCA 20d, RPL 95 (2.25x12 Promus Premier DES), EF 55-65%.  . Diverticulosis   . DYSLIPIDEMIA   . GERD (gastroesophageal reflux disease)   . HIATAL HERNIA WITH REFLUX   . Hyperplastic colon polyp   . HYPERTENSION   . Myocardial infarction ([place])   . 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 s/p wedge resection  . SLEEP APNEA   . Sleep  apnea    Past Surgical History:  Procedure Laterality Date  . COLON RESECTION  1998  . COLON SURGERY    . CORONARY STENT PLACEMENT  2007   taxus  . INGUINAL HERNIA REPAIR N/A 05/31/2017   Procedure: BILATERAL LAPAROSCOPIC INGUINAL HERNIA REPAIR WITH MESH;  Surgeon: Coralie Keens, MD;  Location: [place];  Service: General;  Laterality: N/A;  . INSERTION OF MESH Bilateral 05/31/2017   Procedure: INSERTION OF MESH;  Surgeon: Coralie Keens, MD;  Location: [facility];  Service: General;  Laterality: Bilateral;  . KNEE SURGERY    . LEFT HEART CATH AND CORONARY ANGIOGRAPHY N/A 08/31/2018   Procedure: LEFT HEART CATH AND CORONARY ANGIOGRAPHY;  Surgeon:  Leonie Man, MD;  Location: [place] CV LAB;  Service: Cardiovascular;  Laterality: N/A;  . LEFT HEART CATHETERIZATION WITH CORONARY ANGIOGRAM N/A 08/22/2012   Procedure: LEFT HEART CATHETERIZATION WITH CORONARY ANGIOGRAM;  Surgeon: Peter M Martinique, MD;  Location: [facility] CATH LAB;  Service: Cardiovascular;  Laterality: N/A;  . LEFT HEART CATHETERIZATION WITH CORONARY ANGIOGRAM N/A 09/12/2013   Procedure: LEFT HEART CATHETERIZATION WITH CORONARY ANGIOGRAM;  Surgeon: Troy Sine, MD;  Location: [facility] CATH LAB;  Service: Cardiovascular;  Laterality: N/A;  . LUNG REMOVAL, PARTIAL     LEFT  . TONSILLECTOMY       Current Meds  Medication Sig  . amLODipine (NORVASC) 5 MG tablet Take 5 mg by mouth 2 (two) times daily.   Marland Kitchen aspirin EC 81 MG tablet Take 1 tablet (81 mg total) by mouth daily.  . Cholecalciferol (VITAMIN D3) 2000 UNITS capsule Take 2,000 Units by mouth daily.   . clopidogrel (PLAVIX) 75 MG tablet Take 1 tablet (75 mg total) by mouth daily.  . cyanocobalamin (,VITAMIN B-12,) 1000 MCG/ML injection Inject 1,000 mcg into the muscle every 30 (thirty) days.   Marland Kitchen DEXILANT 60 MG capsule Take 60 mg by mouth daily.   . enalapril (VASOTEC) 10 MG tablet Take 10 mg by mouth 2 (two) times daily.  Marland Kitchen ezetimibe (ZETIA) 10 MG tablet Take 10 mg by mouth daily.  . fexofenadine (ALLEGRA) 180 MG tablet Take 180 mg by mouth daily as needed for allergies or rhinitis.  Marland Kitchen ibuprofen (ADVIL,MOTRIN) 800 MG tablet Take 800 mg by mouth every 8 (eight) hours as needed for headache or moderate pain.  Marland Kitchen loratadine (CLARITIN) 10 MG tablet Take 10 mg by mouth daily as needed for allergies.   . metoprolol succinate (TOPROL-XL) 25 MG 24 hr tablet Take 25 mg by mouth every evening.   . montelukast (SINGULAIR) 10 MG tablet Take 10 mg by mouth daily as needed (for allergies).  . Multiple Vitamins-Minerals (MULTIVITAMIN PO) Take 1 tablet by mouth daily.  Marland Kitchen omega-3 acid ethyl esters (LOVAZA) 1 G capsule Take 2 g by mouth 2 (two)  times daily.   . rosuvastatin (CRESTOR) 20 MG tablet Take 1 tablet (20 mg total) by mouth daily for 30 days. (Patient taking differently: Take 10 mg by mouth daily. )     Allergies:   Shellfish-derived products and Isosorbide   Social History   Tobacco Use  . Smoking status: Never Smoker  . Smokeless tobacco: Never Used  Substance Use Topics  . Alcohol use: No  . Drug use: No     Family Hx: The patient's family history includes Colon polyps in his mother; Heart attack in his paternal uncle and paternal uncle; Heart disease in his father and mother. There is no history of Colon cancer.  ROS:   Please see the history of present illness.     All other systems reviewed and are negative.   Labs/Other Tests and Data Reviewed:    Recent Labs: 08/30/2018: BUN 27; Creatinine, Ser 0.93; Potassium 3.6; Sodium 139 08/31/2018: Hemoglobin 14.1; Platelets 190   Recent Lipid Panel Lab Results  Component Value Date/Time   CHOL 123 08/31/2018 09:25 AM   TRIG 93 08/31/2018 09:25 AM   HDL 39 (L) 08/31/2018 09:25 AM   CHOLHDL 3.2 08/31/2018 09:25 AM   LDLCALC 65 08/31/2018 09:25 AM    Wt Readings from Last 3 Encounters:  05/30/19 191 lb 3.2 oz (86.7 kg)  08/31/18 195 lb 14.4 oz (88.9 kg)  03/25/18 197 lb 9.6 oz (89.6 kg)     Objective:    Vital Signs:  Pulse (!) 58   Ht 5\' 11"  (1.803 m)   Wt 191 lb 3.2 oz (86.7 kg)   BMI 26.67 kg/m     ASSESSMENT & PLAN:    1.  OSA -  The patient is tolerating PAP therapy well without any problems. The PAP download was reviewed today and showed an AHI of 0.9/hr on 8 cm H2O with 50% compliance in using more than 4 hours nightly.  The patient has been using and benefiting from PAP use and will continue to benefit from therapy.  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   -I will order him a new travel CPAP device with heated humidity and mask of choice and set  on 8cm H2O through [facility] in [place].   2.  HTN -continue Enalapril 10mg  BID, Toprol XL 25mg  daily and amlodipine 5mg  BID -creatinine was 0.93 in April 2020 -followed by PCP   COVID-19 Education: The signs and symptoms of COVID-19 were discussed with the patient and how to seek care for testing (follow up with PCP or arrange E-visit).  The importance of social distancing was discussed today.  Patient Risk:   After full review of this patient's clinical status, I feel that they are at least moderate risk at this time.  Time:   Today, I have spent 20 minutes on telemedicine discussing medical problems including OSA, HTN.  We also reviewed the symptoms of COVID 19 and the ways to protect against contracting the virus with telehealth technology.  I spent an additional 5 minutes reviewing patient's chart including PAP compliance download.  Medication Adjustments/Labs and Tests Ordered: Current medicines are reviewed at length with the patient today.  Concerns regarding medicines are outlined above.  Tests Ordered: No orders of the defined types were placed in this encounter.  Medication Changes: No orders of the defined types were placed in this encounter.   Disposition:  Follow up in 1 year(s)  Signed, Fransico Him, MD  05/30/2019 12:58 PM    [facility]

## 2019-06-12 ENCOUNTER — Telehealth: Payer: Self-pay | Admitting: Cardiology

## 2019-06-12 NOTE — Telephone Encounter (Signed)
1) What problem are you experiencing? The location the patient's CPAP machine was sent over to called the patient's wife to inform her they are going out of business. She states she would like the new request or the machine sent to [facility] at [address], [place] [address] phone number [phone]  2) Who is your medical equipment company?    Please route to the sleep study assistant.

## 2019-06-13 NOTE — Telephone Encounter (Signed)
DME  HAS CHANGED TO [facility].

## 2019-06-13 NOTE — Telephone Encounter (Signed)
Patient DME has changed to [facility] placed to [facility] for regular PAP supplies for his CPAP that he uses at home as well including a new mask.   Order a travel CPAP at 8cm H2O with heated humidity and mask of choice.

## 2019-06-14 NOTE — Telephone Encounter (Signed)
Spoke to [place] at [facility] and she states she will forward the order to [facility] office today.

## 2019-07-28 ENCOUNTER — Telehealth: Payer: Self-pay | Admitting: Cardiology

## 2019-07-28 DIAGNOSIS — G4733 Obstructive sleep apnea (adult) (pediatric): Secondary | ICD-10-CM

## 2019-07-28 NOTE — Telephone Encounter (Signed)
1) What problem are you experiencing? [name] [name] is calling stating they never heard anything back from the CPAP company they tried to get it through and would like to try another location. They would now like to have a new request of the machine sent to [facility], an [place]. The address to the location is [address], [place], [place] [address]. The phone number is [phone].  2) Who is your medical equipment company?    Please route to the sleep study assistant.

## 2019-07-31 NOTE — Telephone Encounter (Signed)
Reached out to the patient to offer help and patient wants to change to [facility], an [place]. The address to the location is [address], [place], [place] [address]. The phone number is [phone]. Patient was agreeable to treatment [facility] notified of order and new referral.

## 2019-07-31 NOTE — Telephone Encounter (Signed)
[facility], an [employer]. The address to the location is [address], [place], [place] [address]. The phone number is [phone].

## 2019-09-25 ENCOUNTER — Ambulatory Visit: Payer: BC Managed Care – PPO | Admitting: Dermatology

## 2019-10-12 NOTE — Telephone Encounter (Signed)
Hervey is calling requesting to speak with Gae Bon due to the [place] advising him they still had not received anything. They advised him to verify she had the correct fax number which is [phone]. Please advise.

## 2019-10-18 NOTE — Telephone Encounter (Signed)
Called patient and called the dme who states the patient was out of the area code they service. Called Sonia Baller at [employer] [facility] and she is getting everything corrected and getting the patient to the corrrect dme. Pt is aware and agreeable to treatment.

## 2019-10-24 NOTE — Telephone Encounter (Signed)
    DME has changed to [facility], an [employer]=DME [place]/@ PHONE# [phone] FAX # [phone]

## 2019-10-24 NOTE — Telephone Encounter (Signed)
RE: new cpap Sueanne Margarita, MD  Freada Bergeron, CMA  That is fine - you can just take the order for the travel CPAP and change to Resmed CPAP with same pressure as iniital order   Traci          ----- Message -----  From: Freada Bergeron, CMA  Sent: 10/23/2019  5:51 PM EDT  To: Sueanne Margarita, MD  Subject: new cpap                     Patient wants a new cpap since his insurance will not pay for a travel unit.  Please advise

## 2019-10-24 NOTE — Telephone Encounter (Signed)
DME has changed to [facility], an [employer]=[facility]/@ PHONE# [phone] FAX # [phone]

## 2019-10-24 NOTE — Addendum Note (Signed)
Addended by: Freada Bergeron on: 10/24/2019 09:03 AM   Modules accepted: Orders

## 2019-11-24 ENCOUNTER — Telehealth: Payer: Self-pay | Admitting: *Deleted

## 2019-11-24 NOTE — Telephone Encounter (Signed)
  Patient Consent for Virtual Visit         [name] [name] has provided verbal consent on 11/24/2019 for a virtual visit (video or telephone).   CONSENT FOR VIRTUAL VISIT FOR:  [name] [name]  By participating in this virtual visit I agree to the following:  I hereby voluntarily request, consent and authorize [facility] and its employed or contracted physicians, physician assistants, nurse practitioners or other licensed health care professionals (the Practitioner), to provide me with telemedicine health care services (the "Services") as deemed necessary by the treating Practitioner. I acknowledge and consent to receive the Services by the Practitioner via telemedicine. I understand that the telemedicine visit will involve communicating with the Practitioner through live audiovisual communication technology and the disclosure of certain medical information by electronic transmission. I acknowledge that I have been given the opportunity to request an in-person assessment or other available alternative prior to the telemedicine visit and am voluntarily participating in the telemedicine visit.  I understand that I have the right to withhold or withdraw my consent to the use of telemedicine in the course of my care at any time, without affecting my right to future care or treatment, and that the Practitioner or I may terminate the telemedicine visit at any time. I understand that I have the right to inspect all information obtained and/or recorded in the course of the telemedicine visit and may receive copies of available information for a reasonable fee.  I understand that some of the potential risks of receiving the Services via telemedicine include:  Marland Kitchen Delay or interruption in medical evaluation due to technological equipment failure or disruption; . Information transmitted may not be sufficient (e.g. poor resolution of images) to allow for appropriate medical decision making by the  Practitioner; and/or  . In rare instances, security protocols could fail, causing a breach of personal health information.  Furthermore, I acknowledge that it is my responsibility to provide information about my medical history, conditions and care that is complete and accurate to the best of my ability. I acknowledge that Practitioner's advice, recommendations, and/or decision may be based on factors not within their control, such as incomplete or inaccurate data provided by me or distortions of diagnostic images or specimens that may result from electronic transmissions. I understand that the practice of medicine is not an exact science and that Practitioner makes no warranties or guarantees regarding treatment outcomes. I acknowledge that a copy of this consent can be made available to me via my patient portal ([facility]), or I can request a printed copy by calling the office of [facility].    I understand that my insurance will be billed for this visit.   I have read or had this consent read to me. . I understand the contents of this consent, which adequately explains the benefits and risks of the Services being provided via telemedicine.  . I have been provided ample opportunity to ask questions regarding this consent and the Services and have had my questions answered to my satisfaction. . I give my informed consent for the services to be provided through the use of telemedicine in my medical care

## 2019-12-04 ENCOUNTER — Ambulatory Visit: Payer: BC Managed Care – PPO | Admitting: Dermatology

## 2019-12-25 NOTE — Progress Notes (Signed)
Virtual Visit via Telephone Note   This visit type was conducted due to national recommendations for restrictions regarding the COVID-19 Pandemic (e.g. social distancing) in an effort to limit this patient's exposure and mitigate transmission in our community.  Due to his co-morbid illnesses, this patient is at least at moderate risk for complications without adequate follow up.  This format is felt to be most appropriate for this patient at this time.  All issues noted in this document were discussed and addressed.  A limited physical exam was performed with this format.  Please refer to the patient's chart for his consent to telehealth for [facility].   Evaluation Performed:  Follow-up visit  This visit type was conducted due to national recommendations for restrictions regarding the COVID-19 Pandemic (e.g. social distancing).  This format is felt to be most appropriate for this patient at this time.  All issues noted in this document were discussed and addressed.  No physical exam was performed (except for noted visual exam findings with Video Visits).  Please refer to the patient's chart (MyChart message for video visits and phone note for telephone visits) for the patient's consent to telehealth for [name].  Date:  12/26/2019   ID:  [name] [name], DOB [birth year], MRN [id number]  Patient Location:  Home  Provider location:   [place]  PCP:  Tobe Sos, MD  Cardiologist:  Buford Dresser, MD  Sleep Medicine:  Fransico Him, MD Electrophysiologist:  None   Chief Complaint:  OSA  History of Present Illness:    [name] [name] is a 59 y.o. male who presents via audio/video conferencing for a telehealth visit today.    This is a 59yo male with a hx of OSA on CPAP, ASCAD, HLD and HTN.  He has been on CPAP for about 13 years.   He is doing well with his CPAP device and thinks that he has gotten used to it.  He tolerates the mask and feels the pressure is  adequate.  Since going on CPAP he feels rested in the am and has no significant daytime sleepiness.  He denies any significant mouth or nasal dryness or nasal congestion.  He does not think that he snores.  He recently wanted a new travel device and is now here for followup to document compliance per insurance requirements.   The patient does not have symptoms concerning for COVID-19 infection (fever, chills, cough, or new shortness of breath).   Prior CV studies:   The following studies were reviewed today:  PAP compliance  Past Medical History:  Diagnosis Date  . ADENOCARCINOMA, SIGMOID COLON    a. s/p resection/chemo  . Allergy    SEASONAL  . Anginal pain ([place])   . BARRETT'S ESOPHAGUS, HX OF   . CAD    a. 2007: DES to LAD and Diag;  b. 08/2011 non-ischemic myoview, EF 60%;  c. 08/2012 NSTEMI/Cath/PCI: LM n, LAD patent stent, D1 small, 80, D2 90 ISR (PTCA), LCX nl, RCA 20d, RPL 95 (2.25x12 Promus Premier DES), EF 55-65%.  . Diverticulosis   . DYSLIPIDEMIA   . GERD (gastroesophageal reflux disease)   . HIATAL HERNIA WITH REFLUX   . Hyperplastic colon polyp   . HYPERTENSION   . Myocardial infarction ([place])   . 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 s/p wedge resection  . SLEEP APNEA   . Sleep apnea    Past Surgical History:  Procedure Laterality Date  . COLON  RESECTION  1998  . COLON SURGERY    . CORONARY STENT PLACEMENT  2007   taxus  . INGUINAL HERNIA REPAIR N/A 05/31/2017   Procedure: BILATERAL LAPAROSCOPIC INGUINAL HERNIA REPAIR WITH MESH;  Surgeon: Coralie Keens, MD;  Location: [facility];  Service: General;  Laterality: N/A;  . INSERTION OF MESH Bilateral 05/31/2017   Procedure: INSERTION OF MESH;  Surgeon: Coralie Keens, MD;  Location: [facility];  Service: General;  Laterality: Bilateral;  . KNEE SURGERY    . LEFT HEART CATH AND CORONARY ANGIOGRAPHY N/A 08/31/2018   Procedure: LEFT HEART CATH AND CORONARY ANGIOGRAPHY;  Surgeon: Leonie Man, MD;  Location: [facility] CV LAB;  Service: Cardiovascular;  Laterality: N/A;  . LEFT HEART CATHETERIZATION WITH CORONARY ANGIOGRAM N/A 08/22/2012   Procedure: LEFT HEART CATHETERIZATION WITH CORONARY ANGIOGRAM;  Surgeon: Peter M Martinique, MD;  Location: [facility] CATH LAB;  Service: Cardiovascular;  Laterality: N/A;  . LEFT HEART CATHETERIZATION WITH CORONARY ANGIOGRAM N/A 09/12/2013   Procedure: LEFT HEART CATHETERIZATION WITH CORONARY ANGIOGRAM;  Surgeon: Troy Sine, MD;  Location: [facility] CATH LAB;  Service: Cardiovascular;  Laterality: N/A;  . LUNG REMOVAL, PARTIAL     LEFT  . TONSILLECTOMY       Current Meds  Medication Sig  . amLODipine (NORVASC) 5 MG tablet Take 5 mg by mouth 2 (two) times daily.   Marland Kitchen aspirin EC 81 MG tablet Take 1 tablet (81 mg total) by mouth daily.  . Cholecalciferol (VITAMIN D3) 2000 UNITS capsule Take 2,000 Units by mouth daily.   . clopidogrel (PLAVIX) 75 MG tablet Take 1 tablet (75 mg total) by mouth daily.  . cyanocobalamin (,VITAMIN B-12,) 1000 MCG/ML injection Inject 1,000 mcg into the muscle every 30 (thirty) days.   Marland Kitchen DEXILANT 60 MG capsule Take 60 mg by mouth daily.   . enalapril (VASOTEC) 10 MG tablet Take 10 mg by mouth 2 (two) times daily.  Marland Kitchen ezetimibe (ZETIA) 10 MG tablet Take 10 mg by mouth daily.  . fexofenadine (ALLEGRA) 180 MG tablet Take 180 mg by mouth daily as needed for allergies or rhinitis.  Marland Kitchen ibuprofen (ADVIL,MOTRIN) 800 MG tablet Take 800 mg by mouth every 8 (eight) hours as needed for headache or moderate pain.  Marland Kitchen loratadine (CLARITIN) 10 MG tablet Take 10 mg by mouth daily as needed for allergies.   . metoprolol succinate (TOPROL-XL) 25 MG 24 hr tablet Take 25 mg by mouth every evening.   . montelukast (SINGULAIR) 10 MG tablet Take 10 mg by mouth daily as needed (for allergies).  . Multiple Vitamins-Minerals (MULTIVITAMIN PO) Take 1 tablet by mouth daily.  Marland Kitchen omega-3 acid ethyl esters (LOVAZA) 1 G capsule Take 2 g by mouth 2 (two) times daily.      Allergies:    Shellfish-derived products and Isosorbide   Social History   Tobacco Use  . Smoking status: Never Smoker  . Smokeless tobacco: Never Used  Vaping Use  . Vaping Use: Never used  Substance Use Topics  . Alcohol use: No  . Drug use: No     Family Hx: The patient's family history includes Colon polyps in his mother; Heart attack in his paternal uncle and paternal uncle; Heart disease in his father and mother. There is no history of Colon cancer.  ROS:   Please see the history of present illness.     All other systems reviewed and are negative.   Labs/Other Tests and Data Reviewed:    Recent Labs: No  results found for requested labs within last 8760 hours.   Recent Lipid Panel Lab Results  Component Value Date/Time   CHOL 123 08/31/2018 09:25 AM   TRIG 93 08/31/2018 09:25 AM   HDL 39 (L) 08/31/2018 09:25 AM   CHOLHDL 3.2 08/31/2018 09:25 AM   LDLCALC 65 08/31/2018 09:25 AM    Wt Readings from Last 3 Encounters:  12/26/19 193 lb (87.5 kg)  05/30/19 191 lb 3.2 oz (86.7 kg)  08/31/18 195 lb 14.4 oz (88.9 kg)     Objective:    Vital Signs:  BP 130/82   Pulse 61   Ht 5\' 11"  (1.803 m)   Wt 193 lb (87.5 kg)   SpO2 99%   BMI 26.92 kg/m     ASSESSMENT & PLAN:    1.  OSA -  The patient is tolerating PAP therapy well without any problems. The PAP download was reviewed today and showed an AHI of 1.1/hr on 8 cm H2O with 60% compliance in using more than 4 hours nightly.  The patient has been using and benefiting from PAP use and will continue to benefit from therapy.  -his compliance was low but he did not have supplies for 2 weeks. He is using it every night now  2.  HTN -BP controlled -continue Enalapril 10mg  BID, Toprol XL 25mg  daily and amlodipine 5mg  BID -followed by PCP   COVID-19 Education: The signs and symptoms of COVID-19 were discussed with the patient and how to seek care for testing (follow up with PCP or arrange E-visit).  The importance of social  distancing was discussed today.  Patient Risk:   After full review of this patient's clinical status, I feel that they are at least moderate risk at this time.  Time:   Today, I have spent 15 minutes on telemedicine discussing medical problems including OSA, HTN and reviewing patient's chart including PAP compliance download.  Medication Adjustments/Labs and Tests Ordered: Current medicines are reviewed at length with the patient today.  Concerns regarding medicines are outlined above.  Tests Ordered: No orders of the defined types were placed in this encounter.  Medication Changes: No orders of the defined types were placed in this encounter.   Disposition:  Follow up in 1 year(s)  Signed, Fransico Him, MD  12/26/2019 8:14 AM    [place]

## 2019-12-26 ENCOUNTER — Encounter: Payer: Self-pay | Admitting: Cardiology

## 2019-12-26 ENCOUNTER — Telehealth (INDEPENDENT_AMBULATORY_CARE_PROVIDER_SITE_OTHER): Payer: BC Managed Care – PPO | Admitting: Cardiology

## 2019-12-26 ENCOUNTER — Other Ambulatory Visit: Payer: Self-pay

## 2019-12-26 VITALS — BP 130/82 | HR 61 | Ht 71.0 in | Wt 193.0 lb

## 2019-12-26 DIAGNOSIS — I10 Essential (primary) hypertension: Secondary | ICD-10-CM | POA: Diagnosis not present

## 2019-12-26 DIAGNOSIS — G4733 Obstructive sleep apnea (adult) (pediatric): Secondary | ICD-10-CM

## 2019-12-26 NOTE — Patient Instructions (Signed)
Medication Instructions:  Your physician recommends that you continue on your current medications as directed. Please refer to the Current Medication list given to you today.  *If you need a refill on your cardiac medications before your next appointment, please call your pharmacy*   Lab Work: none If you have labs (blood work) drawn today and your tests are completely normal, you will receive your results only by: . MyChart Message (if you have MyChart) OR . A paper copy in the mail If you have any lab test that is abnormal or we need to change your treatment, we will call you to review the results.   Testing/Procedures: none   Follow-Up: At [facility], you and your health needs are our priority.  As part of our continuing mission to provide you with exceptional heart care, we have created designated Provider Care Teams.  These Care Teams include your primary Cardiologist (physician) and Advanced Practice Providers (APPs -  Physician Assistants and Nurse Practitioners) who all work together to provide you with the care you need, when you need it.  We recommend signing up for the patient portal called "MyChart".  Sign up information is provided on this After Visit Summary.  MyChart is used to connect with patients for Virtual Visits (Telemedicine).  Patients are able to view lab/test results, encounter notes, upcoming appointments, etc.  Non-urgent messages can be sent to your provider as well.   To learn more about what you can do with MyChart, go to [email or web address].    Your next appointment:   12 month(s)  The format for your next appointment:   Virtual Visit   Provider:   Traci Turner, MD   Other Instructions   

## 2020-02-19 ENCOUNTER — Other Ambulatory Visit: Payer: Self-pay

## 2020-02-19 ENCOUNTER — Encounter: Payer: Self-pay | Admitting: Dermatology

## 2020-02-19 ENCOUNTER — Ambulatory Visit (INDEPENDENT_AMBULATORY_CARE_PROVIDER_SITE_OTHER): Payer: BC Managed Care – PPO | Admitting: Dermatology

## 2020-02-19 DIAGNOSIS — Z1283 Encounter for screening for malignant neoplasm of skin: Secondary | ICD-10-CM

## 2020-02-19 DIAGNOSIS — L570 Actinic keratosis: Secondary | ICD-10-CM

## 2020-02-19 NOTE — Patient Instructions (Addendum)
Routine follow-up for [name] [name] date of birth Jul 29, 1960.  Of chief concern was a spot above the right outer eyebrow which is a subtle 2 mm flat crust that fits a solar keratosis (precancer).  Most of these will not become cancer, they never become melanoma, and most will clear with a simple 5-second liquid nitrogen freeze.  You can expect this area to swell and there may be peeling in the next 2 weeks.  There is no special care in terms of bandaging or bathing.  Follow-up for the right forehead would be either via MyChart or phone call in 4weeks just let me know that is smooth.  The remainder of [name] [name]'s skin exam from waist to scalp showed no atypical moles, melanoma, or nonmole skin cancer.  There are 3 types of benign nonmoles.  On the left temple and right upper arm are textured brown benign keratoses.  On the right volar forearm is a firm 3 mm pink nodule that fits a small fibroma.  Scattered on the trunk are 2 mm red dots call cherry angiomas.  None of these require freezing or biopsy or close watching unless there were obvious clinical change.  Routine complete skin examination in 1 year.

## 2020-02-22 ENCOUNTER — Other Ambulatory Visit: Payer: Self-pay

## 2020-02-22 ENCOUNTER — Ambulatory Visit (INDEPENDENT_AMBULATORY_CARE_PROVIDER_SITE_OTHER): Payer: BC Managed Care – PPO | Admitting: Cardiology

## 2020-02-22 ENCOUNTER — Encounter: Payer: Self-pay | Admitting: Cardiology

## 2020-02-22 VITALS — BP 132/78 | HR 53 | Ht 71.0 in | Wt 202.0 lb

## 2020-02-22 DIAGNOSIS — I10 Essential (primary) hypertension: Secondary | ICD-10-CM | POA: Diagnosis not present

## 2020-02-22 DIAGNOSIS — Z7189 Other specified counseling: Secondary | ICD-10-CM | POA: Diagnosis not present

## 2020-02-22 DIAGNOSIS — E782 Mixed hyperlipidemia: Secondary | ICD-10-CM

## 2020-02-22 DIAGNOSIS — I2510 Atherosclerotic heart disease of native coronary artery without angina pectoris: Secondary | ICD-10-CM | POA: Diagnosis not present

## 2020-02-22 NOTE — Patient Instructions (Signed)

## 2020-02-22 NOTE — Progress Notes (Signed)
Cardiology Office Note:    Date:  02/22/2020   ID:  [name] [name], DOB Jan 15, 1961, MRN [id number]  PCP:  Tobe Sos, MD  Cardiologist:  Buford Dresser, MD PhD (previously Dr. Saunders Revel)  Referring MD: Tobe Sos, MD   CC: Follow up  History of Present Illness:    [name] [name] is a 59 y.o. male with a hx of CAD s/p remote PCI of PL in 2014 and prior stent of D2, hypertension, hyperlipidemia, OSA on CPAP who is seen in follow up for the evaluation and management of CAD.  Today: No specific concerns today. Notes that his heart rates can be in the 50s at rest, increases with activity. Can be 80s average if he forgets his metoprolol.  Works on [employer], has to climb stairs routinely with a 50 lbs backpack. Can got up to 7 flights routinely (has had to got up to 20 flights in the past).  Checks BP at home, average 130s/60s. No very high/low numbers.   Denies chest pain, shortness of breath at rest or with normal exertion. No PND, orthopnea, LE edema or unexpected weight gain. No syncope or palpitations.  Past Medical History:  Diagnosis Date   ADENOCARCINOMA, SIGMOID COLON    a. s/p resection/chemo   Allergy    SEASONAL   Anginal pain ([place])    BARRETT'S ESOPHAGUS, HX OF    CAD    a. 2007: DES to LAD and Diag;  b. 08/2011 non-ischemic myoview, EF 60%;  c. 08/2012 NSTEMI/Cath/PCI: LM n, LAD patent stent, D1 small, 80, D2 90 ISR (PTCA), LCX nl, RCA 20d, RPL 95 (2.25x12 Promus Premier DES), EF 55-65%.   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 s/p wedge resection   SLEEP APNEA    Sleep apnea     Past Surgical History:  Procedure Laterality Date   COLON RESECTION  1998   COLON SURGERY     CORONARY STENT PLACEMENT  2007   taxus   INGUINAL HERNIA  REPAIR N/A 05/31/2017   Procedure: BILATERAL LAPAROSCOPIC INGUINAL HERNIA REPAIR WITH MESH;  Surgeon: Coralie Keens, MD;  Location: [place];  Service: General;  Laterality: N/A;   INSERTION OF MESH Bilateral 05/31/2017   Procedure: INSERTION OF MESH;  Surgeon: Coralie Keens, MD;  Location: [place];  Service: General;  Laterality: Bilateral;   KNEE SURGERY     LEFT HEART CATH AND CORONARY ANGIOGRAPHY N/A 08/31/2018   Procedure: LEFT HEART CATH AND CORONARY ANGIOGRAPHY;  Surgeon: Leonie Man, MD;  Location: [place] CV LAB;  Service: Cardiovascular;  Laterality: N/A;   LEFT HEART CATHETERIZATION WITH CORONARY ANGIOGRAM N/A 08/22/2012   Procedure: LEFT HEART CATHETERIZATION WITH CORONARY ANGIOGRAM;  Surgeon: Peter M Martinique, MD;  Location: [facility] CATH LAB;  Service: Cardiovascular;  Laterality: N/A;   LEFT HEART CATHETERIZATION WITH CORONARY ANGIOGRAM N/A 09/12/2013   Procedure: LEFT HEART CATHETERIZATION WITH CORONARY ANGIOGRAM;  Surgeon: Troy Sine, MD;  Location: [facility] CATH LAB;  Service: Cardiovascular;  Laterality: N/A;   LUNG REMOVAL, PARTIAL     LEFT   TONSILLECTOMY      Current Medications: Current Outpatient Medications on File Prior to Visit  Medication Sig   amLODipine (NORVASC) 5 MG tablet Take 5 mg by mouth  2 (two) times daily.    aspirin EC 81 MG tablet Take 1 tablet (81 mg total) by mouth daily.   Cholecalciferol (VITAMIN D3) 2000 UNITS capsule Take 2,000 Units by mouth daily.    clopidogrel (PLAVIX) 75 MG tablet Take 1 tablet (75 mg total) by mouth daily.   cyanocobalamin (,VITAMIN B-12,) 1000 MCG/ML injection Inject 1,000 mcg into the muscle every 30 (thirty) days.    DEXILANT 60 MG capsule Take 60 mg by mouth daily.    enalapril (VASOTEC) 10 MG tablet Take 10 mg by mouth 2 (two) times daily.   ezetimibe (ZETIA) 10 MG tablet Take 10 mg by mouth daily.   fexofenadine (ALLEGRA) 180 MG tablet Take 180 mg by mouth daily as needed for allergies or rhinitis.    ibuprofen (ADVIL,MOTRIN) 800 MG tablet Take 800 mg by mouth every 8 (eight) hours as needed for headache or moderate pain.   loratadine (CLARITIN) 10 MG tablet Take 10 mg by mouth daily as needed for allergies.    metoprolol succinate (TOPROL-XL) 25 MG 24 hr tablet Take 25 mg by mouth every evening.    montelukast (SINGULAIR) 10 MG tablet Take 10 mg by mouth daily as needed (for allergies).   Multiple Vitamins-Minerals (MULTIVITAMIN PO) Take 1 tablet by mouth daily.   omega-3 acid ethyl esters (LOVAZA) 1 G capsule Take 2 g by mouth 2 (two) times daily.    rosuvastatin (CRESTOR) 10 MG tablet Take 10 mg by mouth daily.   No current facility-administered medications on file prior to visit.     Allergies:   Shellfish-derived products, Isosorbide, Levofloxacin, and Clarithromycin   Social History   Tobacco Use   Smoking status: Never Smoker   Smokeless tobacco: Never Used  Vaping Use   Vaping Use: Never used  Substance Use Topics   Alcohol use: No   Drug use: No    Family History: The patient's family history includes Colon polyps in his mother; Heart attack in his paternal uncle and paternal uncle; Heart disease in his father and mother. There is no history of Colon cancer.  ROS:   Please see the history of present illness.  Additional pertinent ROS otherwise unremarkable.  EKGs/Labs/Other Studies Reviewed:    The following studies were reviewed today: Cath/PCI:  LHC (09/12/13): LMCA normal. Normal LAD with patent mid vessel stent. D2 with 20-30% stenosis proximal to patent stent. LCx without significant disease. RCA with 20-30% disease. 20% ostial PDA stenosis. Patent stent inPL branch.  LHC/PCI (08/22/12): LMCA normal. LAD with patent mid vessel stent. Small first diagonal branch with 80% ostial stenosis. Large second diagonal branch with 90% in-stent restenosis. LCx normal. Large, dominant RCA with mild luminal irregularities in the mid and distal vessel. PL branch has a  95% focal stenosis. LVEF 55-65%. Successful PTCA to D2 stent withAngioSculpt2.5 x 10 mm balloon. Successful PCI to PL branch with a Promus Premier 2.25 x 12 mm drug-eluting stent.  CV Surgery:  None  EP Procedures and Devices:  None  Non-Invasive Evaluation(s):  Exercise myocardial perfusion stress test (09/14/11): Good exercise capacity (9 minutes,0seconds). No significant ST-T changes. Occasional PVCs noted. Normal myocardial perfusion without evidence of ischemia or scar. LVEF 60%.  EKG:  EKG is personally reviewed.  The ekg ordered today demonstrates sinus bradycardia at 53 bpm.  Recent Labs: No results found for requested labs within last 8760 hours.  Recent Lipid Panel    Component Value Date/Time   CHOL 123 08/31/2018 0925   TRIG 93 08/31/2018  0925   HDL 39 (L) 08/31/2018 0925   CHOLHDL 3.2 08/31/2018 0925   VLDL 19 08/31/2018 0925   LDLCALC 65 08/31/2018 0925    Physical Exam:    VS:  BP 132/78    Pulse (!) 53    Ht 5\' 11"  (1.803 m)    Wt 202 lb (91.6 kg)    SpO2 100%    BMI 28.17 kg/m     Wt Readings from Last 3 Encounters:  02/22/20 202 lb (91.6 kg)  12/26/19 193 lb (87.5 kg)  05/30/19 191 lb 3.2 oz (86.7 kg)    GEN: Well nourished, well developed in no acute distress HEENT: Normal, moist mucous membranes NECK: No JVD CARDIAC: regular rhythm, normal S1 and S2, no rubs or gallops. No murmur. VASCULAR: Radial and DP pulses 2+ bilaterally. No carotid bruits RESPIRATORY:  Clear to auscultation without rales, wheezing or rhonchi  ABDOMEN: Soft, non-tender, non-distended MUSCULOSKELETAL:  Ambulates independently SKIN: Warm and dry, no edema NEUROLOGIC:  Alert and oriented x 3. No focal neuro deficits noted. PSYCHIATRIC:  Normal affect   ASSESSMENT:    1. Coronary artery disease involving native coronary artery of native heart without angina pectoris   2. Mixed hyperlipidemia   3. Essential hypertension   4. Cardiac risk counseling   5. Counseling on  health promotion and disease prevention    PLAN:    CAD s/p prior remote PCI, no current angina:  -continue aspirin 81 mg, clopidogrel 75 mg. He was told to be on clopidogrel indefinitely -continue metoprolol succinate 25 mg  Hypertension: near goal.  -Continue amlodipine 5 mg, enalapril 10 mg BID, metoprolol succinate 25 mg  Hyperlipidemia, mixed: LDL goal <70. TG goal <150 - Prior LDL was 65. Last TG 93 -continue rosuvastatin 10 mg, ezetimibe, lovaza  Secondary prevention: -recommend heart healthy/Mediterranean diet, with whole grains, fruits, vegetable, fish, lean meats, nuts, and olive oil. Limit salt. -recommend moderate walking, 3-5 times/week for 30-50 minutes each session. Aim for at least 150 minutes.week. Goal should be pace of 3 miles/hours, or walking 1.5 miles in 30 minutes -recommend avoidance of tobacco products. Avoid excess alcohol. -Additional risk factor control:  -Diabetes risk: A1c 5.5 in 2020  -Weight: BMI 28  -Sleep apnea: on CPAP  Plan for follow up: 1 year  Medication Adjustments/Labs and Tests Ordered: Current medicines are reviewed at length with the patient today.  Concerns regarding medicines are outlined above.  Orders Placed This Encounter  Procedures   EKG 12-Lead   No orders of the defined types were placed in this encounter.   Patient Instructions  Medication Instructions:  Your Physician recommend you continue on your current medication as directed.    *If you need a refill on your cardiac medications before your next appointment, please call your pharmacy*   Lab Work: None ordered   Testing/Procedures: None ordered    Follow-Up: At [facility], you and your health needs are our priority.  As part of our continuing mission to provide you with exceptional heart care, we have created designated Provider Care Teams.  These Care Teams include your primary Cardiologist (physician) and Advanced Practice Providers (APPs -  Physician  Assistants and Nurse Practitioners) who all work together to provide you with the care you need, when you need it.  We recommend signing up for the patient portal called "MyChart".  Sign up information is provided on this After Visit Summary.  MyChart is used to connect with patients for Virtual Visits (Telemedicine).  Patients are able to view lab/test results, encounter notes, upcoming appointments, etc.  Non-urgent messages can be sent to your provider as well.   To learn more about what you can do with MyChart, go to [email or web address].    Your next appointment:   1 year(s)  The format for your next appointment:   In Person  Provider:   Buford Dresser, MD       Signed, Buford Dresser, MD PhD 02/22/2020 2:28 PM    [place]

## 2020-03-23 ENCOUNTER — Encounter: Payer: Self-pay | Admitting: Dermatology

## 2020-03-23 NOTE — Progress Notes (Signed)
   New Patient   Subjective  [name] [name] is a 59 y.o. male who presents for the following: Skin Problem (just wants few sots checked).  General skin check, concern with crust over eyebrow Location:  Duration:  Quality:  Associated Signs/Symptoms: Modifying Factors:  Severity:  Timing: Context:    The following portions of the chart were reviewed this encounter and updated as appropriate: Tobacco  Allergies  Meds  Problems  Med Hx  Surg Hx  Fam Hx      Objective  Well appearing patient in no apparent distress; mood and affect are within normal limits.  All skin waist up examined.   Assessment & Plan  AK (actinic keratosis) Right Forehead  Destruction of lesion - Right Forehead Complexity: simple   Destruction method: cryotherapy   Informed consent: discussed and consent obtained   Lesion destroyed using liquid nitrogen: Yes   Cryotherapy cycles:  5 Outcome: patient tolerated procedure well with no complications    Encounter for screening for malignant neoplasm of skin Mid Back  Annual skin examination Skin cancer screening performed today.

## 2021-01-06 ENCOUNTER — Other Ambulatory Visit: Payer: Self-pay | Admitting: Surgery

## 2021-01-06 ENCOUNTER — Telehealth: Payer: Self-pay | Admitting: *Deleted

## 2021-01-06 NOTE — Telephone Encounter (Signed)
   Name: [name] [name]  DOB: 07-18-1960  MRN: [id number]   Primary Cardiologist: Buford Dresser, MD  Chart reviewed as part of pre-operative protocol coverage. Patient was contacted 01/06/2021 in reference to pre-operative risk assessment for pending surgery as outlined below.  [name] [name] was last seen on 02/2020 by Dr. Harrell Gave with pertinent CV hx to include CAD s/p remote PCI (last in 2014), HTN, HLD, OSA. Last cath 2020 with patent stents, otherwise minimal CAD, normal LVEF. I reached out to patient for update on how he is doing. The patient affirms he has been doing well without any new cardiac symptoms. 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 Therefore, based on ACC/AHA guidelines, the patient would be at acceptable risk for the planned procedure without further cardiovascular testing. The patient was advised that if he develops new symptoms prior to surgery to contact our office to arrange for a follow-up visit, and he verbalized understanding.  He was previously cleared to hold Plavix for 5 days for an unrelated procedure so given no recent clinical changes, we can apply the same clearance here. The patient states he was not told to hold his aspirin so would continue if possible. If he is required to hold his aspirin please let our office know to weigh in on this as well.  I will route this recommendation to the requesting party via Epic fax function and remove from pre-op pool. Please call with questions.  Charlie Pitter, PA-C 01/06/2021, 3:27 PM

## 2021-01-06 NOTE — Telephone Encounter (Signed)
   [facility] Pre-operative Risk Assessment    Patient Name: [name] [name]  DOB: November 19, 1960 MRN: [id number]  HEARTCARE STAFF:  - IMPORTANT!!!!!! Under Visit Info/Reason for Call, type in Other and utilize the format Clearance MM/DD/YY or Clearance TBD. Do not use dashes or single digits. - Please review there is not already an duplicate clearance open for this procedure. - If request is for dental extraction, please clarify the # of teeth to be extracted. - If the patient is currently at the dentist's office, call Pre-Op Callback Staff (MA/nurse) to input urgent request.  - If the patient is not currently in the dentist office, please route to the Pre-Op pool.  Request for surgical clearance:  What type of surgery is being performed? HERNIA REPAIR  When is this surgery scheduled? TBD  What type of clearance is required (medical clearance vs. Pharmacy clearance to hold med vs. Both)? MEDICAL  Are there any medications that need to be held prior to surgery and how long?  PLAVIX   Practice name and name of physician performing surgery? [facility]; DR. Coralie Keens  What is the office phone number? [phone]   7.   What is the office fax number? [place], CMA  8.   Anesthesia type (None, local, MAC, general) ? GENERAL   Julaine Hua 01/06/2021, 1:05 PM  _________________________________________________________________   (provider comments below)

## 2021-01-21 NOTE — Progress Notes (Signed)
Surgical Instructions    Your procedure is scheduled on 02/04/21.  Report to [facility] Main Entrance "A" at 0530 A.M., then check in with the Admitting office.  Call this number if you have problems the morning of surgery:  [phone]   If you have any questions prior to your surgery date call [phone]: Open Monday-Friday 8am-4pm    Remember:  Do not eat after midnight the night before your surgery  You may drink clear liquids until 04:30AM the morning of your surgery.   Clear liquids allowed are: Water, Non-Citrus Juices (without pulp), Carbonated Beverages, Clear Tea, Black Coffee ONLY (NO MILK, CREAM OR POWDERED CREAMER of any kind), and Gatorade  Please complete your PRE-SURGERY ENSURE that was provided to you by ... the morning of surgery.  Please, if able, drink it in one setting. DO NOT SIP.     Take these medicines the morning of surgery with A SIP OF WATER  amLODipine (NORVASC) ezetimibe (ZETIA) DEXILANT 60 MG  fexofenadine (ALLEGRA) metoprolol succinate (TOPROL-XL) montelukast (SINGULAIR rosuvastatin (CRESTOR)   Please follow your surgeon's instructions for aspirin 325 MG & clopidogrel (PLAVIX). If you have not received instructions, please contact your surgeon's office for blood thinner instructions.   As of today, STOP taking any Aspirin (unless otherwise instructed by your surgeon) Aleve, Naproxen, Ibuprofen, Motrin, Advil, Goody's, BC's, all herbal medications, fish oil, and all vitamins.          Do not wear jewelry or makeup Do not wear lotions, powders, perfumes/colognes, or deodorant. Do not shave 48 hours prior to surgery.  Men may shave face and neck. Do not bring valuables to the hospital. DO Not wear nail polish, gel polish, artificial nails, or any other type of covering on  natural nails including finger and toenails. If patients have artificial nails, gel coating, etc. that need to be removed by a nail salon please have this removed prior to  surgery or surgery may need to be canceled/delayed if the surgeon/ anesthesia feels like the patient is unable to be adequately monitored.             [facility] is not responsible for any belongings or valuables.  Do NOT Smoke (Tobacco/Vaping)  24 hours prior to your procedure If you use a CPAP at night, you may bring all equipment for your overnight stay.   Contacts, glasses, dentures or bridgework may not be worn into surgery, please bring cases for these belongings   For patients admitted to the hospital, discharge time will be determined by your treatment team.   Patients discharged the day of surgery will not be allowed to drive home, and someone needs to stay with them for 24 hours.  ONLY 1 SUPPORT PERSON MAY BE PRESENT WHILE YOU ARE IN SURGERY. IF YOU ARE TO BE ADMITTED ONCE YOU ARE IN YOUR ROOM YOU WILL BE ALLOWED TWO (2) VISITORS.  Minor children may have two parents present. Special consideration for safety and communication needs will be reviewed on a case by case basis.  Special instructions:    Oral Hygiene is also important to reduce your risk of infection.  Remember - BRUSH YOUR TEETH THE MORNING OF SURGERY WITH YOUR REGULAR TOOTHPASTE   [facility]- Preparing For Surgery  Before surgery, you can play an important role. Because skin is not sterile, your skin needs to be as free of germs as possible. You can reduce the number of germs on your skin by washing with CHG (chlorahexidine gluconate) Soap  before surgery.  CHG is an antiseptic cleaner which kills germs and bonds with the skin to continue killing germs even after washing.     Please do not use if you have an allergy to CHG or antibacterial soaps. If your skin becomes reddened/irritated stop using the CHG.  Do not shave (including legs and underarms) for at least 48 hours prior to first CHG shower. It is OK to shave your face.  Please follow these instructions carefully.     Shower the NIGHT BEFORE SURGERY and  the MORNING OF SURGERY with CHG Soap.   If you chose to wash your hair, wash your hair first as usual with your normal shampoo. After you shampoo, rinse your hair and body thoroughly to remove the shampoo.  Then [employer] and genitals (private parts) with your normal soap and rinse thoroughly to remove soap.  After that Use CHG Soap as you would any other liquid soap. You can apply CHG directly to the skin and wash gently with a scrungie or a clean washcloth.   Apply the CHG Soap to your body ONLY FROM THE NECK DOWN.  Do not use on open wounds or open sores. Avoid contact with your eyes, ears, mouth and genitals (private parts). Wash Face and genitals (private parts)  with your normal soap.   Wash thoroughly, paying special attention to the area where your surgery will be performed.  Thoroughly rinse your body with warm water from the neck down.  DO NOT shower/wash with your normal soap after using and rinsing off the CHG Soap.  Pat yourself dry with a CLEAN TOWEL.  Wear CLEAN PAJAMAS to bed the night before surgery  Place CLEAN SHEETS on your bed the night before your surgery  DO NOT SLEEP WITH PETS.   Day of Surgery:  Take a shower with CHG soap. Wear Clean/Comfortable clothing the morning of surgery Do not apply any deodorants/lotions.   Remember to brush your teeth WITH YOUR REGULAR TOOTHPASTE.   Please read over the following fact sheets that you were given.

## 2021-01-22 ENCOUNTER — Encounter (HOSPITAL_COMMUNITY): Payer: Self-pay

## 2021-01-22 ENCOUNTER — Other Ambulatory Visit: Payer: Self-pay

## 2021-01-22 ENCOUNTER — Encounter (HOSPITAL_COMMUNITY)
Admission: RE | Admit: 2021-01-22 | Discharge: 2021-01-22 | Disposition: A | Discharge status: Home / Self Care | Payer: BC Managed Care – PPO | Source: Ambulatory Visit | Attending: Surgery | Admitting: Surgery

## 2021-01-22 DIAGNOSIS — Z01812 Encounter for preprocedural laboratory examination: Secondary | ICD-10-CM | POA: Diagnosis present

## 2021-01-22 LAB — CBC
HCT: 43.9 % (ref 39.0–52.0)
Hemoglobin: 14.6 g/dL (ref 13.0–17.0)
MCH: 30 pg (ref 26.0–34.0)
MCHC: 33.3 g/dL (ref 30.0–36.0)
MCV: 90.3 fL (ref 80.0–100.0)
Platelets: 215 10*3/uL (ref 150–400)
RBC: 4.86 MIL/uL (ref 4.22–5.81)
RDW: 13 % (ref 11.5–15.5)
WBC: 7.2 10*3/uL (ref 4.0–10.5)
nRBC: 0 % (ref 0.0–0.2)

## 2021-01-22 LAB — BASIC METABOLIC PANEL
Anion gap: 7 (ref 5–15)
BUN: 19 mg/dL (ref 6–20)
CO2: 26 mmol/L (ref 22–32)
Calcium: 9.5 mg/dL (ref 8.9–10.3)
Chloride: 104 mmol/L (ref 98–111)
Creatinine, Ser: 1.02 mg/dL (ref 0.61–1.24)
GFR, Estimated: >60 mL/min (ref >60)
Glucose, Bld: 100 mg/dL — ABNORMAL HIGH (ref 70–99)
Potassium: 4.3 mmol/L (ref 3.5–5.1)
Sodium: 137 mmol/L (ref 135–145)

## 2021-01-22 NOTE — Progress Notes (Signed)
PCP - Ralph Leyden Cardiologist - Dr. Buford Dresser  Chest x-ray - n/a EKG - 02/22/20  SA - yes, wears CPAP  Blood Thinner Instructions: Hold Plavix 5 days prior to surgery (per Dr. Harrell Gave) Aspirin Instructions: Follow your surgeon's instructions on when to stop your ASA  ERAS Protcol - yes, Ensure ordered & given   COVID TEST- n/a (ambulatory surgery)   Anesthesia review: yes, received cardiac clearance from Dr. Harrell Gave (see note 01/06/21)  Patient denies shortness of breath, fever, cough and chest pain at PAT appointment   All instructions explained to the patient, with a verbal understanding of the material. Patient agrees to go over the instructions while at home for a better understanding. Patient also instructed to self quarantine after being tested for COVID-19. The opportunity to ask questions was provided.

## 2021-01-23 ENCOUNTER — Encounter (HOSPITAL_COMMUNITY): Payer: Self-pay

## 2021-01-23 NOTE — Progress Notes (Signed)
Anesthesia Chart Review:  Case: [id number] Date/Time: 02/04/21 0715   Procedure: OPEN INCISIONAL HERNIA REPAIR WITH MESH   Anesthesia type: General   Pre-op diagnosis: INCISIONAL HERNIA   Location: [place] OR ROOM 02 / [place] [facility]   Surgeons: Coralie Keens, MD       DISCUSSION: Patient is a 60 year old male scheduled for the above procedure.  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   Preoperative cardiology input outlined by Melina Copa, PA-C on 01/06/21, "..I reached out to patient for update on how he is doing. The patient affirms he has been doing well without any new cardiac symptoms. 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 Therefore, based on ACC/AHA guidelines, the patient would be at acceptable risk for the planned procedure without further cardiovascular testing.Marland KitchenHe was previously cleared to hold Plavix for 5 days for an unrelated procedure so given no recent clinical changes, we can apply the same clearance here. The patient states he was not told to hold his aspirin so would continue if possible. If he is required to hold his aspirin please let our office know to weigh in on this as well."   Anesthesia team to evaluate on the day of surgery.   VS: BP (!) 151/78   Pulse (!) 52   Temp 36.6 C (Oral)   Resp 18   Ht '5\' 11"'$  (1.803 m)   Wt 91 kg   SpO2 98%   BMI 27.98 kg/m    PROVIDERS: Tobe Sos, MD is PCP  - Buford Dresser, MD is cardiologist. Last visit 02/22/20.  1 year follow-up planned.   LABS: Labs reviewed: Acceptable for surgery.  (all labs ordered are listed, but only abnormal results are displayed)  Labs Reviewed  BASIC  METABOLIC PANEL - Abnormal; Notable for the following components:      Result Value   Glucose, Bld 100 (*)    All other components within normal limits  CBC    EKG: 02/22/20: SB at 53 bpm   CV: Cardiac cath 08/31/18: The left ventricular systolic function is normal. The left ventricular ejection fraction is 55-65% by visual estimate. LV end diastolic pressure is normal. ---------------------------------------------- -- Previously placed Prox LAD to Mid LAD DES stent is widely patent. -- Previously placed Ost 1st Diag-2 DES stent is 5% stenosed. -- Previously placed Post Atrio DES stent is widely patent.   SUMMARY Widely patent LAD-diagonal and PLA stents. Otherwise minimal CAD. Normal EF and EDP.   RECOMMENDATIONS Considered non-anginal reason for chest pain. Continue risk factor modification   Past Medical History:  Diagnosis Date   ADENOCARCINOMA, SIGMOID COLON    a. s/p resection/chemo   Allergy    SEASONAL   Anginal pain ([place])    BARRETT'S ESOPHAGUS, HX OF    CAD    a. 2007: DES to LAD and Diag;  b. 08/2011 non-ischemic myoview, EF 60%;  c. 08/2012 NSTEMI/Cath/PCI: LM n, LAD patent stent, D1 small, 80, D2 90 ISR (PTCA), LCX nl, RCA 20d, RPL 95 (2.25x12 Promus Premier DES), EF 55-65%.   Diverticulosis    DYSLIPIDEMIA    GERD (gastroesophageal reflux disease)    HIATAL HERNIA  WITH REFLUX    Hyperplastic colon polyp    HYPERTENSION    Myocardial infarction [facility])    Secondary malignant neoplasm of lung ([place])    a. s/p wedge resection   Sleep apnea     Past Surgical History:  Procedure Laterality Date   COLON RESECTION  05/18/1996   COLON SURGERY     CORONARY ANGIOPLASTY     CORONARY STENT PLACEMENT  05/18/2005   taxus   INGUINAL HERNIA REPAIR N/A 05/31/2017   Procedure: BILATERAL LAPAROSCOPIC INGUINAL HERNIA REPAIR WITH MESH;  Surgeon: Coralie Keens, MD;  Location: [place];  Service: General;  Laterality: N/A;   INSERTION OF MESH Bilateral 05/31/2017    Procedure: INSERTION OF MESH;  Surgeon: Coralie Keens, MD;  Location: [place];  Service: General;  Laterality: Bilateral;   KNEE SURGERY     LEFT HEART CATH AND CORONARY ANGIOGRAPHY N/A 08/31/2018   Procedure: LEFT HEART CATH AND CORONARY ANGIOGRAPHY;  Surgeon: Leonie Man, MD;  Location: [place] CV LAB;  Service: Cardiovascular;  Laterality: N/A;   LEFT HEART CATHETERIZATION WITH CORONARY ANGIOGRAM N/A 08/22/2012   Procedure: LEFT HEART CATHETERIZATION WITH CORONARY ANGIOGRAM;  Surgeon: Peter M Martinique, MD;  Location: [facility] CATH LAB;  Service: Cardiovascular;  Laterality: N/A;   LEFT HEART CATHETERIZATION WITH CORONARY ANGIOGRAM N/A 09/12/2013   Procedure: LEFT HEART CATHETERIZATION WITH CORONARY ANGIOGRAM;  Surgeon: Troy Sine, MD;  Location: [facility] C[facility] LAB;  Service: Cardiovascular;  Laterality: N/A;   LUNG REMOVAL, PARTIAL     LEFT   TONSILLECTOMY      MEDICATIONS:  amLODipine (NORVASC) 5 MG tablet   Ascorbic Acid (VITAMIN C) 1000 MG tablet   aspirin 325 MG EC tablet   Cholecalciferol (VITAMIN D3) 2000 UNITS capsule   clopidogrel (PLAVIX) 75 MG tablet   cyanocobalamin (,VITAMIN B-12,) 1000 MCG/ML injection   DEXILANT 60 MG capsule   enalapril (VASOTEC) 10 MG tablet   ezetimibe (ZETIA) 10 MG tablet   fexofenadine (ALLEGRA) 180 MG tablet   ibuprofen (ADVIL,MOTRIN) 800 MG tablet   metoprolol succinate (TOPROL-XL) 25 MG 24 hr tablet   montelukast (SINGULAIR) 10 MG tablet   Multiple Vitamins-Minerals (MULTIVITAMIN PO)   omega-3 acid ethyl esters (LOVAZA) 1 G capsule   polyvinyl alcohol (ARTIFICIAL TEARS) 1.4 % ophthalmic solution   rosuvastatin (CRESTOR) 10 MG tablet   tadalafil (CIALIS) 20 MG tablet   zinc gluconate 50 MG tablet   No current facility-administered medications for this encounter.    Myra Gianotti, PA-C Surgical Short Stay/Anesthesiology [facility] Phone [phone] [place] Phone [phone] 01/23/2021 5:43 PM

## 2021-01-23 NOTE — Anesthesia Preprocedure Evaluation (Addendum)
Anesthesia Evaluation  Patient identified by MRN, date of birth, ID band Patient awake    Reviewed: Allergy & Precautions, NPO status , Patient's Chart, lab work & pertinent test results, reviewed documented beta blocker date and time   Airway Mallampati: II  TM Distance: >3 FB Neck ROM: Full    Dental  (+) Chipped, Dental Advisory Given,    Pulmonary sleep apnea and Continuous Positive Airway Pressure Ventilation ,    Pulmonary exam normal breath sounds clear to auscultation       Cardiovascular hypertension, Pt. on medications and Pt. on home beta blockers + angina with exertion + CAD, + Past MI and + Cardiac Stents   Rhythm:Regular Rate:Bradycardia  EKG 03/13/20 Sinus Bradycardia  Cardiac Cath 08/31/2018  The left ventricular systolic function is normal. The left ventricular ejection fraction is 55-65% by visual estimate.  LV end diastolic pressure is normal.  ----------------------------------------------  -- Previously placed Prox LAD to Mid LAD DES stent is widely patent.  -- Previously placed Ost 1st Diag-2 DES stent is 5% stenosed.  -- Previously placed Post Atrio DES stent is widely patent.  Hx/o cardiac stent placement after non STEMI 2007: DES to LAD and Diag;  b. 08/2011 non-ischemic myoview, EF 60%;  c. 08/2012 NSTEMI/Cath/PCI: LM n, LAD patent stent, D1 small, 80, D2 90 ISR (PTCA), LCX nl, RCA 20d, RPL 95 (2.25x12 Promus Premier DES), EF 55-65%.   Neuro/Psych negative neurological ROS  negative psych ROS   GI/Hepatic Neg liver ROS, GERD  Medicated and Controlled,Hx/o Barrett's esophagus Hx/o Colon Ca S/P sigmoid colectomy    Endo/Other  Hyperlipidemia  Renal/GU negative Renal ROS  negative genitourinary   Musculoskeletal negative musculoskeletal ROS (+) Incisional hernia   Abdominal   Peds  Hematology Plavix therapy-last dose   Anesthesia Other Findings   Reproductive/Obstetrics ED                           Anesthesia Physical Anesthesia Plan  ASA: 3  Anesthesia Plan: General   Post-op Pain Management:    Induction: Intravenous  PONV Risk Score and Plan: 4 or greater and Treatment may vary due to age or medical condition, Ondansetron and Midazolam  Airway Management Planned: LMA and Oral ETT  Additional Equipment: None  Intra-op Plan:   Post-operative Plan: Extubation in OR  Informed Consent: I have reviewed the patients History and Physical, chart, labs and discussed the procedure including the risks, benefits and alternatives for the proposed anesthesia with the patient or authorized representative who has indicated his/her understanding and acceptance.     Dental advisory given  Plan Discussed with: CRNA and Anesthesiologist  Anesthesia Plan Comments: (PAT note written 01/23/2021 by Myra Gianotti, PA-C. )      Anesthesia Quick Evaluation                                  Anesthesia Evaluation  Patient identified by MRN, date of birth, ID band Patient awake    Reviewed: Allergy & Precautions, NPO status , Patient's Chart, lab work & pertinent test results, reviewed documented beta blocker date and time   Airway Mallampati: III  TM Distance: >3 FB Neck ROM: Full    Dental  (+) Chipped,    Pulmonary sleep apnea and Continuous Positive Airway Pressure Ventilation ,    Pulmonary exam normal breath sounds clear to auscultation  Cardiovascular hypertension, Pt. on medications and Pt. on home beta blockers + CAD, + Past MI and + Cardiac Stents  Normal cardiovascular exam Rhythm:Regular Rate:Normal  ECG: SB, rate 49  Cardiologist is [employer]. Cleared for surgery at last office visit 04/30/17 with Cecilie Kicks, NP   Neuro/Psych negative neurological ROS  negative psych ROS   GI/Hepatic Neg liver ROS, hiatal hernia, GERD  Medicated and Controlled,  Endo/Other  negative endocrine ROS   Renal/GU negative Renal ROS     Musculoskeletal negative musculoskeletal ROS (+)   Abdominal   Peds  Hematology HLD   Anesthesia Other Findings  BILATERAL INGUINAL HERNIA  Reproductive/Obstetrics                            Anesthesia Physical Anesthesia Plan  ASA: III  Anesthesia Plan: General   Post-op Pain Management:    Induction: Intravenous  PONV Risk Score and Plan: 2 and Ondansetron, Dexamethasone and Midazolam  Airway Management Planned: Oral ETT  Additional Equipment:   Intra-op Plan:   Post-operative Plan: Extubation in OR  Informed Consent: I have reviewed the patients History and Physical, chart, labs and discussed the procedure including the risks, benefits and alternatives for the proposed anesthesia with the patient or authorized representative who has indicated his/her understanding and acceptance.   Dental advisory given  Plan Discussed with: CRNA  Anesthesia Plan Comments:        Anesthesia Quick Evaluation

## 2021-02-03 ENCOUNTER — Encounter (HOSPITAL_COMMUNITY): Payer: Self-pay | Admitting: Surgery

## 2021-02-03 NOTE — H&P (Signed)
PROVIDER: Beverlee Nims, MD  MRN: [id number] DOB: 08/22/1960 DATE OF ENCOUNTER: 01/06/2021 Subjective   Chief Complaint: Umbilical Hernia   History of Present Illness: [name] [name] is a 60 y.o. male who is seen today for a hernia  He is well-known to me. I performed bilateral laparoscopic inguinal hernias on him back in 2019. He has a previous surgical history of having had colon resection many years ago through a midline incision. He was recently stretching and felt a small pop above the umbilicus and noticed a bulge which he was able to reduce. Since then, he has had tenderness with only mild pain but no obstructive symptoms. He reports that his inguinal areas are doing well.  Review of Systems: A complete review of systems was obtained from the patient. I have reviewed this information and discussed as appropriate with the patient. See HPI as well for other ROS.  ROS   Medical History: Past Medical History:  Diagnosis Date   GERD (gastroesophageal reflux disease)   Heart valve disease   History of cancer   Sleep apnea   Patient Active Problem List  Diagnosis   Abrasion of ear region   Arthritis of both acromioclavicular joints   Esophageal reflux   Back pain   Malignant neoplasm of sigmoid colon (CMS-HCC)   Contusion of dorsum of hand   Coronary atherosclerosis of native coronary artery   Diaphragmatic hernia without mention of obstruction or gangrene   Hand crush injury, left, initial encounter   Hyperlipidemia, unspecified   Hypertension, benign   NSTEMI (non-ST elevated myocardial infarction) (CMS-HCC)   Obstructive sleep apnea   Personal history of malignant neoplasm of large intestine   Personal history of other diseases of digestive system   Rectal bleeding   Secondary malignant neoplasm of lung (CMS-HCC)   Past Surgical History:  Procedure Laterality Date   HERNIA REPAIR   JOINT REPLACEMENT  Knee    Allergies  Allergen Reactions   Shellfish  Containing Products Anaphylaxis, Other (See Comments) and Swelling  Large amounts of crab   Isosorbide Unknown   Levofloxacin Unknown  Other reaction(s): patient refused I am wirried about tendon and joint I am wirried about tendon and joint   Clarithromycin Diarrhea and Rash   No current outpatient medications on file prior to visit.   No current facility-administered medications on file prior to visit.   History reviewed. No pertinent family history.   Social History   Tobacco Use  Smoking Status Never Smoker  Smokeless Tobacco Never Used    Social History   Socioeconomic History   Marital status: Married  Tobacco Use   Smoking status: Never Smoker   Smokeless tobacco: Never Used  Scientific laboratory technician Use: Never used  Substance and Sexual Activity   Alcohol use: Not Currently   Drug use: Never   Objective:   Vitals:  01/06/21 1015  BP: 138/80  Pulse: 60  Temp: 36.8 C (98.2 F)  SpO2: 98%  Weight: 91.4 kg (201 lb 9.6 oz)  Height: 180.3 cm ('5\' 11"'$ )   Body mass index is 28.12 kg/m.  Physical Exam   He appears well on exam.  His abdomen is soft. He has a large well-healed midline incision from the xiphoid almost to the pubis. He does have a small fascial defect above and just to the left of the umbilicus at his previous midline incision. I am able to reduce the contents and the fascial defect is quite small. It is mildly  tender with minimal guarding  Labs, Imaging and Diagnostic Testing:  Assessment and Plan:  Diagnoses and all orders for this visit:  Incisional hernia, without obstruction or gangrene    I reviewed his notes in the electronic medical records including my previous notes. He does have a very small incisional hernia. We again discussed hernia repair in detail which he is well aware of having had previous hernia surgery. We discussed proceeding with a laparoscopic versus open repair. I believe the fascial defect is quite small we can proceed  with a small open repair with mesh as an outpatient versus a large laparoscopic repair. I discussed the surgical procedure with him in detail. We discussed the risk which includes but is not limited to bleeding, infection, injury to surrounding structures, the use of mesh, recurrent hernia, etc. We discussed cardiopulmonary issues. He will need to stop his Plavix for 5 days preoperatively and we will get cardiac clearance. Surgery will be scheduled after September 18 at his request the pending cardiac clearance.

## 2021-02-04 ENCOUNTER — Other Ambulatory Visit: Payer: Self-pay

## 2021-02-04 ENCOUNTER — Encounter (HOSPITAL_COMMUNITY): Payer: Self-pay | Admitting: Surgery

## 2021-02-04 ENCOUNTER — Ambulatory Visit (HOSPITAL_COMMUNITY)
Admission: RE | Admit: 2021-02-04 | Discharge: 2021-02-04 | Disposition: A | Discharge status: Home / Self Care | Payer: BC Managed Care – PPO | Source: Ambulatory Visit | Attending: Surgery | Admitting: Surgery

## 2021-02-04 ENCOUNTER — Ambulatory Visit (HOSPITAL_COMMUNITY): Payer: BC Managed Care – PPO | Admitting: Vascular Surgery

## 2021-02-04 ENCOUNTER — Ambulatory Visit (HOSPITAL_COMMUNITY): Payer: BC Managed Care – PPO | Admitting: Anesthesiology

## 2021-02-04 ENCOUNTER — Encounter (HOSPITAL_COMMUNITY)
Admission: RE | Disposition: A | Discharge status: Home / Self Care | Payer: Self-pay | Source: Ambulatory Visit | Attending: Surgery

## 2021-02-04 DIAGNOSIS — Z9049 Acquired absence of other specified parts of digestive tract: Secondary | ICD-10-CM | POA: Diagnosis not present

## 2021-02-04 DIAGNOSIS — Z881 Allergy status to other antibiotic agents status: Secondary | ICD-10-CM | POA: Diagnosis not present

## 2021-02-04 DIAGNOSIS — Z888 Allergy status to other drugs, medicaments and biological substances status: Secondary | ICD-10-CM | POA: Diagnosis not present

## 2021-02-04 DIAGNOSIS — K432 Incisional hernia without obstruction or gangrene: Secondary | ICD-10-CM | POA: Diagnosis present

## 2021-02-04 DIAGNOSIS — Z91013 Allergy to seafood: Secondary | ICD-10-CM | POA: Insufficient documentation

## 2021-02-04 HISTORY — PX: INSERTION OF MESH: SHX5868

## 2021-02-04 HISTORY — PX: INCISIONAL HERNIA REPAIR: SHX193

## 2021-02-04 SURGERY — REPAIR, HERNIA, INCISIONAL
Anesthesia: General | Site: Abdomen

## 2021-02-04 MED ORDER — BUPIVACAINE-EPINEPHRINE (PF) 0.25% -1:200000 IJ SOLN
INTRAMUSCULAR | Status: AC
  Filled 2021-02-04: qty 30

## 2021-02-04 MED ORDER — DEXAMETHASONE SODIUM PHOSPHATE 10 MG/ML IJ SOLN
INTRAMUSCULAR | Status: AC
  Filled 2021-02-04: qty 1

## 2021-02-04 MED ORDER — 0.9 % SODIUM CHLORIDE (POUR BTL) OPTIME
TOPICAL | Status: DC | PRN
  Administered 2021-02-04: 1000 mL

## 2021-02-04 MED ORDER — LACTATED RINGERS IV SOLN: INTRAVENOUS | Status: DC

## 2021-02-04 MED ORDER — FENTANYL CITRATE (PF) 100 MCG/2ML IJ SOLN
INTRAMUSCULAR | Status: DC | PRN
  Administered 2021-02-04: 100 ug via INTRAVENOUS

## 2021-02-04 MED ORDER — MIDAZOLAM HCL 5 MG/5ML IJ SOLN
INTRAMUSCULAR | Status: DC | PRN
  Administered 2021-02-04: 2 mg via INTRAVENOUS

## 2021-02-04 MED ORDER — CEFAZOLIN SODIUM-DEXTROSE 2-4 GM/100ML-% IV SOLN
2.0000 g | INTRAVENOUS | Status: AC
  Administered 2021-02-04: 2 g via INTRAVENOUS

## 2021-02-04 MED ORDER — ACETAMINOPHEN 500 MG PO TABS: 1000.0000 mg | ORAL_TABLET | ORAL | Status: AC

## 2021-02-04 MED ORDER — SUGAMMADEX SODIUM 500 MG/5ML IV SOLN
INTRAVENOUS | Status: DC | PRN
  Administered 2021-02-04: 400 mg via INTRAVENOUS

## 2021-02-04 MED ORDER — CHLORHEXIDINE GLUCONATE CLOTH 2 % EX PADS: 6.0000 | MEDICATED_PAD | Freq: Once | CUTANEOUS | Status: DC

## 2021-02-04 MED ORDER — ORAL CARE MOUTH RINSE: 15.0000 mL | Freq: Once | OROMUCOSAL | Status: AC

## 2021-02-04 MED ORDER — DEXAMETHASONE SODIUM PHOSPHATE 10 MG/ML IJ SOLN
INTRAMUSCULAR | Status: DC | PRN
  Administered 2021-02-04: 10 mg via INTRAVENOUS

## 2021-02-04 MED ORDER — PROPOFOL 10 MG/ML IV BOLUS
INTRAVENOUS | Status: DC | PRN
  Administered 2021-02-04: 150 mg via INTRAVENOUS

## 2021-02-04 MED ORDER — EPHEDRINE 5 MG/ML INJ
INTRAVENOUS | Status: AC
  Filled 2021-02-04: qty 5

## 2021-02-04 MED ORDER — ONDANSETRON HCL 4 MG/2ML IJ SOLN
INTRAMUSCULAR | Status: AC
  Filled 2021-02-04: qty 2

## 2021-02-04 MED ORDER — EPHEDRINE SULFATE 50 MG/ML IJ SOLN
INTRAMUSCULAR | Status: DC | PRN
  Administered 2021-02-04 (×2): 5 mg via INTRAVENOUS

## 2021-02-04 MED ORDER — ACETAMINOPHEN 500 MG PO TABS
ORAL_TABLET | ORAL | Status: AC
  Administered 2021-02-04: 1000 mg via ORAL
  Filled 2021-02-04: qty 2

## 2021-02-04 MED ORDER — LIDOCAINE 2% (20 MG/ML) 5 ML SYRINGE
INTRAMUSCULAR | Status: AC
  Filled 2021-02-04: qty 5

## 2021-02-04 MED ORDER — CHLORHEXIDINE GLUCONATE 0.12 % MT SOLN
OROMUCOSAL | Status: AC
  Administered 2021-02-04: 15 mL via OROMUCOSAL
  Filled 2021-02-04: qty 15

## 2021-02-04 MED ORDER — FENTANYL CITRATE (PF) 250 MCG/5ML IJ SOLN
INTRAMUSCULAR | Status: AC
  Filled 2021-02-04: qty 5

## 2021-02-04 MED ORDER — ROCURONIUM BROMIDE 100 MG/10ML IV SOLN
INTRAVENOUS | Status: DC | PRN
  Administered 2021-02-04: 50 mg via INTRAVENOUS

## 2021-02-04 MED ORDER — ONDANSETRON HCL 4 MG/2ML IJ SOLN
INTRAMUSCULAR | Status: DC | PRN
  Administered 2021-02-04: 4 mg via INTRAVENOUS

## 2021-02-04 MED ORDER — PROPOFOL 10 MG/ML IV BOLUS
INTRAVENOUS | Status: AC
  Filled 2021-02-04: qty 20

## 2021-02-04 MED ORDER — CHLORHEXIDINE GLUCONATE 0.12 % MT SOLN: 15.0000 mL | Freq: Once | OROMUCOSAL | Status: AC

## 2021-02-04 MED ORDER — BUPIVACAINE-EPINEPHRINE 0.25% -1:200000 IJ SOLN
INTRAMUSCULAR | Status: DC | PRN
  Administered 2021-02-04: 20 mL

## 2021-02-04 MED ORDER — ROCURONIUM BROMIDE 10 MG/ML (PF) SYRINGE
PREFILLED_SYRINGE | INTRAVENOUS | Status: AC
  Filled 2021-02-04: qty 10

## 2021-02-04 MED ORDER — LIDOCAINE HCL (CARDIAC) PF 100 MG/5ML IV SOSY
PREFILLED_SYRINGE | INTRAVENOUS | Status: DC | PRN
  Administered 2021-02-04: 40 mg via INTRAVENOUS

## 2021-02-04 MED ORDER — CEFAZOLIN SODIUM-DEXTROSE 2-4 GM/100ML-% IV SOLN
INTRAVENOUS | Status: AC
  Filled 2021-02-04: qty 100

## 2021-02-04 MED ORDER — MIDAZOLAM HCL 2 MG/2ML IJ SOLN
INTRAMUSCULAR | Status: AC
  Filled 2021-02-04: qty 2

## 2021-02-04 SURGICAL SUPPLY — 42 items
ADH SKN CLS APL DERMABOND .7 (GAUZE/BANDAGES/DRESSINGS) ×1
APL PRP STRL LF DISP 70% ISPRP (MISCELLANEOUS) ×1
BAG COUNTER SPONGE SURGICOUNT (BAG) ×2 IMPLANT
BAG SPNG CNTER NS LX DISP (BAG) ×1
BLADE CLIPPER SURG (BLADE) IMPLANT
CANISTER SUCT 3000ML PPV (MISCELLANEOUS) ×2 IMPLANT
CHLORAPREP W/TINT 26 (MISCELLANEOUS) ×2 IMPLANT
COVER SURGICAL LIGHT HANDLE (MISCELLANEOUS) ×2 IMPLANT
DERMABOND ADVANCED (GAUZE/BANDAGES/DRESSINGS) ×1
DERMABOND ADVANCED .7 DNX12 (GAUZE/BANDAGES/DRESSINGS) IMPLANT
DRAPE LAPAROSCOPIC ABDOMINAL (DRAPES) ×2 IMPLANT
DRSG TEGADERM 4X4.75 (GAUZE/BANDAGES/DRESSINGS) IMPLANT
ELECT REM PT RETURN 9FT ADLT (ELECTROSURGICAL) ×2
ELECTRODE REM PT RTRN 9FT ADLT (ELECTROSURGICAL) ×1 IMPLANT
GAUZE SPONGE 4X4 12PLY STRL (GAUZE/BANDAGES/DRESSINGS) IMPLANT
GLOVE SURG SIGNA 7.5 PF LTX (GLOVE) ×2 IMPLANT
GOWN STRL REUS W/ TWL LRG LVL3 (GOWN DISPOSABLE) ×1 IMPLANT
GOWN STRL REUS W/ TWL XL LVL3 (GOWN DISPOSABLE) ×1 IMPLANT
GOWN STRL REUS W/TWL LRG LVL3 (GOWN DISPOSABLE) ×2
GOWN STRL REUS W/TWL XL LVL3 (GOWN DISPOSABLE) ×2
KIT BASIN OR (CUSTOM PROCEDURE TRAY) ×2 IMPLANT
KIT TURNOVER KIT B (KITS) ×2 IMPLANT
MESH VENTRALEX ST 1-7/10 CRC S (Mesh General) ×1 IMPLANT
NDL HYPO 25GX1X1/2 BEV (NEEDLE) ×1 IMPLANT
NEEDLE HYPO 25GX1X1/2 BEV (NEEDLE) ×2 IMPLANT
NS IRRIG 1000ML POUR BTL (IV SOLUTION) ×2 IMPLANT
PACK GENERAL/GYN (CUSTOM PROCEDURE TRAY) ×2 IMPLANT
PAD ARMBOARD 7.5X6 YLW CONV (MISCELLANEOUS) ×2 IMPLANT
PENCIL SMOKE EVACUATOR (MISCELLANEOUS) ×1 IMPLANT
SPONGE T-LAP 18X18 ~~LOC~~+RFID (SPONGE) ×1 IMPLANT
STAPLER VISISTAT 35W (STAPLE) IMPLANT
SUT MNCRL AB 4-0 PS2 18 (SUTURE) ×1 IMPLANT
SUT NOVA NAB DX-16 0-1 5-0 T12 (SUTURE) IMPLANT
SUT NOVA NAB GS-21 0 18 T12 DT (SUTURE) ×1 IMPLANT
SUT NOVA NAB GS-21 1 T12 (SUTURE) ×1 IMPLANT
SUT PROLENE 1 CT (SUTURE) IMPLANT
SUT VIC AB 3-0 SH 27 (SUTURE) ×2
SUT VIC AB 3-0 SH 27XBRD (SUTURE) IMPLANT
SYR CONTROL 10ML LL (SYRINGE) ×1 IMPLANT
TOWEL GREEN STERILE (TOWEL DISPOSABLE) ×2 IMPLANT
TOWEL GREEN STERILE FF (TOWEL DISPOSABLE) ×2 IMPLANT
TRAY FOLEY MTR SLVR 14FR STAT (SET/KITS/TRAYS/PACK) IMPLANT

## 2021-02-04 NOTE — Anesthesia Procedure Notes (Signed)
Procedure Name: Intubation Date/Time: 02/04/2021 7:30 AM Performed by: Jonna Munro, CRNA Pre-anesthesia Checklist: Patient identified, Emergency Drugs available, Suction available, Patient being monitored and Timeout performed Patient Re-evaluated:Patient Re-evaluated prior to induction Oxygen Delivery Method: Circle system utilized Preoxygenation: Pre-oxygenation with 100% oxygen Induction Type: IV induction Ventilation: Mask ventilation without difficulty Laryngoscope Size: Mac and 4 Grade View: Grade I Tube type: Oral Tube size: 7.5 mm Number of attempts: 1 Airway Equipment and Method: Stylet Placement Confirmation: ETT inserted through vocal cords under direct vision, positive ETCO2 and breath sounds checked- equal and bilateral Secured at: 23 cm Tube secured with: Tape Dental Injury: Teeth and Oropharynx as per pre-operative assessment

## 2021-02-04 NOTE — Discharge Instructions (Signed)
[facility]  UMBILICAL OR INGUINAL HERNIA REPAIR: POST OP INSTRUCTIONS  Always review your discharge instruction sheet given to you by the facility where your surgery was performed. IF YOU HAVE DISABILITY OR FAMILY LEAVE FORMS, YOU MUST BRING THEM TO THE OFFICE FOR PROCESSING.   DO NOT GIVE THEM TO YOUR DOCTOR.  1. A  prescription for pain medication may be given to you upon discharge.  Take your pain medication as prescribed, if needed.  If narcotic pain medicine is not needed, then you may take acetaminophen (Tylenol) or ibuprofen (Advil) as needed. 2. Take your usually prescribed medications unless otherwise directed. If you need a refill on your pain medication, please contact your pharmacy.  They will contact our office to request authorization. Prescriptions will not be filled after 5 pm or on week-ends. 3. You should follow a light diet the first 24 hours after arrival home, such as soup and crackers, etc.  Be sure to include lots of fluids daily.  Resume your normal diet the day after surgery. 4.Most patients will experience some swelling and bruising around the umbilicus or in the groin and scrotum.  Ice packs and reclining will help.  Swelling and bruising can take several days to resolve.  6. It is common to experience some constipation if taking pain medication after surgery.  Increasing fluid intake and taking a stool softener (such as Colace) will usually help or prevent this problem from occurring.  A mild laxative (Milk of Magnesia or Miralax) should be taken according to package directions if there are no bowel movements after 48 hours. 7. Unless discharge instructions indicate otherwise, you may remove your bandages 24-48 hours after surgery, and you may shower at that time.  You may have steri-strips (small skin tapes) in place directly over the incision.  These strips should be left on the skin for 7-10 days.  If your surgeon used skin glue on the  incision, you may shower in 24 hours.  The glue will flake off over the next 2-3 weeks.  Any sutures or staples will be removed at the office during your follow-up visit. 8. ACTIVITIES:  You may resume regular (light) daily activities beginning the next day--such as daily self-care, walking, climbing stairs--gradually increasing activities as tolerated.  You may have sexual intercourse when it is comfortable.  Refrain from any heavy lifting or straining until approved by your doctor.  a.You may drive when you are no longer taking prescription pain medication, you can comfortably wear a seatbelt, and you can safely maneuver your car and apply brakes. b.RETURN TO WORK:   _____________________________________________  9.You should see your doctor in the office for a follow-up appointment approximately 2-3 weeks after your surgery.  Make sure that you call for this appointment within a day or two after you arrive home to insure a convenient appointment time. 10.OTHER INSTRUCTIONS: _OK TO SHOWER STARTING TOMORROW ICE PACK, TYLENOL, AND IBUPROFEN FOR PAIN NO LIFTING OVER 15 POUNDS FOR 4 WEEKS________________________    _____________________________________  WHEN TO CALL YOUR DOCTOR: Fever over 101.0 Inability to urinate Nausea and/or vomiting Extreme swelling or bruising Continued bleeding from incision. Increased pain, redness, or drainage from the incision  The clinic staff is available to answer your questions during regular business hours.  Please don't hesitate to call and ask to speak to one of the nurses for clinical concerns.  If you have a medical emergency, go to the nearest emergency room or call 911.  A surgeon  from [facility] is always on call at the hospital   [address] [place], [place], [place]  [address] ?  [address], [place], [place]   [address] [phone] ? [phone] ? FAX [phone] Web site: [email or web address]

## 2021-02-04 NOTE — Interval H&P Note (Signed)
History and Physical Interval Note: no change in H and P  02/04/2021 6:50 AM  [name] [name]  has presented today for surgery, with the diagnosis of INCISIONAL HERNIA.  The various methods of treatment have been discussed with the patient and family. After consideration of risks, benefits and other options for treatment, the patient has consented to  Procedure(s): OPEN INCISIONAL HERNIA REPAIR WITH MESH (N/A) as a surgical intervention.  The patient's history has been reviewed, patient examined, no change in status, stable for surgery.  I have reviewed the patient's chart and labs.  Questions were answered to the patient's satisfaction.     Coralie Keens

## 2021-02-04 NOTE — Op Note (Signed)
PATIENT:  [name] [name]  60 y.o. male  PRE-OPERATIVE DIAGNOSIS:  INCISIONAL HERNIA  POST-OPERATIVE DIAGNOSIS:  INCISIONAL HERNIA  PROCEDURE:  Procedure(s): INCISIONAL HERNIA REPAIR INSERTION OF MESH  SURGEON:  Surgeon(s): Coralie Keens, MD  ASSISTANT: Drucie Ip, MD  ANESTHESIA:   local and general  Indications for procedure: [name] is a 60 y.o. year old male with symptoms of pain, but no obstructive symptoms.  Description of procedure: The patient was brought into the operative suite. Anesthesia was administered with General endotracheal anesthesia. WHO checklist was applied. The patient was then placed in supine. The area was prepped and draped in the usual sterile fashion.  Next the infraumbilical skin was anesthetized with Marcaine with Epinephrine. A vertical supraumbilical incision was made slightly to the left over the hernia location. Cautery and blunt dissection was used to dissect down to the fascia. Pre-peritoneal fat was noted to come through the fascia. This was dissected free from surrounding tissues in 360 degrees. The hernia sac was not encountered or disrupted. The pre-peritoneal fat was ligated and the remainder of the contents reduced through the fascial defect.  The hernia defect was 1.5 cm in diameter.  Due to the size of the hernia, a 4.3 cm Bard mesh was inserted into the preperitoneal space. Care was taken to lay the mesh flat and sutured with 4 0 Novafil sutures in the cardinal directions. The fascial defect was then primarily closed with a figure-of-eight 0 Novafil suture.The deep dermal space was closed with a 3-0 vicryl. Marcaine with Epinephrine was injected into the muscle layer and around the fascia. The skin was closed with a 4-0 monocryl subcuticular suture. Dermabond was put in place for dressing. The patient awoke from anesthesia and was brought to pacu in stable condition. All counts were correct.  Findings: ventral hernia  Specimen:  none  Blood loss: minimal  Local anesthesia: 20 ml Marcaine with Epinephrine  Complications: none  PLAN OF CARE: Discharge to home after PACU  PATIENT DISPOSITION:  PACU - hemodynamically stable.  Drucie Ip, MD  02/04/2021 8:03 AM

## 2021-02-04 NOTE — Anesthesia Postprocedure Evaluation (Signed)
Anesthesia Post Note  Patient: [name] [name]  Procedure(s) Performed: INCISIONAL HERNIA REPAIR (Abdomen) INSERTION OF MESH (Abdomen)     Patient location during evaluation: PACU Anesthesia Type: General Level of consciousness: awake and alert and oriented Pain management: pain level controlled Vital Signs Assessment: post-procedure vital signs reviewed and stable Respiratory status: spontaneous breathing, nonlabored ventilation and respiratory function stable Cardiovascular status: blood pressure returned to baseline and stable Postop Assessment: no apparent nausea or vomiting Anesthetic complications: no   No notable events documented.  Last Vitals:  Vitals:   02/04/21 0842 02/04/21 0900  BP: 110/68 110/71  Pulse: (!) 49 (!) 49  Resp: 17 13  Temp:  (!) 36.4 C  SpO2: 100% 100%    Last Pain:  Vitals:   02/04/21 0900  TempSrc:   PainSc: 0-No pain                 Suni Jarnagin A.

## 2021-02-04 NOTE — Transfer of Care (Signed)
Immediate Anesthesia Transfer of Care Note  Patient: [name] [name]  Procedure(s) Performed: INCISIONAL HERNIA REPAIR (Abdomen) INSERTION OF MESH (Abdomen)  Patient Location: PACU  Anesthesia Type:General  Level of Consciousness: awake, alert , oriented and patient cooperative  Airway & Oxygen Therapy: Patient Spontanous Breathing and Patient connected to face mask oxygen  Post-op Assessment: Report given to RN, Post -op Vital signs reviewed and stable and Patient moving all extremities X 4  Post vital signs: Reviewed and stable  Last Vitals:  Vitals Value Taken Time  BP 118/78 02/04/21 0812  Temp    Pulse 75 02/04/21 0814  Resp 20 02/04/21 0814  SpO2 100 % 02/04/21 0814  Vitals shown include unvalidated device data.  Last Pain:  Vitals:   02/04/21 0613  TempSrc:   PainSc: 0-No pain      Patients Stated Pain Goal: 0 (19/37/90 2409)  Complications: No notable events documented.

## 2021-02-05 ENCOUNTER — Encounter (HOSPITAL_COMMUNITY): Payer: Self-pay | Admitting: Surgery

## 2021-02-05 NOTE — Addendum Note (Signed)
Addendum  created 02/05/21 1147 by Effie Berkshire, MD   Intraprocedure Staff edited

## 2021-02-24 ENCOUNTER — Ambulatory Visit (INDEPENDENT_AMBULATORY_CARE_PROVIDER_SITE_OTHER): Payer: BC Managed Care – PPO | Admitting: Dermatology

## 2021-02-24 ENCOUNTER — Other Ambulatory Visit: Payer: Self-pay

## 2021-02-24 ENCOUNTER — Encounter: Payer: Self-pay | Admitting: Dermatology

## 2021-02-24 DIAGNOSIS — Z1283 Encounter for screening for malignant neoplasm of skin: Secondary | ICD-10-CM

## 2021-02-24 DIAGNOSIS — L720 Epidermal cyst: Secondary | ICD-10-CM

## 2021-02-24 DIAGNOSIS — L570 Actinic keratosis: Secondary | ICD-10-CM

## 2021-02-24 DIAGNOSIS — D1801 Hemangioma of skin and subcutaneous tissue: Secondary | ICD-10-CM | POA: Diagnosis not present

## 2021-02-24 DIAGNOSIS — L821 Other seborrheic keratosis: Secondary | ICD-10-CM | POA: Diagnosis not present

## 2021-02-24 DIAGNOSIS — L817 Pigmented purpuric dermatosis: Secondary | ICD-10-CM

## 2021-02-24 NOTE — Progress Notes (Signed)
   Follow-Up Visit   Subjective  [name] [name] is a 60 y.o. male who presents for the following: Annual Exam (Few scaly spots on right temple and right forehead- scaly).  General skin examination, crusts on face and hard growth narrow high Location:  Duration:  Quality:  Associated Signs/Symptoms: Modifying Factors:  Severity:  Timing: Context:   Objective  Well appearing patient in no apparent distress; mood and affect are within normal limits.   A full examination was performed including scalp, head, eyes, ears, nose, lips, neck, chest, axillae, abdomen, back, buttocks, bilateral upper extremities, bilateral lower extremities, hands, feet, fingers, toes, fingernails, and toenails. All findings within normal limits unless otherwise noted below.  Areas beneath undergarment not examined   Assessment & Plan      Tender keratoses right sideburn and right temple treated with LN2 freeze (total #4).  Benign keratoses on the right shoulder and mid back can be left.  Multiple cherry angiomas.  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  Discussed biopsy but deferred.  I, Lavonna Monarch, MD, have reviewed all documentation for this visit.  The documentation on 02/24/21 for the exam, diagnosis, procedures, and orders are all accurate and complete.

## 2021-03-08 IMAGING — DX PORTABLE CHEST - 1 VIEW
1 series · 1 of 1 positions shown · non-contrast
Comparison: March 16, 2016

CLINICAL DATA: Pain and tachycardia

EXAM:
PORTABLE CHEST 1 VIEW

[chest]
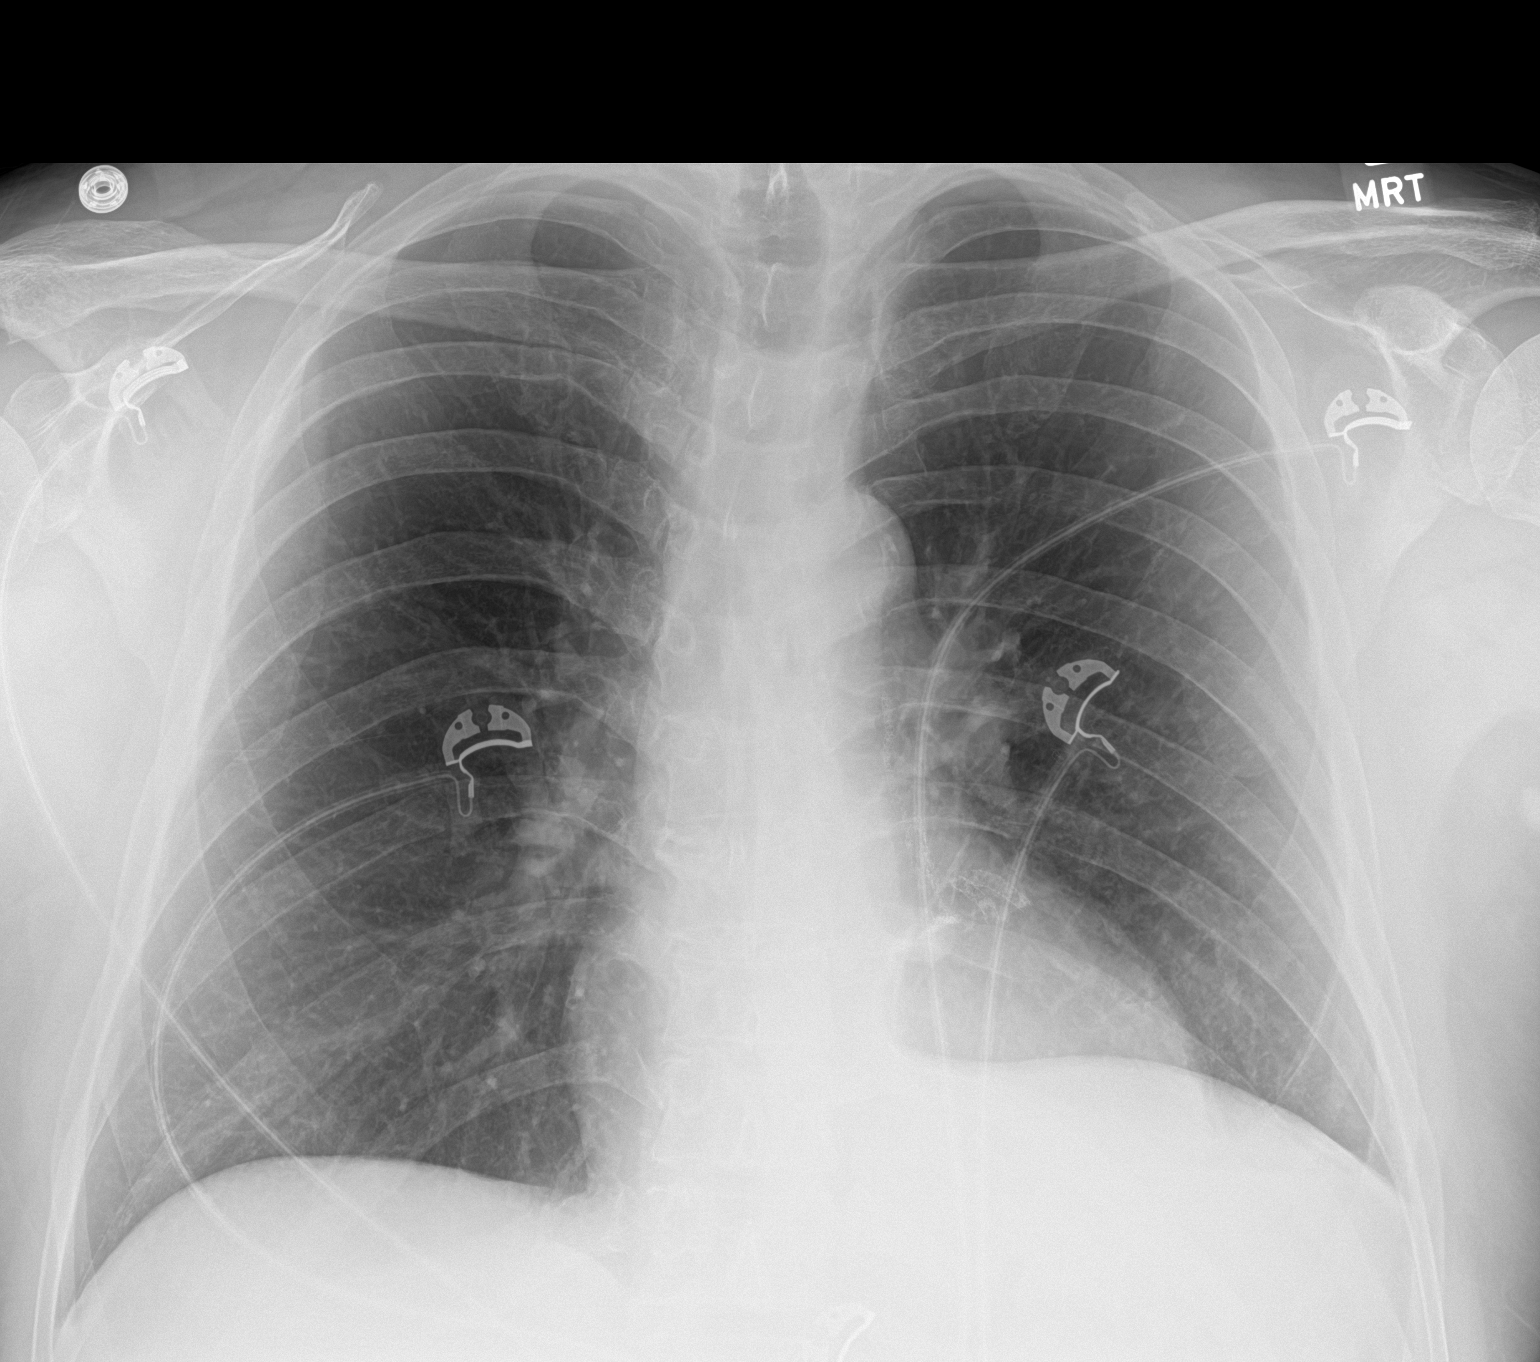

[1 of 1 positions shown; findings below may reference images not displayed]

FINDINGS: No edema or consolidation. Heart size and pulmonary vascularity are
normal. Stents are noted pulmonary arteries. There is aortic
atherosclerosis. No adenopathy. No bone lesions. No pneumothorax.
IMPRESSION: No edema or consolidation. Stable cardiac silhouette. Aortic
Atherosclerosis ([id number]).

## 2021-03-09 ENCOUNTER — Encounter: Payer: Self-pay | Admitting: Dermatology

## 2021-04-04 ENCOUNTER — Other Ambulatory Visit: Payer: Self-pay

## 2021-04-04 ENCOUNTER — Observation Stay (HOSPITAL_COMMUNITY)
Admission: EM | Admit: 2021-04-04 | Discharge: 2021-04-05 | Disposition: A | Discharge status: Home / Self Care | Payer: BC Managed Care – PPO | Attending: Internal Medicine | Admitting: Internal Medicine

## 2021-04-04 ENCOUNTER — Encounter (HOSPITAL_COMMUNITY): Payer: Self-pay | Admitting: Emergency Medicine

## 2021-04-04 ENCOUNTER — Emergency Department (HOSPITAL_COMMUNITY): Payer: BC Managed Care – PPO

## 2021-04-04 DIAGNOSIS — I2510 Atherosclerotic heart disease of native coronary artery without angina pectoris: Secondary | ICD-10-CM | POA: Insufficient documentation

## 2021-04-04 DIAGNOSIS — R0789 Other chest pain: Principal | ICD-10-CM | POA: Insufficient documentation

## 2021-04-04 DIAGNOSIS — E785 Hyperlipidemia, unspecified: Secondary | ICD-10-CM | POA: Diagnosis present

## 2021-04-04 DIAGNOSIS — Z7902 Long term (current) use of antithrombotics/antiplatelets: Secondary | ICD-10-CM | POA: Insufficient documentation

## 2021-04-04 DIAGNOSIS — Z79899 Other long term (current) drug therapy: Secondary | ICD-10-CM | POA: Diagnosis not present

## 2021-04-04 DIAGNOSIS — Z85038 Personal history of other malignant neoplasm of large intestine: Secondary | ICD-10-CM | POA: Insufficient documentation

## 2021-04-04 DIAGNOSIS — I10 Essential (primary) hypertension: Secondary | ICD-10-CM | POA: Diagnosis present

## 2021-04-04 DIAGNOSIS — Z85118 Personal history of other malignant neoplasm of bronchus and lung: Secondary | ICD-10-CM | POA: Diagnosis not present

## 2021-04-04 DIAGNOSIS — Z7982 Long term (current) use of aspirin: Secondary | ICD-10-CM | POA: Insufficient documentation

## 2021-04-04 DIAGNOSIS — Z955 Presence of coronary angioplasty implant and graft: Secondary | ICD-10-CM | POA: Insufficient documentation

## 2021-04-04 DIAGNOSIS — Z20822 Contact with and (suspected) exposure to covid-19: Secondary | ICD-10-CM | POA: Diagnosis not present

## 2021-04-04 DIAGNOSIS — M549 Dorsalgia, unspecified: Secondary | ICD-10-CM | POA: Diagnosis not present

## 2021-04-04 DIAGNOSIS — G4733 Obstructive sleep apnea (adult) (pediatric): Secondary | ICD-10-CM

## 2021-04-04 DIAGNOSIS — R079 Chest pain, unspecified: Secondary | ICD-10-CM

## 2021-04-04 LAB — CBC WITH DIFFERENTIAL/PLATELET
Abs Immature Granulocytes: 0.04 K/uL (ref 0.00–0.07)
Basophils Absolute: 0 K/uL (ref 0.0–0.1)
Basophils Relative: 0 %
Eosinophils Absolute: 0.1 K/uL (ref 0.0–0.5)
Eosinophils Relative: 1 %
HCT: 43 % (ref 39.0–52.0)
Hemoglobin: 14.4 g/dL (ref 13.0–17.0)
Immature Granulocytes: 0 %
Lymphocytes Relative: 16 %
Lymphs Abs: 1.6 K/uL (ref 0.7–4.0)
MCH: 30.4 pg (ref 26.0–34.0)
MCHC: 33.5 g/dL (ref 30.0–36.0)
MCV: 90.9 fL (ref 80.0–100.0)
Monocytes Absolute: 0.7 K/uL (ref 0.1–1.0)
Monocytes Relative: 7 %
Neutro Abs: 7.7 K/uL (ref 1.7–7.7)
Neutrophils Relative %: 76 %
Platelets: 238 K/uL (ref 150–400)
RBC: 4.73 MIL/uL (ref 4.22–5.81)
RDW: 12.9 % (ref 11.5–15.5)
WBC: 10.1 K/uL (ref 4.0–10.5)
nRBC: 0 % (ref 0.0–0.2)

## 2021-04-04 LAB — TROPONIN I (HIGH SENSITIVITY)
Troponin I (High Sensitivity): 3 ng/L
Troponin I (High Sensitivity): 4 ng/L

## 2021-04-04 LAB — COMPREHENSIVE METABOLIC PANEL WITH GFR
ALT: 21 U/L (ref 0–44)
AST: 20 U/L (ref 15–41)
Albumin: 3.8 g/dL (ref 3.5–5.0)
Alkaline Phosphatase: 63 U/L (ref 38–126)
Anion gap: 7 (ref 5–15)
BUN: 17 mg/dL (ref 6–20)
CO2: 25 mmol/L (ref 22–32)
Calcium: 9.3 mg/dL (ref 8.9–10.3)
Chloride: 106 mmol/L (ref 98–111)
Creatinine, Ser: 0.73 mg/dL (ref 0.61–1.24)
GFR, Estimated: >60 mL/min
Glucose, Bld: 111 mg/dL — ABNORMAL HIGH (ref 70–99)
Potassium: 3.9 mmol/L (ref 3.5–5.1)
Sodium: 138 mmol/L (ref 135–145)
Total Bilirubin: 0.5 mg/dL (ref 0.3–1.2)
Total Protein: 6.5 g/dL (ref 6.5–8.1)

## 2021-04-04 NOTE — ED Triage Notes (Signed)
Patient with tachycardia that he saw on his apple watch that went to 110.  Patient having back pain with it.  He denies any chest pain at this time, mild shortness of breath with exertion. Patient states that he does have some thoracic back pain with the tachycardia.

## 2021-04-04 NOTE — ED Provider Notes (Signed)
Emergency Medicine Provider Triage Evaluation Note  [name] [name] , a 60 y.o. male  was evaluated in triage.  Pt complains of upper thoracic back pain and tachycardia.  Patient reports upper thoracic back pain started yesterday evening.  Patient woke up today and has been having intermittent pain there throughout the day.  Patient reports that he had "some slight chest pain throughout the day."  Patient reports that thoracic back pain is similar to how he felt prior to his MI.  Patient reports that his Apple Watch has recorded heart rates into the 110s.  Review of Systems  Positive: Thoracic back pain, chest pain Negative: Shortness of breath, palpitations, leg swelling or tenderness, lightheadedness, syncope, abdominal pain, nausea, vomiting, diaphoresis  Physical Exam  BP (!) 171/91 (BP Location: Right Arm)   Pulse 78   Temp 98.7 F (37.1 C)   Resp 16   SpO2 98%  Gen:   Awake, no distress   Resp:  Normal effort, Lung clear to auscultation bilaterally. MSK:   Moves extremities without difficulty; no tenderness to thoracic or cervical back.  No swelling or tenderness to bilateral lower extremities. Other:  Abdomen soft, nondistended, nontender.  +2 radial pulse bilaterally.  Medical Decision Making  Medically screening exam initiated at 7:09 PM.  Appropriate orders placed.  [name] [name] was informed that the remainder of the evaluation will be completed by another provider, this initial triage assessment does not replace that evaluation, and the importance of remaining in the ED until their evaluation is complete.     Dyann Ruddle 04/04/21 1910    Milton Ferguson, MD 04/04/21 2213

## 2021-04-05 ENCOUNTER — Encounter (HOSPITAL_COMMUNITY): Payer: Self-pay | Admitting: Student

## 2021-04-05 DIAGNOSIS — I2510 Atherosclerotic heart disease of native coronary artery without angina pectoris: Secondary | ICD-10-CM | POA: Diagnosis not present

## 2021-04-05 DIAGNOSIS — E782 Mixed hyperlipidemia: Secondary | ICD-10-CM | POA: Diagnosis not present

## 2021-04-05 DIAGNOSIS — R079 Chest pain, unspecified: Secondary | ICD-10-CM | POA: Diagnosis not present

## 2021-04-05 DIAGNOSIS — I10 Essential (primary) hypertension: Secondary | ICD-10-CM | POA: Diagnosis not present

## 2021-04-05 DIAGNOSIS — Z9989 Dependence on other enabling machines and devices: Secondary | ICD-10-CM

## 2021-04-05 DIAGNOSIS — G4733 Obstructive sleep apnea (adult) (pediatric): Secondary | ICD-10-CM

## 2021-04-05 LAB — BRAIN NATRIURETIC PEPTIDE: B Natriuretic Peptide: 21.8 pg/mL (ref 0.0–100.0)

## 2021-04-05 LAB — D-DIMER, QUANTITATIVE: D-Dimer, Quant: <0.27 ug/mL-FEU (ref 0.00–0.50)

## 2021-04-05 LAB — RESP PANEL BY RT-PCR (FLU A&B, COVID) ARPGX2
Influenza A by PCR: NEGATIVE
Influenza B by PCR: NEGATIVE
SARS Coronavirus 2 by RT PCR: NEGATIVE

## 2021-04-05 LAB — TROPONIN I (HIGH SENSITIVITY): Troponin I (High Sensitivity): 4 ng/L (ref <18)

## 2021-04-05 LAB — HIV ANTIBODY (ROUTINE TESTING W REFLEX): HIV Screen 4th Generation wRfx: NONREACTIVE

## 2021-04-05 MED ORDER — ADULT MULTIVITAMIN W/MINERALS CH
1.0000 | ORAL_TABLET | Freq: Every day | ORAL | Status: DC
  Administered 2021-04-05: 1 via ORAL
  Filled 2021-04-05: qty 1

## 2021-04-05 MED ORDER — METOPROLOL SUCCINATE ER 25 MG PO TB24: 25.0000 mg | ORAL_TABLET | Freq: Every evening | ORAL | Status: DC

## 2021-04-05 MED ORDER — CLOPIDOGREL BISULFATE 75 MG PO TABS
75.0000 mg | ORAL_TABLET | Freq: Every day | ORAL | Status: DC
  Administered 2021-04-05: 75 mg via ORAL
  Filled 2021-04-05: qty 1

## 2021-04-05 MED ORDER — AMLODIPINE BESYLATE 5 MG PO TABS
5.0000 mg | ORAL_TABLET | Freq: Every day | ORAL | Status: DC
Start: 2021-04-05 — End: 2021-04-05
  Administered 2021-04-05: 5 mg via ORAL
  Filled 2021-04-05: qty 1

## 2021-04-05 MED ORDER — ACETAMINOPHEN 325 MG PO TABS: 650.0000 mg | ORAL_TABLET | ORAL | Status: DC | PRN

## 2021-04-05 MED ORDER — AMLODIPINE BESYLATE 5 MG PO TABS: 5.0000 mg | ORAL_TABLET | Freq: Two times a day (BID) | ORAL | Status: DC

## 2021-04-05 MED ORDER — MONTELUKAST SODIUM 10 MG PO TABS: 10.0000 mg | ORAL_TABLET | Freq: Every day | ORAL | Status: DC | PRN

## 2021-04-05 MED ORDER — CYANOCOBALAMIN 1000 MCG/ML IJ SOLN
1000.0000 ug | INTRAMUSCULAR | Status: DC
  Administered 2021-04-05: 1000 ug via INTRAMUSCULAR
  Filled 2021-04-05: qty 1

## 2021-04-05 MED ORDER — ASPIRIN 81 MG PO TBEC: 81.0000 mg | DELAYED_RELEASE_TABLET | Freq: Every day | ORAL | 11 refills | Status: DC

## 2021-04-05 MED ORDER — ZINC SULFATE 220 (50 ZN) MG PO CAPS
220.0000 mg | ORAL_CAPSULE | Freq: Every day | ORAL | Status: DC
  Administered 2021-04-05: 220 mg via ORAL
  Filled 2021-04-05: qty 1

## 2021-04-05 MED ORDER — ONDANSETRON HCL 4 MG/2ML IJ SOLN: 4.0000 mg | Freq: Four times a day (QID) | INTRAMUSCULAR | Status: DC | PRN

## 2021-04-05 MED ORDER — ASPIRIN 81 MG PO CHEW
324.0000 mg | CHEWABLE_TABLET | Freq: Once | ORAL | Status: AC
  Administered 2021-04-05: 324 mg via ORAL
  Filled 2021-04-05: qty 4

## 2021-04-05 MED ORDER — VITAMIN D 25 MCG (1000 UNIT) PO TABS
2000.0000 [IU] | ORAL_TABLET | Freq: Every day | ORAL | Status: DC
  Administered 2021-04-05: 2000 [IU] via ORAL
  Filled 2021-04-05: qty 2

## 2021-04-05 MED ORDER — ENALAPRIL MALEATE 5 MG PO TABS
10.0000 mg | ORAL_TABLET | Freq: Two times a day (BID) | ORAL | Status: DC
  Filled 2021-04-05: qty 2

## 2021-04-05 MED ORDER — ASCORBIC ACID 500 MG PO TABS
1000.0000 mg | ORAL_TABLET | Freq: Every day | ORAL | Status: DC
  Administered 2021-04-05: 1000 mg via ORAL
  Filled 2021-04-05: qty 2

## 2021-04-05 MED ORDER — ROSUVASTATIN CALCIUM 5 MG PO TABS
10.0000 mg | ORAL_TABLET | Freq: Every day | ORAL | Status: DC
  Administered 2021-04-05: 10 mg via ORAL
  Filled 2021-04-05: qty 2

## 2021-04-05 MED ORDER — ENALAPRIL MALEATE 5 MG PO TABS
10.0000 mg | ORAL_TABLET | Freq: Two times a day (BID) | ORAL | Status: DC
  Administered 2021-04-05: 10 mg via ORAL
  Filled 2021-04-05 (×2): qty 2

## 2021-04-05 MED ORDER — LORATADINE 10 MG PO TABS
10.0000 mg | ORAL_TABLET | Freq: Every day | ORAL | Status: DC
  Filled 2021-04-05: qty 1

## 2021-04-05 MED ORDER — ALPRAZOLAM 0.25 MG PO TABS
0.5000 mg | ORAL_TABLET | Freq: Three times a day (TID) | ORAL | Status: DC | PRN
  Administered 2021-04-05: 0.25 mg via ORAL
  Filled 2021-04-05: qty 2

## 2021-04-05 MED ORDER — OMEGA-3-ACID ETHYL ESTERS 1 G PO CAPS
2.0000 g | ORAL_CAPSULE | Freq: Two times a day (BID) | ORAL | Status: DC
  Administered 2021-04-05: 2 g via ORAL
  Filled 2021-04-05 (×3): qty 2

## 2021-04-05 MED ORDER — ASPIRIN EC 81 MG PO TBEC
81.0000 mg | DELAYED_RELEASE_TABLET | Freq: Every day | ORAL | Status: DC
  Administered 2021-04-05: 81 mg via ORAL
  Filled 2021-04-05: qty 1

## 2021-04-05 MED ORDER — EZETIMIBE 10 MG PO TABS
10.0000 mg | ORAL_TABLET | Freq: Every day | ORAL | Status: DC
  Administered 2021-04-05: 10 mg via ORAL
  Filled 2021-04-05: qty 1

## 2021-04-05 MED ORDER — ALUM & MAG HYDROXIDE-SIMETH 200-200-20 MG/5ML PO SUSP: 30.0000 mL | ORAL | Status: DC | PRN

## 2021-04-05 MED ORDER — ENALAPRIL MALEATE 5 MG PO TABS: 10.0000 mg | ORAL_TABLET | Freq: Every day | ORAL | Status: DC

## 2021-04-05 MED ORDER — ZINC GLUCONATE 50 MG PO TABS: 50.0000 mg | ORAL_TABLET | Freq: Every day | ORAL | Status: DC

## 2021-04-05 MED ORDER — PANTOPRAZOLE SODIUM 40 MG PO TBEC
40.0000 mg | DELAYED_RELEASE_TABLET | Freq: Every day | ORAL | Status: DC
  Administered 2021-04-05: 40 mg via ORAL
  Filled 2021-04-05: qty 1

## 2021-04-05 MED ORDER — ALPRAZOLAM 0.5 MG PO TABS: 0.5000 mg | ORAL_TABLET | Freq: Two times a day (BID) | ORAL | 0 refills | Status: DC | PRN

## 2021-04-05 MED ORDER — POLYVINYL ALCOHOL 1.4 % OP SOLN: 1.0000 [drp] | OPHTHALMIC | Status: DC | PRN

## 2021-04-05 MED ORDER — ENOXAPARIN SODIUM 40 MG/0.4ML IJ SOSY
40.0000 mg | PREFILLED_SYRINGE | Freq: Every day | INTRAMUSCULAR | Status: DC
  Administered 2021-04-05: 40 mg via SUBCUTANEOUS
  Filled 2021-04-05: qty 0.4

## 2021-04-05 NOTE — ED Provider Notes (Signed)
[facility] EMERGENCY DEPARTMENT Provider Note   CSN: [id number] Arrival date & time: 04/04/21  1851     History Chief Complaint  Patient presents with   Tachycardia    back   Back Pain    ICARUS PARTCH is a 60 y.o. male with a hx of CAD s/p remote PCI of PL in 2014 and prior stent of D2, hypertension, sleep apnea, dyslipidemia, and colon cancer in remission who presents to the ED with complaints of back pain intermittently since yesterday. Patient reports pressure to the mid to right thoracic region, lasts about 30 minutes at a time, also having some chest pressure intermittently that feels different- does not radiate around or through the back. No specific alleviating/aggravating factors. No specific change with exertion or deep breathing. Hx of similar pain with prior stent placement. Notes intermittent tachycardia on his watch with palpitations. Denies nausea, vomiting, diaphoresis, syncope,  leg pain/swelling, hemoptysis, recent surgery/trauma, hormone use, or hx of DVT/PE. Denies trauma or heavy lifting.     HPI     Past Medical History:  Diagnosis Date   ADENOCARCINOMA, SIGMOID COLON    a. s/p resection/chemo   Allergy    SEASONAL   Anginal pain ([place])    BARRETT'S ESOPHAGUS, HX OF    CAD    a. 2007: DES to LAD and Diag;  b. 08/2011 non-ischemic myoview, EF 60%;  c. 08/2012 NSTEMI/Cath/PCI: LM n, LAD patent stent, D1 small, 80, D2 90 ISR (PTCA), LCX nl, RCA 20d, RPL 95 (2.25x12 Promus Premier DES), EF 55-65%.   Diverticulosis    DYSLIPIDEMIA    GERD (gastroesophageal reflux disease)    HIATAL HERNIA WITH REFLUX    Hyperplastic colon polyp    HYPERTENSION    Myocardial infarction [facility])    Secondary malignant neoplasm of lung ([facility])    a. s/p wedge resection   Sleep apnea     Patient Active Problem List   Diagnosis Date Noted   Chest pain 08/30/2018   HTN (hypertension) 09/12/2013   CAD (coronary artery disease) 09/12/2013   Back pain  09/12/2013   HLD (hyperlipidemia) 09/12/2013   NSTEMI (non-ST elevated myocardial infarction) ([place]) 08/23/2012   GERD 06/28/2009   RECTAL BLEEDING 06/28/2009   PERSONAL HX COLON CANCER 06/28/2009   BARRETT'S ESOPHAGUS, HX OF 06/27/2009   SECONDARY MALIGNANT NEOPLASM OF LUNG 07/21/2007   DYSLIPIDEMIA 07/21/2007   Hypertension, essential 07/21/2007   Coronary atherosclerosis of native coronary artery 07/21/2007   SLEEP APNEA 07/21/2007   HIATAL HERNIA WITH REFLUX 03/28/2007   ADENOCARCINOMA, SIGMOID COLON 12/22/1996    Past Surgical History:  Procedure Laterality Date   COLON RESECTION  05/18/1996   COLON SURGERY     CORONARY ANGIOPLASTY     CORONARY STENT PLACEMENT  05/18/2005   taxus   INCISIONAL HERNIA REPAIR N/A 02/04/2021   Procedure: INCISIONAL HERNIA REPAIR;  Surgeon: Coralie Keens, MD;  Location: [facility];  Service: General;  Laterality: N/A;   INGUINAL HERNIA REPAIR N/A 05/31/2017   Procedure: BILATERAL LAPAROSCOPIC INGUINAL HERNIA REPAIR WITH MESH;  Surgeon: Coralie Keens, MD;  Location: [place];  Service: General;  Laterality: N/A;   INSERTION OF MESH Bilateral 05/31/2017   Procedure: INSERTION OF MESH;  Surgeon: Coralie Keens, MD;  Location: [place];  Service: General;  Laterality: Bilateral;   INSERTION OF MESH N/A 02/04/2021   Procedure: INSERTION OF MESH;  Surgeon: Coralie Keens, MD;  Location: [place];  Service: General;  Laterality: N/A;   KNEE  SURGERY     LEFT HEART CATH AND CORONARY ANGIOGRAPHY N/A 08/31/2018   Procedure: LEFT HEART CATH AND CORONARY ANGIOGRAPHY;  Surgeon: Leonie Man, MD;  Location: [place];  Service: Cardiovascular;  Laterality: N/A;   LEFT HEART CATHETERIZATION WITH CORONARY ANGIOGRAM N/A 08/22/2012   Procedure: LEFT HEART CATHETERIZATION WITH CORONARY ANGIOGRAM;  Surgeon: Peter M Martinique, MD;  Location: [facility] CATH LAB;  Service: Cardiovascular;  Laterality: N/A;   LEFT HEART CATHETERIZATION WITH CORONARY ANGIOGRAM N/A  09/12/2013   Procedure: LEFT HEART CATHETERIZATION WITH CORONARY ANGIOGRAM;  Surgeon: Troy Sine, MD;  Location: [facility] CATH LAB;  Service: Cardiovascular;  Laterality: N/A;   LUNG REMOVAL, PARTIAL     LEFT   TONSILLECTOMY         Family History  Problem Relation Age of Onset   Heart disease Mother        alive @ 33.  s/p cabg   Colon polyps Mother    Heart disease Father        alive @ 15.  s/p stenting   Heart attack Paternal Uncle        @ 58   Heart attack Paternal Uncle        @ 18   Colon cancer Neg Hx     Social History   Tobacco Use   Smoking status: Never   Smokeless tobacco: Never  Vaping Use   Vaping Use: Never used  Substance Use Topics   Alcohol use: No   Drug use: No    Home Medications Prior to Admission medications   Medication Sig Start Date End Date Taking? Authorizing Provider  amLODipine (NORVASC) 5 MG tablet Take 5 mg by mouth 2 (two) times daily.     [provider]  Ascorbic Acid (VITAMIN C) 1000 MG tablet Take 1,000 mg by mouth daily.    [provider]  aspirin 325 MG EC tablet Take 325 mg by mouth daily.    [provider]  Cholecalciferol (VITAMIN D3) 2000 UNITS capsule Take 2,000 Units by mouth daily.  01/29/12   [provider]  clopidogrel (PLAVIX) 75 MG tablet Take 1 tablet (75 mg total) by mouth daily. 08/23/12   Theora Gianotti, NP  cyanocobalamin (,VITAMIN B-12,) 1000 MCG/ML injection Inject 1,000 mcg into the muscle every 30 (thirty) days.  11/23/14   [provider]  DEXILANT 60 MG capsule Take 60 mg by mouth daily.  07/19/11   [provider]  enalapril (VASOTEC) 10 MG tablet Take 10 mg by mouth 2 (two) times daily.    [provider]  ezetimibe (ZETIA) 10 MG tablet Take 10 mg by mouth daily.    [provider]  fexofenadine (ALLEGRA) 180 MG tablet Take 180 mg by mouth daily as needed for allergies or rhinitis.    [provider]  ibuprofen  (ADVIL,MOTRIN) 800 MG tablet Take 800 mg by mouth every 8 (eight) hours as needed for headache or moderate pain.    [provider]  metoprolol succinate (TOPROL-XL) 25 MG 24 hr tablet Take 25 mg by mouth every evening.     [provider]  montelukast (SINGULAIR) 10 MG tablet Take 10 mg by mouth daily as needed (for allergies).    [provider]  Multiple Vitamins-Minerals (MULTIVITAMIN PO) Take 1 tablet by mouth daily.    [provider]  omega-3 acid ethyl esters (LOVAZA) 1 G capsule Take 2 g by mouth 2 (two) times daily.  [provider]  polyvinyl alcohol (LIQUIFILM TEARS) 1.4 % ophthalmic solution Place 1 drop into both eyes as needed for dry eyes.    [provider]  rosuvastatin (CRESTOR) 10 MG tablet Take 10 mg by mouth daily.    [provider]  tadalafil (CIALIS) 20 MG tablet Take 20 mg by mouth daily as needed for erectile dysfunction. 11/17/20   [provider]  zinc gluconate 50 MG tablet Take 50 mg by mouth daily.    [provider]    Allergies    Shellfish-derived products, Isosorbide, Levofloxacin, and Clarithromycin  Review of Systems   Review of Systems  Constitutional:  Negative for chills, diaphoresis and fever.  Respiratory:  Negative for shortness of breath.   Cardiovascular:  Positive for chest pain and palpitations.  Gastrointestinal:  Negative for abdominal pain, nausea and vomiting.  Musculoskeletal:  Positive for back pain.  Neurological:  Negative for syncope, weakness and numbness.  All other systems reviewed and are negative.  Physical Exam Updated Vital Signs BP (!) 152/84   Pulse (!) 56   Temp 98.7 F (37.1 C)   Resp 16   SpO2 100%   Physical Exam Vitals and nursing note reviewed.  Constitutional:      General: He is not in acute distress.    Appearance: He is well-developed. He is not toxic-appearing.  HENT:     Head: Normocephalic and atraumatic.  Eyes:      General:        Right eye: No discharge.        Left eye: No discharge.     Conjunctiva/sclera: Conjunctivae normal.  Cardiovascular:     Rate and Rhythm: Normal rate and regular rhythm.     Pulses:          Radial pulses are 2+ on the right side and 2+ on the left side.       Popliteal pulses are 2+ on the right side and 2+ on the left side.  Pulmonary:     Effort: Pulmonary effort is normal. No respiratory distress.     Breath sounds: Normal breath sounds. No wheezing, rhonchi or rales.  Chest:     Chest wall: No tenderness.  Abdominal:     General: There is no distension.     Palpations: Abdomen is soft.     Tenderness: There is no abdominal tenderness. There is no guarding or rebound.  Musculoskeletal:        General: No tenderness.     Cervical back: Neck supple.     Right lower leg: No edema.     Left lower leg: No edema.     Comments: No point/focal vertebral tenderness or step off.   Skin:    General: Skin is warm and dry.     Findings: No rash.  Neurological:     Mental Status: He is alert.     Comments: Clear speech. Sensation grossly intact to bilateral upper/lower extremities. 5/5 symmetric grip strength & strength with plantar/dorsiflexion bilaterally.   Psychiatric:        Behavior: Behavior normal.    ED Results / Procedures / Treatments   Labs (all labs ordered are listed, but only abnormal results are displayed) Labs Reviewed  COMPREHENSIVE METABOLIC PANEL - Abnormal; Notable for the following components:      Result Value   Glucose, Bld 111 (*)    All other components within normal limits  CBC WITH DIFFERENTIAL/PLATELET  TROPONIN I (HIGH SENSITIVITY)  TROPONIN I (HIGH SENSITIVITY)    EKG None  Radiology DG Chest 2 View  Result Date: 04/04/2021 CLINICAL DATA:  Chest pain and tachycardia EXAM: CHEST - 2 VIEW COMPARISON:  08/30/2018 FINDINGS: Cardiac shadow is within normal limits. Lungs are well aerated bilaterally. Minimal scarring is noted in  the left base as well as prior postsurgical changes. Mild volume loss on the left is noted. No bony abnormality is seen. IMPRESSION: Postsurgical changes with mild volume loss. No acute abnormality noted. Electronically Signed   By: Inez Catalina M.D.   On: 04/04/2021 20:17    Procedures Procedures   Medications Ordered in ED Medications - No data to display  ED Course  I have reviewed the triage vital signs and the nursing notes.  Pertinent labs & imaging results that were available during my care of the patient were reviewed by me and considered in my medical decision making (see chart for details).    MDM Rules/Calculators/A&P                           Patient presents to the ED with complaints of back pain & chest pain  EKG: no STEMI.   Additional history obtained:  Additional history obtained from chart review & nursing note review.  08/2018- Presented to the ED with exertional thoracic pain and was admitted- Left heart cath:  SUMMARY Widely patent LAD-diagonal and PLA stents. Otherwise minimal CAD. Normal EF and EDP.  Lab Tests:  I Ordered, reviewed, and interpreted labs, which included:  CBC/CMP: Unremarkable Troponins: Flat Imaging Studies ordered:  CXR ordered in triage- I independently reviewed, formal radiology impression shows: Postsurgical changes with mild volume loss. No acute abnormality noted  ED Course:  Low risk wells, not hypoxic/tachycardic in the ED- feel PE is less likely. No widened mediastinum on CXR, symmetric pulses, low suspicion for dissection. CXR & Labs reassuring. Patient reports same sxs w/ prior stent placement, EKG & troponins reassuring today. He is adamant that he does not feel safe going home, given his hx of CAD w/ PCI and reported similar sxs-feel this is reasonable. Aspirin ordered & consult placed to hospitalist service.   Discussed w/ hospitalist Dr. Myna Hidalgo- accepts admission.    Findings and plan of care discussed with supervising  physician Dr. Dayna Barker who is in agreement.   Portions of this note were generated with [employer]. Dictation errors may occur despite best attempts at proofreading.  Final Clinical Impression(s) / ED Diagnoses Final diagnoses:  Chest pain, unspecified type    Rx / DC Orders ED Discharge Orders     None        Amaryllis Dyke, PA-C 04/05/21 3291    Merrily Pew, MD 04/06/21 0107

## 2021-04-05 NOTE — Discharge Summary (Signed)
Physician Discharge Summary  [name] [name] RDE:[id number] DOB: 1961/02/11 DOA: 04/04/2021  PCP: [name] Sos, MD  Admit date: 04/04/2021 Discharge date: 04/05/2021  Admitted From: Home  Disposition:   Home   Recommendations for Outpatient Follow-up and new medication changes:  Follow up with [name] [name] in 7 to 10 days.  Added low dose alprazolam for anxiety as needed   Home Health: no   Equipment/Devices: no    Discharge Condition: stable  CODE STATUS: full  Diet recommendation:  heart healthy   Brief/Interim Summary: [name] [name] was admitted to the hospital with the working diagnosis of atypical chest pain, ruled out of acute coronary syndrome.    60 year old male past medical history for coronary artery disease status post stenting, Barrett esophagus, hypertension, obstructive sleep apnea and history of colon cancer in remission who presented with back pain, chest discomfort and elevated resting heart rate.  He reported dull ache in his midline thoracic back on 04/03/2021, intermittent pain with no improving or worsening factors.  He also complained chest pain, similar to his symptoms when he had a myocardial infarction in the past. No angina, PND or orthopnea.   He decided to come to the hospital, his blood pressure was 152/84, heart rate 56, respiratory rate 16, oxygen saturation 98%, his lungs were clear to auscultation bilaterally, heart S1-S2, present, rhythmic, soft abdomen, no lower extremity edema.   Sodium 138, potassium 3.9, chloride 106, bicarb 25, glucose 111, BUN 17, creatinine 0.73, high sensitive troponin 4-4-3.  White count 10.1, hemoglobin 14.4, hematocrit 43.0, platelets 238. D-dimer <0.27 SARS COVID-19 negative   Chest radiograph no infiltrates.   EKG 84 bpm, normal axis, normal intervals, sinus rhythm, Q-wave lead III-aVF (chronic), no significant ST segment or T wave changes.  Atypical chest pain, ruled out for acute coronary syndrome/coronary artery  disease/dyslipidemia.  Patient had no anginal symptoms, his cardiac markers and electrocardiogram were negative for acute ischemic event.  Old records personally reviewed, he had a left heart catheterization with coronary angiography on 08/31/2018, with widely patent LAD/diagonal and PLA stents.  Otherwise minimal coronary artery disease.  Patient will continue his medical regimen for coronary artery disease including clopidogrel, aspirin, rosuvastatin, enalapril and metoprolol. She has ruled out for acute coronary syndrome.  2.  Hypertension.  Continue blood pressure control metoprolol, enalapril and amlodipine.  3.  Barrett's esophagus.  Continue antiacid therapy with proton pump inhibitor.  4.  Obstructive sleep apnea.  Continue home CPAP.  5.  Anxiety.  Patient showed tremors and signs of anxiety, received alprazolam with significant improvement of symptoms. He will continue taking low-dose alprazolam as needed, follow-up with Dr. Shon [name] in the outpatient office.  Discharge Diagnoses:  Principal Problem:   Chest pain Active Problems:   OSA on CPAP   HTN (hypertension)   CAD (coronary artery disease)   HLD (hyperlipidemia)    Discharge Instructions   Allergies as of 04/05/2021       Reactions   Shellfish-derived Products Anaphylaxis, Swelling, Other (See Comments)   Large amounts of crab   Isosorbide Other (See Comments)   Pt unsure    Levofloxacin Other (See Comments)   Other reaction(s): patient refused I am wirried about tendon and joint   Clarithromycin Diarrhea, Rash        Medication List     TAKE these medications    ALPRAZolam 0.5 MG tablet Commonly known as: XANAX Take 1 tablet (0.5 mg total) by mouth 2 (two) times daily as needed for anxiety.  amLODipine 5 MG tablet Commonly known as: NORVASC Take 5 mg by mouth 2 (two) times daily.   aspirin 81 MG EC tablet Take 1 tablet (81 mg total) by mouth daily. Swallow whole. Start taking on: April 06, 2021 What changed:  medication strength how much to take additional instructions   clopidogrel 75 MG tablet Commonly known as: PLAVIX Take 1 tablet (75 mg total) by mouth daily.   cyanocobalamin 1000 MCG/ML injection Commonly known as: (VITAMIN B-12) Inject 1,000 mcg into the muscle every 30 (thirty) days.   Dexilant 60 MG capsule Generic drug: dexlansoprazole Take 60 mg by mouth daily.   enalapril 10 MG tablet Commonly known as: VASOTEC Take 10 mg by mouth 2 (two) times daily.   ezetimibe 10 MG tablet Commonly known as: ZETIA Take 10 mg by mouth daily.   fexofenadine 180 MG tablet Commonly known as: ALLEGRA Take 180 mg by mouth daily as needed for allergies or rhinitis.   ibuprofen 800 MG tablet Commonly known as: ADVIL Take 800 mg by mouth every 8 (eight) hours as needed for headache or moderate pain.   metoprolol succinate 25 MG 24 hr tablet Commonly known as: TOPROL-XL Take 25 mg by mouth daily.   montelukast 10 MG tablet Commonly known as: SINGULAIR Take 10 mg by mouth daily as needed (for allergies).   MULTIVITAMIN PO Take 1 tablet by mouth daily.   omega-3 acid ethyl esters 1 g capsule Commonly known as: LOVAZA Take 2 g by mouth 2 (two) times daily.   polyvinyl alcohol 1.4 % ophthalmic solution Commonly known as: LIQUIFILM TEARS Place 1 drop into both eyes as needed for dry eyes.   rosuvastatin 10 MG tablet Commonly known as: CRESTOR Take 10 mg by mouth daily.   tadalafil 20 MG tablet Commonly known as: CIALIS Take 20 mg by mouth daily as needed for erectile dysfunction.   vitamin C 1000 MG tablet Take 1,000 mg by mouth daily.   Vitamin D3 50 MCG (2000 UT) capsule Take 2,000 Units by mouth daily.   zinc gluconate 50 MG tablet Take 50 mg by mouth daily.        Allergies  Allergen Reactions   Shellfish-Derived Products Anaphylaxis, Swelling and Other (See Comments)    Large amounts of crab   Isosorbide Other (See Comments)    Pt  unsure    Levofloxacin Other (See Comments)    Other reaction(s): patient refused I am wirried about tendon and joint   Clarithromycin Diarrhea and Rash     Procedures/Studies: DG Chest 2 View  Result Date: 04/04/2021 CLINICAL DATA:  Chest pain and tachycardia EXAM: CHEST - 2 VIEW COMPARISON:  08/30/2018 FINDINGS: Cardiac shadow is within normal limits. Lungs are well aerated bilaterally. Minimal scarring is noted in the left base as well as prior postsurgical changes. Mild volume loss on the left is noted. No bony abnormality is seen. IMPRESSION: Postsurgical changes with mild volume loss. No acute abnormality noted. Electronically Signed   By: Inez Catalina M.D.   On: 04/04/2021 20:17       Subjective: He is feeling better, no chest pain, no back pain, no nausea no vomiting, alprazolam had significantly improve his symptoms.  Discharge Exam: Vitals:   04/05/21 1130 04/05/21 1215  BP: 125/74 139/80  Pulse: 63 62  Resp: 18 10  Temp:    SpO2: 98% 98%   Vitals:   04/05/21 1030 04/05/21 1100 04/05/21 1130 04/05/21 1215  BP: 131/90 139/81 125/74 139/80  Pulse: (!) 59 63 63 62  Resp: 11 20 18 10   Temp:      TempSrc:      SpO2: 99% 97% 98% 98%    General: Not in pain or dyspnea  Neurology: Awake and alert, non focal  E ENT: no pallor, no icterus, oral mucosa moist Cardiovascular: No JVD. S1-S2 present, rhythmic, no gallops, rubs, or murmurs. No lower extremity edema. Pulmonary: vesicular breath sounds bilaterally, adequate air movement, no wheezing, rhonchi or rales. Gastrointestinal. Abdomen soft and non tender Skin. No rashes Musculoskeletal: no joint deformities   The results of significant diagnostics from this hospitalization (including imaging, microbiology, ancillary and laboratory) are listed below for reference.     Microbiology: Recent Results (from the past 240 hour(s))  Resp Panel by RT-PCR (Flu A&B, Covid) Nasopharyngeal Swab     Status: None   Collection  Time: 04/05/21  7:37 AM   Specimen: Nasopharyngeal Swab; Nasopharyngeal(NP) swabs in vial transport medium  Result Value Ref Range Status   SARS Coronavirus 2 by RT PCR NEGATIVE NEGATIVE Final    Comment: (NOTE) SARS-CoV-2 target nucleic acids are NOT DETECTED.  The SARS-CoV-2 RNA is generally detectable in upper respiratory specimens during the acute phase of infection. The lowest concentration of SARS-CoV-2 viral copies this assay can detect is 138 copies/mL. A negative result does not preclude SARS-Cov-2 infection and should not be used as the sole basis for treatment or other patient management decisions. A negative result may occur with  improper specimen collection/handling, submission of specimen other than nasopharyngeal swab, presence of viral mutation(s) within the areas targeted by this assay, and inadequate number of viral copies(<138 copies/mL). A negative result must be combined with clinical observations, patient history, and epidemiological information. The expected result is Negative.  Fact Sheet for Patients:  EntrepreneurPulse.com.au  Fact Sheet for Healthcare Providers:  IncredibleEmployment.be  This test is no t yet approved or cleared by the [place] FDA and  has been authorized for detection and/or diagnosis of SARS-CoV-2 by FDA under an Emergency Use Authorization (EUA). This EUA will remain  in effect (meaning this test can be used) for the duration of the COVID-19 declaration under Section 564(b)(1) of the Act, 21 U.S.C.section 360bbb-3(b)(1), unless the authorization is terminated  or revoked sooner.       Influenza A by PCR NEGATIVE NEGATIVE Final   Influenza B by PCR NEGATIVE NEGATIVE Final    Comment: (NOTE) The Xpert Xpress SARS-CoV-2/FLU/RSV plus assay is intended as an aid in the diagnosis of influenza from Nasopharyngeal swab specimens and should not be used as a sole basis for treatment. Nasal washings  and aspirates are unacceptable for Xpert Xpress SARS-CoV-2/FLU/RSV testing.  Fact Sheet for Patients: EntrepreneurPulse.com.au  Fact Sheet for Healthcare Providers: IncredibleEmployment.be  This test is not yet approved or cleared by the [place] FDA and has been authorized for detection and/or diagnosis of SARS-CoV-2 by FDA under an Emergency Use Authorization (EUA). This EUA will remain in effect (meaning this test can be used) for the duration of the COVID-19 declaration under Section 564(b)(1) of the Act, 21 U.S.C. section 360bbb-3(b)(1), unless the authorization is terminated or revoked.  Performed at [facility], [place] [address], [place], [place] [address]      Labs: BNP (last 3 results) Recent Labs    04/05/21 0649  BNP 66.0   Basic Metabolic Panel: Recent Labs  Lab 04/04/21 1930  NA 138  K 3.9  CL 106  CO2 25  GLUCOSE 111*  BUN 17  CREATININE 0.73  CALCIUM 9.3   Liver Function Tests: Recent Labs  Lab 04/04/21 1930  AST 20  ALT 21  ALKPHOS 63  BILITOT 0.5  PROT 6.5  ALBUMIN 3.8   No results for input(s): LIPASE, AMYLASE in the last 168 hours. No results for input(s): AMMONIA in the last 168 hours. CBC: Recent Labs  Lab 04/04/21 1930  WBC 10.1  NEUTROABS 7.7  HGB 14.4  HCT 43.0  MCV 90.9  PLT 238   Cardiac Enzymes: No results for input(s): CKTOTAL, CKMB, CKMBINDEX, TROPONINI in the last 168 hours. BNP: Invalid input(s): POCBNP CBG: No results for input(s): GLUCAP in the last 168 hours. D-Dimer Recent Labs    04/05/21 0649  DDIMER <0.27   Hgb A1c No results for input(s): HGBA1C in the last 72 hours. Lipid Profile No results for input(s): CHOL, HDL, LDLCALC, TRIG, CHOLHDL, LDLDIRECT in the last 72 hours. Thyroid function studies No results for input(s): TSH, T4TOTAL, T3FREE, THYROIDAB in the last 72 hours.  Invalid input(s): FREET3 Anemia work up No results for input(s):  VITAMINB12, FOLATE, FERRITIN, TIBC, IRON, RETICCTPCT in the last 72 hours. Urinalysis No results found for: COLORURINE, APPEARANCEUR, [place], [place], GLUCOSEU, [place], [place], [place], PROTEINUR, UROBILINOGEN, NITRITE, LEUKOCYTESUR Sepsis Labs Invalid input(s): PROCALCITONIN,  WBC,  LACTICIDVEN Microbiology Recent Results (from the past 240 hour(s))  Resp Panel by RT-PCR (Flu A&B, Covid) Nasopharyngeal Swab     Status: None   Collection Time: 04/05/21  7:37 AM   Specimen: Nasopharyngeal Swab; Nasopharyngeal(NP) swabs in vial transport medium  Result Value Ref Range Status   SARS Coronavirus 2 by RT PCR NEGATIVE NEGATIVE Final    Comment: (NOTE) SARS-CoV-2 target nucleic acids are NOT DETECTED.  The SARS-CoV-2 RNA is generally detectable in upper respiratory specimens during the acute phase of infection. The lowest concentration of SARS-CoV-2 viral copies this assay can detect is 138 copies/mL. A negative result does not preclude SARS-Cov-2 infection and should not be used as the sole basis for treatment or other patient management decisions. A negative result may occur with  improper specimen collection/handling, submission of specimen other than nasopharyngeal swab, presence of viral mutation(s) within the areas targeted by this assay, and inadequate number of viral copies(<138 copies/mL). A negative result must be combined with clinical observations, patient history, and epidemiological information. The expected result is Negative.  Fact Sheet for Patients:  EntrepreneurPulse.com.au  Fact Sheet for Healthcare Providers:  IncredibleEmployment.be  This test is no t yet approved or cleared by the [place] FDA and  has been authorized for detection and/or diagnosis of SARS-CoV-2 by FDA under an Emergency Use Authorization (EUA). This EUA will remain  in effect (meaning this test can be used) for the duration of the COVID-19  declaration under Section 564(b)(1) of the Act, 21 U.S.C.section 360bbb-3(b)(1), unless the authorization is terminated  or revoked sooner.       Influenza A by PCR NEGATIVE NEGATIVE Final   Influenza B by PCR NEGATIVE NEGATIVE Final    Comment: (NOTE) The Xpert Xpress SARS-CoV-2/FLU/RSV plus assay is intended as an aid in the diagnosis of influenza from Nasopharyngeal swab specimens and should not be used as a sole basis for treatment. Nasal washings and aspirates are unacceptable for Xpert Xpress SARS-CoV-2/FLU/RSV testing.  Fact Sheet for Patients: EntrepreneurPulse.com.au  Fact Sheet for Healthcare Providers: IncredibleEmployment.be  This test is not yet approved or cleared by the [place] FDA and has been authorized for detection and/or diagnosis  of SARS-CoV-2 by FDA under an Emergency Use Authorization (EUA). This EUA will remain in effect (meaning this test can be used) for the duration of the COVID-19 declaration under Section 564(b)(1) of the Act, 21 U.S.C. section 360bbb-3(b)(1), unless the authorization is terminated or revoked.  Performed at [facility], [place] [address] [place], [place] [address]      Time coordinating discharge: 45 minutes  SIGNED:   Tawni Millers, MD  [facility] 04/05/2021, 2:00 PM

## 2021-04-05 NOTE — H&P (Signed)
History and Physical    [name] [name] TDV:[id number] DOB: 08/26/1960 DOA: 04/04/2021  PCP: [name] Sos, MD   Patient coming from: Home  Chief Complaint: Back pain, chest discomfort, elevated HR   HPI: [name] [name] is a pleasant 60 y.o. male with medical history significant for CAD with stents, Barrett esophagus, hypertension, OSA on CPAP, and colon cancer in remission, now presenting to the emergency department for evaluation of back pain, chest discomfort, and elevated resting heart rate.  Patient reports he been in his usual state of health until developing a vague dull ache in his midline thoracic back the evening of 04/03/2021.  This discomfort has been waxing and waning but not completely resolving, and without any alleviating or exacerbating factors.  He has also been experiencing some intermittent chest pressure localized to the central chest, also without any alleviating or exacerbating factors.  He describes the pain as mild, rating it 2-3 on a scale of 10, but is very concerned because he had similar symptoms leading up to a prior MI.  Denies any recent leg swelling or tenderness.  Denies any recent trauma or inciting event that he can identify.  No cough, fever, or chills.  ED Course: Upon arrival to the ED, patient is found to be afebrile, saturating well on room air, and with normal RR, HR, and stable BP.  EKG features sinus rhythm and chest x-ray negative for acute findings.  Chemistry panel and CBC are unremarkable.  Troponin is normal x2.  Patient was given 324 mg of aspirin and hospitalists asked to admit.  Review of Systems:  All other systems reviewed and apart from HPI, are negative.  Past Medical History:  Diagnosis Date   ADENOCARCINOMA, SIGMOID COLON    a. s/p resection/chemo   Allergy    SEASONAL   Anginal pain ([place])    BARRETT'S ESOPHAGUS, HX OF    CAD    a. 2007: DES to LAD and Diag;  b. 08/2011 non-ischemic myoview, EF 60%;  c. 08/2012  NSTEMI/Cath/PCI: LM n, LAD patent stent, D1 small, 80, D2 90 ISR (PTCA), LCX nl, RCA 20d, RPL 95 (2.25x12 Promus Premier DES), EF 55-65%.   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 s/p wedge resection   Sleep apnea     Past Surgical History:  Procedure Laterality Date   COLON RESECTION  05/18/1996   COLON SURGERY     CORONARY ANGIOPLASTY     CORONARY STENT PLACEMENT  05/18/2005   taxus   INCISIONAL HERNIA REPAIR N/A 02/04/2021   Procedure: INCISIONAL HERNIA REPAIR;  Surgeon: Coralie Keens, MD;  Location: [place];  Service: General;  Laterality: N/A;   INGUINAL HERNIA REPAIR N/A 05/31/2017   Procedure: BILATERAL LAPAROSCOPIC INGUINAL HERNIA REPAIR WITH MESH;  Surgeon: Coralie Keens, MD;  Location: [place];  Service: General;  Laterality: N/A;   INSERTION OF MESH Bilateral 05/31/2017   Procedure: INSERTION OF MESH;  Surgeon: Coralie Keens, MD;  Location: [place];  Service: General;  Laterality: Bilateral;   INSERTION OF MESH N/A 02/04/2021   Procedure: INSERTION OF MESH;  Surgeon: Coralie Keens, MD;  Location: [place];  Service: General;  Laterality: N/A;   KNEE SURGERY     LEFT HEART CATH AND CORONARY ANGIOGRAPHY N/A  08/31/2018   Procedure: LEFT HEART CATH AND CORONARY ANGIOGRAPHY;  Surgeon: Leonie Man, MD;  Location: [place] [facility];  Service: Cardiovascular;  Laterality: N/A;   LEFT HEART CATHETERIZATION WITH CORONARY ANGIOGRAM N/A 08/22/2012   Procedure: LEFT HEART CATHETERIZATION WITH CORONARY ANGIOGRAM;  Surgeon: Peter M Martinique, MD;  Location: [facility] CATH LAB;  Service: Cardiovascular;  Laterality: N/A;   LEFT HEART CATHETERIZATION WITH CORONARY ANGIOGRAM N/A 09/12/2013   Procedure: LEFT HEART CATHETERIZATION WITH CORONARY ANGIOGRAM;  Surgeon: Troy Sine, MD;  Location: [facility] CATH  LAB;  Service: Cardiovascular;  Laterality: N/A;   LUNG REMOVAL, PARTIAL     LEFT   TONSILLECTOMY      Social History:   reports that he has never smoked. He has never used smokeless tobacco. He reports that he does not drink alcohol and does not use drugs.  Allergies  Allergen Reactions   Shellfish-Derived Products Anaphylaxis, Swelling and Other (See Comments)    Large amounts of crab   Isosorbide     Pt unsure    Levofloxacin     Other reaction(s): patient refused I am wirried about tendon and joint   Clarithromycin Diarrhea and Rash    Family History  Problem Relation Age of Onset   Heart disease Mother        alive @ 77.  s/p cabg   Colon polyps Mother    Heart disease Father        alive @ 72.  s/p stenting   Heart attack Paternal Uncle        @ 38   Heart attack Paternal Uncle        @ 13   Colon cancer Neg Hx      Prior to Admission medications   Medication Sig Start Date End Date Taking? Authorizing Provider  amLODipine (NORVASC) 5 MG tablet Take 5 mg by mouth 2 (two) times daily.     [provider]  Ascorbic Acid (VITAMIN C) 1000 MG tablet Take 1,000 mg by mouth daily.    [provider]  aspirin 325 MG EC tablet Take 325 mg by mouth daily.    [provider]  Cholecalciferol (VITAMIN D3) 2000 UNITS capsule Take 2,000 Units by mouth daily.  01/29/12   [provider]  clopidogrel (PLAVIX) 75 MG tablet Take 1 tablet (75 mg total) by mouth daily. 08/23/12   Theora Gianotti, NP  cyanocobalamin (,VITAMIN B-12,) 1000 MCG/ML injection Inject 1,000 mcg into the muscle every 30 (thirty) days.  11/23/14   [provider]  DEXILANT 60 MG capsule Take 60 mg by mouth daily.  07/19/11   [provider]  enalapril (VASOTEC) 10 MG tablet Take 10 mg by mouth 2 (two) times daily.    [provider]  ezetimibe (ZETIA) 10 MG tablet Take 10 mg by mouth daily.    [provider]  fexofenadine (ALLEGRA) 180  MG tablet Take 180 mg by mouth daily as needed for allergies or rhinitis.    [provider]  ibuprofen (ADVIL,MOTRIN) 800 MG tablet Take 800 mg by mouth every 8 (eight) hours as needed for headache or moderate pain.    [provider]  metoprolol succinate (TOPROL-XL) 25 MG 24 hr tablet Take 25 mg by mouth every evening.     [provider]  montelukast (SINGULAIR) 10 MG tablet Take 10 mg by mouth daily as needed (for allergies).    [provider]  Multiple Vitamins-Minerals (MULTIVITAMIN PO) Take  1 tablet by mouth daily.    [provider]  omega-3 acid ethyl esters (LOVAZA) 1 G capsule Take 2 g by mouth 2 (two) times daily.     [provider]  polyvinyl alcohol (LIQUIFILM TEARS) 1.4 % ophthalmic solution Place 1 drop into both eyes as needed for dry eyes.    [provider]  rosuvastatin (CRESTOR) 10 MG tablet Take 10 mg by mouth daily.    [provider]  tadalafil (CIALIS) 20 MG tablet Take 20 mg by mouth daily as needed for erectile dysfunction. 11/17/20   [provider]  zinc gluconate 50 MG tablet Take 50 mg by mouth daily.    [provider]    Physical Exam: Vitals:   04/05/21 0235 04/05/21 0400 04/05/21 0500 04/05/21 0545  BP: (!) 152/84 136/80 132/88 123/72  Pulse: (!) 56 (!) 58 61 61  Resp: 16 19 16 18   Temp:    97.6 F (36.4 C)  TempSrc:    Oral  SpO2: 100% 99% 98% 99%    Constitutional: NAD, calm  Eyes: PERTLA, lids and conjunctivae normal ENMT: Mucous membranes are moist. Posterior pharynx clear of any exudate or lesions.   Neck: supple, no masses  Respiratory: no wheezing, no crackles. No accessory muscle use.  Cardiovascular: S1 & S2 heard, regular rate and rhythm. No extremity edema.   Abdomen: No distension, no tenderness, soft. Bowel sounds active.  Musculoskeletal: no clubbing / cyanosis. No joint deformity upper and lower extremities.   Skin: no significant rashes,  lesions, ulcers. Warm, dry, well-perfused. Neurologic: CN 2-12 grossly intact. Moving all extremities. Alert and oriented.  Psychiatric: Pleasant. Cooperative.    Labs and Imaging on Admission: I have personally reviewed following labs and imaging studies  CBC: Recent Labs  Lab 04/04/21 1930  WBC 10.1  NEUTROABS 7.7  HGB 14.4  HCT 43.0  MCV 90.9  PLT 937   Basic Metabolic Panel: Recent Labs  Lab 04/04/21 1930  NA 138  K 3.9  CL 106  CO2 25  GLUCOSE 111*  BUN 17  CREATININE 0.73  CALCIUM 9.3   GFR: CrCl cannot be calculated (Unknown ideal weight.). Liver Function Tests: Recent Labs  Lab 04/04/21 1930  AST 20  ALT 21  ALKPHOS 63  BILITOT 0.5  PROT 6.5  ALBUMIN 3.8   No results for input(s): LIPASE, AMYLASE in the last 168 hours. No results for input(s): AMMONIA in the last 168 hours. Coagulation Profile: No results for input(s): INR, PROTIME in the last 168 hours. Cardiac Enzymes: No results for input(s): CKTOTAL, CKMB, CKMBINDEX, TROPONINI in the last 168 hours. BNP (last 3 results) No results for input(s): PROBNP in the last 8760 hours. HbA1C: No results for input(s): HGBA1C in the last 72 hours. CBG: No results for input(s): GLUCAP in the last 168 hours. Lipid Profile: No results for input(s): CHOL, HDL, LDLCALC, TRIG, CHOLHDL, LDLDIRECT in the last 72 hours. Thyroid Function Tests: No results for input(s): TSH, T4TOTAL, FREET4, T3FREE, THYROIDAB in the last 72 hours. Anemia Panel: No results for input(s): VITAMINB12, FOLATE, FERRITIN, TIBC, IRON, RETICCTPCT in the last 72 hours. Urine analysis: No results found for: COLORURINE, APPEARANCEUR, LABSPEC, PHURINE, GLUCOSEU, HGBUR, BILIRUBINUR, KETONESUR, PROTEINUR, UROBILINOGEN, NITRITE, LEUKOCYTESUR Sepsis Labs: @LABRCNTIP (procalcitonin:4,lacticidven:4) )No results found for this or any previous visit (from the past 240 hour(s)).   Radiological Exams on Admission: DG Chest 2 View  Result Date:  04/04/2021 CLINICAL DATA:  Chest pain and tachycardia EXAM: CHEST - 2 VIEW  COMPARISON:  08/30/2018 FINDINGS: Cardiac shadow is within normal limits. Lungs are well aerated bilaterally. Minimal scarring is noted in the left base as well as prior postsurgical changes. Mild volume loss on the left is noted. No bony abnormality is seen. IMPRESSION: Postsurgical changes with mild volume loss. No acute abnormality noted. Electronically Signed   By: Inez Catalina M.D.   On: 04/04/2021 20:17    EKG: Independently reviewed. Sinus rhythm.   Assessment/Plan   1. Chest pain, CAD  - Pt with CAD and stents p/w vague discomfort in upper back and chest, mild but very concerning to pt d/t similar sxs prior to his MI  - No ischemic features noted on EKG, troponin normal x2, and no acute CXR findings in ED  - He was given 324 mg ASA in ED  - Non-cardiac etiology seems most likely, low suspicion for acute aortic disease given normal ED workup and mild sxs, [place] PE score low but unable to r/o with [facility]  - Check d-dimer, 3rd troponin, repeat EKG, trial NTG, trial GI cocktail    2. HTN  - Continue metoprolol, enalapril, Norvasc    3. HLD   - Continue Crestor   4. Barrett esophagus - Continue PPI   5. OSA  - Continue CPAP qHS    DVT prophylaxis: Lovenox  Code Status: Full  Level of Care:  Level of care: Telemetry Cardiac Family Communication: None present  Disposition Plan:  Patient is from: home  Anticipated d/c is to: Home  Anticipated d/c date is: 04/06/21 Patient currently: pending d-dimer, BNP, repeat EKG & troponin  Consults called: None  Admission status: Observation     Vianne Bulls, MD [facility]  04/05/2021, 6:26 AM

## 2021-06-26 NOTE — Progress Notes (Signed)
Cardiology Office Note:    Date:  06/27/2021   ID:  [name] [name], DOB 1960-10-12, MRN [id number]  PCP:  Tobe Sos, MD  Cardiologist:  Buford Dresser, MD PhD (previously Dr. Saunders Revel)  Referring MD: Tobe Sos, MD   CC: Follow up  History of Present Illness:    [name] [name] is a 61 y.o. male with a hx of CAD s/p remote PCI of PL in 2014 and prior stent of D2, hypertension, hyperlipidemia, OSA on CPAP who is seen in follow up for the evaluation and management of CAD.  CV history: [name] presented to the ED 04/04/2021 complaining of intermittent back pain, described as pressure to the mid to right thoracic region lasting 30 minutes. He also felt chest pressure, and noted intermittent tachycardia with palpitations on his smart watch. He had similar pain with his prior stent placement. EKG and troponins were reassuring.  Today: Overall, he is feeling pretty good. His back pain usually begins with a pressure near his spine, also radiates to his neck. Most of the time he wakes up with this pain after lying in the bed. Sometimes he takes motrin at night, about 3-4 times a week. At times he becomes tremulous.  Usually his blood pressure ranges from 135-140/70-80 at home.  For work he continues to climb many flights of stairs carrying a heavy backpack. He is able to climb 8 flights of stairs routinely with only heavy breathing by the time he finishes. 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   He normally wears compression socks while working. Only notices mild edema when wearing shorter socks.  He denies any chest pain. No lightheadedness, headaches, syncope, orthopnea, or PND.   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 s/p resection/chemo   Allergy    SEASONAL   Anginal pain ([place])    BARRETT'S ESOPHAGUS, HX OF    CAD    a. 2007: DES to LAD and Diag;  b. 08/2011 non-ischemic myoview, EF 60%;  c.  08/2012 NSTEMI/Cath/PCI: LM n, LAD patent stent, D1 small, 80, D2 90 ISR (PTCA), LCX nl, RCA 20d, RPL 95 (2.25x12 Promus Premier DES), EF 55-65%.   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 s/p wedge resection   Sleep apnea     Past Surgical History:  Procedure Laterality Date   COLON RESECTION  12/29/1996   CORONARY STENT PLACEMENT  05/18/2005   taxus   INCISIONAL HERNIA REPAIR N/A 02/04/2021   Procedure: INCISIONAL HERNIA REPAIR;  Surgeon: Coralie Keens, MD;  Location: [place];  Service: General;  Laterality: N/A;   INGUINAL HERNIA REPAIR N/A 05/31/2017   Procedure: BILATERAL LAPAROSCOPIC INGUINAL HERNIA REPAIR WITH MESH;  Surgeon: Coralie Keens, MD;  Location: [place];  Service: General;  Laterality: N/A;   INSERTION OF MESH Bilateral 05/31/2017   Procedure: INSERTION OF MESH;  Surgeon: Coralie Keens, MD;  Location: [place];  Service: General;  Laterality: Bilateral;   INSERTION OF MESH N/A 02/04/2021   Procedure: INSERTION OF MESH;  Surgeon: Coralie Keens, MD;  Location: [place];  Service: General;  Laterality: N/A;   KNEE SURGERY     LEFT HEART CATH AND CORONARY ANGIOGRAPHY N/A 08/31/2018  Procedure: LEFT HEART CATH AND CORONARY ANGIOGRAPHY;  Surgeon: Leonie Man, MD;  Location: [place] [facility];  Service: Cardiovascular;  Laterality: N/A;   LEFT HEART CATHETERIZATION WITH CORONARY ANGIOGRAM N/A 08/22/2012   Procedure: LEFT HEART CATHETERIZATION WITH CORONARY ANGIOGRAM;  Surgeon: Peter M Martinique, MD;  Location: [facility] CATH LAB;  Service: Cardiovascular;  Laterality: N/A;   LEFT HEART CATHETERIZATION WITH CORONARY ANGIOGRAM N/A 09/12/2013   Procedure: LEFT HEART CATHETERIZATION WITH CORONARY ANGIOGRAM;  Surgeon: Troy Sine, MD;  Location: [facility] CATH LAB;  Service:  Cardiovascular;  Laterality: N/A;   LUNG REMOVAL, PARTIAL  1999   LEFT   TONSILLECTOMY      Current Medications: Current Outpatient Medications on File Prior to Visit  Medication Sig   ALPRAZolam (XANAX) 0.5 MG tablet Take 1 tablet (0.5 mg total) by mouth 2 (two) times daily as needed for anxiety.   amLODipine (NORVASC) 5 MG tablet Take 5 mg by mouth 2 (two) times daily.    Ascorbic Acid (VITAMIN C) 1000 MG tablet Take 1,000 mg by mouth daily.   aspirin EC 81 MG EC tablet Take 1 tablet (81 mg total) by mouth daily. Swallow whole.   Cholecalciferol (VITAMIN D3) 2000 UNITS capsule Take 2,000 Units by mouth daily.    clopidogrel (PLAVIX) 75 MG tablet Take 1 tablet (75 mg total) by mouth daily.   cyanocobalamin (,VITAMIN B-12,) 1000 MCG/ML injection Inject 1,000 mcg into the muscle [name] 30 (thirty) days.    DEXILANT 60 MG capsule Take 60 mg by mouth daily.    enalapril (VASOTEC) 10 MG tablet Take 10 mg by mouth 2 (two) times daily.   ezetimibe (ZETIA) 10 MG tablet Take 10 mg by mouth daily.   fexofenadine (ALLEGRA) 180 MG tablet Take 180 mg by mouth daily as needed for allergies or rhinitis.   ibuprofen (ADVIL,MOTRIN) 800 MG tablet Take 800 mg by mouth [name] 8 (eight) hours as needed for headache or moderate pain.   metoprolol succinate (TOPROL-XL) 25 MG 24 hr tablet Take 25 mg by mouth daily.   montelukast (SINGULAIR) 10 MG tablet Take 10 mg by mouth daily as needed (for allergies).   Multiple Vitamins-Minerals (MULTIVITAMIN PO) Take 1 tablet by mouth daily.   omega-3 acid ethyl esters (LOVAZA) 1 G capsule Take 2 g by mouth 2 (two) times daily.    polyvinyl alcohol (LIQUIFILM TEARS) 1.4 % ophthalmic solution Place 1 drop into both eyes as needed for dry eyes.   rosuvastatin (CRESTOR) 10 MG tablet Take 10 mg by mouth daily.   tadalafil (CIALIS) 20 MG tablet Take 20 mg by mouth daily as needed for erectile dysfunction.   zinc gluconate 50 MG tablet Take 50 mg by mouth daily.   No current  facility-administered medications on file prior to visit.     Allergies:   Shellfish-derived products, Isosorbide, Levofloxacin, and Clarithromycin   Social History   Tobacco Use   Smoking status: Never   Smokeless tobacco: Never  Vaping Use   Vaping Use: Never used  Substance Use Topics   Alcohol use: No   Drug use: No    Family History: The patient's family history includes Colon polyps in his mother; Heart attack in his paternal uncle and paternal uncle; Heart disease in his father and mother. There is no history of Colon cancer.  ROS:   Please see the history of present illness.   (+) Chronic back pain/pressure radiating to neck (+) Exertional shortness of breath Additional pertinent ROS otherwise  unremarkable.  EKGs/Labs/Other Studies Reviewed:    The following studies were reviewed today:  Left Heart Cath 08/31/2018: The left ventricular systolic function is normal. The left ventricular ejection fraction is 55-65% by visual estimate. LV end diastolic pressure is normal. ---------------------------------------------- -- Previously placed Prox LAD to Mid LAD DES stent is widely patent. -- Previously placed Ost 1st Diag-2 DES stent is 5% stenosed. -- Previously placed Post Atrio DES stent is widely patent.   SUMMARY Widely patent LAD-diagonal and PLA stents. Otherwise minimal CAD. Normal EF and EDP.   RECOMMENDATIONS Considered non-anginal reason for chest pain. Continue risk factor modification Anticipate discharge later today.  Diagnostic Dominance: Right   CT Chest 08/27/2017 ([place]): FINDINGS:   AIRWAY, LUNGS, PLEURA:   Clear central tracheobronchial tree.  No lung consolidation. Sequelae of left lower lobe wedge resection.   The nodular opacity seen on abdominal MRI dated 03/05/2017 (image 78, sequence 14) is corresponding to a branching vessel on current examination (image 79, sequence 2). There is 2 mm calcified nodule in the left lung base  (2:77).   MEDIASTINUM:   Normal heart size.  No pericardial effusion. Scattered atherosclerotic calcifications within the aortic arch and coronary arteries. Normal caliber thoracic aorta.   Borderline enlarged right axillary lymph nodes. No mediastinal lymphadenopathy by size criteria.     IMAGED ABDOMEN: Unremarkable.   SOFT TISSUES:  Unremarkable.   BONES:  No worrisome lytic or blastic osseous lesion.    IMPRESSION:   Previously seen opacity on MR is likely a branching vessel.   There is a 2 mm nodule in the left lower lobe.   Cath/PCI: LHC (09/12/13): LMCA normal. Normal LAD with patent mid vessel stent. D2 with 20-30% stenosis proximal to patent stent. LCx without significant disease. RCA with 20-30% disease. 20% ostial PDA stenosis. Patent stent in PL branch. LHC/PCI (08/22/12): LMCA normal. LAD with patent mid vessel stent. Small first diagonal branch with 80% ostial stenosis. Large second diagonal branch with 90% in-stent restenosis. LCx normal. Large, dominant RCA with mild luminal irregularities in the mid and distal vessel. PL branch has a 95% focal stenosis. LVEF 55-65%. Successful PTCA to D2 stent with AngioSculpt 2.5 x 10 mm balloon. Successful PCI to PL branch with a Promus Premier 2.25 x 12 mm drug-eluting stent.   CV Surgery: None   EP Procedures and Devices: None   Non-Invasive Evaluation(s): Exercise myocardial perfusion stress test (09/14/11): Good exercise capacity (9 minutes, 0 seconds). No significant ST-T changes. Occasional PVCs noted. Normal myocardial perfusion without evidence of ischemia or scar. LVEF 60%.  EKG:  EKG is personally reviewed.   06/27/2021: not ordered today 02/22/2020: sinus bradycardia at 53 bpm.  Recent Labs: 04/04/2021: ALT 21; BUN 17; Creatinine, Ser 0.73; Hemoglobin 14.4; Platelets 238; Potassium 3.9; Sodium 138 04/05/2021: B Natriuretic Peptide 21.8   Recent Lipid Panel    Component Value Date/Time   CHOL 123 08/31/2018 0925    TRIG 93 08/31/2018 0925   HDL 39 (L) 08/31/2018 0925   CHOLHDL 3.2 08/31/2018 0925   VLDL 19 08/31/2018 0925   LDLCALC 65 08/31/2018 0925    Physical Exam:    VS:  BP (!) 146/74 (BP Location: Right Arm, Patient Position: Sitting, Cuff Size: Normal)    Pulse (!) 48    Ht 5\' 11"  (1.803 m)    Wt 197 lb (89.4 kg)    SpO2 97%    BMI 27.48 kg/m     Wt Readings from Last 3 Encounters:  06/27/21 197 lb (89.4 kg)  02/04/21 192 lb (87.1 kg)  01/22/21 200 lb 9.6 oz (91 kg)    GEN: Well nourished, well developed in no acute distress HEENT: Normal, moist mucous membranes NECK: No JVD CARDIAC: regular rhythm, normal S1 and S2, no rubs or gallops. No murmur. VASCULAR: Radial and DP pulses 2+ bilaterally. No carotid bruits RESPIRATORY:  Clear to auscultation without rales, wheezing or rhonchi  ABDOMEN: Soft, non-tender, non-distended MUSCULOSKELETAL:  Ambulates independently. Tenderness, palpable muscle knot between the spine and right shoulder blade. SKIN: Warm and dry, no edema NEUROLOGIC:  Alert and oriented x 3. No focal neuro deficits noted. PSYCHIATRIC:  Normal affect   ASSESSMENT:    1. Coronary artery disease involving native coronary artery of native heart without angina pectoris   2. Mixed hyperlipidemia   3. Essential hypertension   4. Cardiac risk counseling   5. Counseling on health promotion and disease prevention     PLAN:    CAD s/p prior remote PCI, no current angina:  -continue aspirin 81 mg, clopidogrel 75 mg. He was told to be on clopidogrel indefinitely -continue metoprolol succinate 25 mg  Hypertension: elevated today  -he will check numbers at home and bring to upcoming PCP appt. If still elevated, would increase enalapril or change to valsartan -Continue amlodipine 5 mg BID, enalapril 10 mg BID, metoprolol succinate 25 mg  Hyperlipidemia, mixed: LDL goal <70. TG goal <150 - Prior LDL was 65. Last TG 93. Has recheck pending in 2 weeks -continue rosuvastatin 10  mg, ezetimibe, lovaza  Secondary prevention: -recommend heart healthy/Mediterranean diet, with whole grains, fruits, vegetable, fish, lean meats, nuts, and olive oil. Limit salt. -recommend moderate walking, 3-5 times/week for 30-50 minutes each session. Aim for at least 150 minutes.week. Goal should be pace of 3 miles/hours, or walking 1.5 miles in 30 minutes -recommend avoidance of tobacco products. Avoid excess alcohol. -Additional risk factor control:  -Diabetes risk: A1c 5.5 in 2020  -Weight: BMI 27  -Sleep apnea: on CPAP  Plan for follow up: 1 year or sooner as needed.  Medication Adjustments/Labs and Tests Ordered: Current medicines are reviewed at length with the patient today.  Concerns regarding medicines are outlined above.   No orders of the defined types were placed in this encounter.  No orders of the defined types were placed in this encounter.  Patient Instructions  Medication Instructions:  Your Physician recommend you continue on your current medication as directed.    *If you need a refill on your cardiac medications before your next appointment, please call your pharmacy*   Lab Work: None ordered today   Testing/Procedures: None ordered today   Follow-Up: At [facility], you and your health needs are our priority.  As part of our continuing mission to provide you with exceptional heart care, we have created designated Provider Care Teams.  These Care Teams include your primary Cardiologist (physician) and Advanced Practice Providers (APPs -  Physician Assistants and Nurse Practitioners) who all work together to provide you with the care you need, when you need it.  We recommend signing up for the patient portal called "MyChart".  Sign up information is provided on this After Visit Summary.  MyChart is used to connect with patients for Virtual Visits (Telemedicine).  Patients are able to view lab/test results, encounter notes, upcoming appointments, etc.   Non-urgent messages can be sent to your provider as well.   To learn more about what you can do with MyChart, go  to [email or web address].    Your next appointment:   1 year(s)  The format for your next appointment:   In Person  Provider:   Buford Dresser, MD   If blood pressure remains >130/80, I would consider increasing enalapril or changing to valsartan for better control.   I,Mathew Stumpf,acting as a Education administrator for [employer], MD.,have documented all relevant documentation on the behalf of Buford Dresser, MD,as directed by  Buford Dresser, MD while in the presence of Buford Dresser, MD.  I, Buford Dresser, MD, have reviewed all documentation for this visit. The documentation on 06/27/21 for the exam, diagnosis, procedures, and orders are all accurate and complete.   Signed, Buford Dresser, MD PhD 06/27/2021 1:07 PM    [place]

## 2021-06-27 ENCOUNTER — Other Ambulatory Visit: Payer: Self-pay

## 2021-06-27 ENCOUNTER — Encounter (HOSPITAL_BASED_OUTPATIENT_CLINIC_OR_DEPARTMENT_OTHER): Payer: Self-pay | Admitting: Cardiology

## 2021-06-27 ENCOUNTER — Ambulatory Visit (INDEPENDENT_AMBULATORY_CARE_PROVIDER_SITE_OTHER): Payer: BC Managed Care – PPO | Admitting: Cardiology

## 2021-06-27 VITALS — BP 146/74 | HR 48 | Ht 71.0 in | Wt 197.0 lb

## 2021-06-27 DIAGNOSIS — Z7189 Other specified counseling: Secondary | ICD-10-CM

## 2021-06-27 DIAGNOSIS — I2510 Atherosclerotic heart disease of native coronary artery without angina pectoris: Secondary | ICD-10-CM | POA: Diagnosis not present

## 2021-06-27 DIAGNOSIS — I10 Essential (primary) hypertension: Secondary | ICD-10-CM | POA: Diagnosis not present

## 2021-06-27 DIAGNOSIS — E782 Mixed hyperlipidemia: Secondary | ICD-10-CM

## 2021-06-27 NOTE — Patient Instructions (Signed)
Medication Instructions:  Your Physician recommend you continue on your current medication as directed.    *If you need a refill on your cardiac medications before your next appointment, please call your pharmacy*   Lab Work: None ordered today   Testing/Procedures: None ordered today   Follow-Up: At [facility], you and your health needs are our priority.  As part of our continuing mission to provide you with exceptional heart care, we have created designated Provider Care Teams.  These Care Teams include your primary Cardiologist (physician) and Advanced Practice Providers (APPs -  Physician Assistants and Nurse Practitioners) who all work together to provide you with the care you need, when you need it.  We recommend signing up for the patient portal called "MyChart".  Sign up information is provided on this After Visit Summary.  MyChart is used to connect with patients for Virtual Visits (Telemedicine).  Patients are able to view lab/test results, encounter notes, upcoming appointments, etc.  Non-urgent messages can be sent to your provider as well.   To learn more about what you can do with MyChart, go to [email or web address].    Your next appointment:   1 year(s)  The format for your next appointment:   In Person  Provider:   Buford Dresser, MD   If blood pressure remains >130/80, I would consider increasing enalapril or changing to valsartan for better control.

## 2022-03-02 ENCOUNTER — Ambulatory Visit: Payer: BC Managed Care – PPO | Admitting: Dermatology

## 2022-03-20 ENCOUNTER — Encounter (HOSPITAL_BASED_OUTPATIENT_CLINIC_OR_DEPARTMENT_OTHER): Payer: Self-pay

## 2022-03-20 NOTE — Telephone Encounter (Signed)
Hey there, do you want to preescribe something or ref over to lipid clinic?

## 2022-06-11 ENCOUNTER — Telehealth (HOSPITAL_BASED_OUTPATIENT_CLINIC_OR_DEPARTMENT_OTHER): Payer: Self-pay | Admitting: Family

## 2022-06-11 NOTE — Telephone Encounter (Signed)
Patient previously sent MyChart message requesting Repatha as only able to tolerate low dose Rosuvastatin '10mg'$  QD. Known hx of CAD. Labs obtained with LDL 85 (above goal <70). Prior auth submitted and approved for Repatha. 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   UGAYGE:[id number];EQFDVO:[id number];Review Type:Prior Auth;Coverage Start Date:05/12/2022;Coverage End Date:06/11/2023;  Due for follow up 06/2022 with Dr. Harrell Gave.   Will request nursing team: Call patient to make him aware of approval and find out his preferred pharmacy Send Rx to preferred pharmacy Order repeat FLP/LFT for after 6 doses of Repatha Schedule f/u with Dr. Harrell Gave 06/2022 (if he wishes, could schedule OV for morning appt 6 weeks after starting Repatha and collect labs at time of OV)  Loel Dubonnet, NP

## 2022-06-12 NOTE — Telephone Encounter (Signed)
Called patient, provided the following recommendations. He states that he would like to hold off on the Repatha for now, Rx not send to pharm. He did want to get scheduled for his follow up. Scheduled patient with Dr. Harrell Gave for 2/23.     Will request nursing team: Call patient to make him aware of approval and find out his preferred pharmacy Send Rx to preferred pharmacy Order repeat FLP/LFT for after 6 doses of Repatha Schedule f/u with Dr. Harrell Gave 06/2022 (if he wishes, could schedule OV for morning appt 6 weeks after starting Repatha and collect labs at time of OV)

## 2022-07-10 ENCOUNTER — Ambulatory Visit (INDEPENDENT_AMBULATORY_CARE_PROVIDER_SITE_OTHER): Payer: BC Managed Care – PPO | Admitting: Cardiology

## 2022-07-10 ENCOUNTER — Encounter (HOSPITAL_BASED_OUTPATIENT_CLINIC_OR_DEPARTMENT_OTHER): Payer: Self-pay | Admitting: Cardiology

## 2022-07-10 VITALS — BP 122/76 | HR 47 | Ht 70.5 in | Wt 197.0 lb

## 2022-07-10 DIAGNOSIS — Z7189 Other specified counseling: Secondary | ICD-10-CM

## 2022-07-10 DIAGNOSIS — I10 Essential (primary) hypertension: Secondary | ICD-10-CM

## 2022-07-10 DIAGNOSIS — E782 Mixed hyperlipidemia: Secondary | ICD-10-CM

## 2022-07-10 DIAGNOSIS — I2510 Atherosclerotic heart disease of native coronary artery without angina pectoris: Secondary | ICD-10-CM | POA: Diagnosis not present

## 2022-07-10 NOTE — Patient Instructions (Addendum)
Medication Instructions:  Continue current medication  *If you need a refill on your cardiac medications before your next appointment, please call your pharmacy*   Lab Work: None Ordered   Testing/Procedures: None Ordered   Follow-Up: At [facility], you and your health needs are our priority.  As part of our continuing mission to provide you with exceptional heart care, we have created designated Provider Care Teams.  These Care Teams include your primary Cardiologist (physician) and Advanced Practice Providers (APPs -  Physician Assistants and Nurse Practitioners) who all work together to provide you with the care you need, when you need it.  We recommend signing up for the patient portal called "MyChart".  Sign up information is provided on this After Visit Summary.  MyChart is used to connect with patients for Virtual Visits (Telemedicine).  Patients are able to view lab/test results, encounter notes, upcoming appointments, etc.  Non-urgent messages can be sent to your provider as well.   To learn more about what you can do with MyChart, go to [email or web address].    Your next appointment:   1 year(s)  Provider:   Buford Dresser, MD    Other Instructions Fax # [phone]

## 2022-07-10 NOTE — Progress Notes (Signed)
Cardiology Office Note:    Date:  07/10/2022   ID:  [name] [name], DOB Jun 25, 1960, MRN [id number]  PCP:  Tobe Sos, MD  Cardiologist:  Buford Dresser, MD PhD (previously Dr. Saunders Revel)  Referring MD: Tobe Sos, MD   CC: Follow up  History of Present Illness:    [name] [name] is a 62 y.o. male with a hx of CAD s/p remote PCI of PL in 2014 and prior stent of D2, hypertension, hyperlipidemia, OSA on CPAP who is seen in follow up for the evaluation and management of CAD.  CV history: [name] [name] presented to the ED 04/04/2021 complaining of intermittent back pain, described as pressure to the mid to right thoracic region lasting 30 minutes. He also felt chest pressure, and noted intermittent tachycardia with palpitations on his smart watch. He had similar pain with his prior stent placement. EKG and troponins were reassuring.  At his last appointment he continued to complain of chronic back pain. Home blood pressures ranged from 135-140/70-80. His BP was elevated in clinic. He was asked to track his blood pressures and bring to upcoming PCP appointment. If still elevated, would increase enalapril or change to valsartan.  Today, he is accompanied by a family member. He is scheduled for a right knee arthroscopy with meniscectomy 07/20/2022. He states he will be off of Plavix for 5-7 days. He will also stop amlodipine prior to surgery. Recently his most intense activity has been walking up and down stairs.  In clinic today his blood pressure is well controlled at 122/76. This morning at another clinic visit it was 135/78. He denies any very low or high readings.  He had deferred Repatha and is not certain that he wants to start. At this time he continues to tolerate 10 mg rosuvastatin without myalgias. He is also compliant with Zetia.  He plans to retire next year.  He denies any palpitations, chest pain, shortness of breath, or peripheral edema. No lightheadedness,  headaches, syncope, orthopnea, or PND.   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 s/p resection/chemo   Allergy    SEASONAL   Anginal pain ([place])    BARRETT'S ESOPHAGUS, HX OF    CAD    a. 2007: DES to LAD and Diag;  b. 08/2011 non-ischemic myoview, EF 60%;  c. 08/2012 NSTEMI/Cath/PCI: LM n, LAD patent stent, D1 small, 80, D2 90 ISR (PTCA), LCX nl, RCA 20d, RPL 95 (2.25x12 Promus Premier DES), EF 55-65%.   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 s/p wedge resection   Sleep apnea     Past Surgical History:  Procedure Laterality Date   COLON RESECTION  12/29/1996   CORONARY STENT PLACEMENT  05/18/2005   taxus   INCISIONAL HERNIA REPAIR N/A 02/04/2021   Procedure: INCISIONAL HERNIA REPAIR;  Surgeon: Coralie Keens, MD;  Location: [place];  Service: General;  Laterality: N/A;   INGUINAL HERNIA REPAIR N/A 05/31/2017   Procedure: BILATERAL LAPAROSCOPIC INGUINAL HERNIA REPAIR WITH MESH;  Surgeon: Coralie Keens, MD;  Location: [place];  Service: General;  Laterality: N/A;   INSERTION OF MESH Bilateral 05/31/2017   Procedure: INSERTION OF MESH;  Surgeon: Coralie Keens, MD;  Location: [place];  Service: General;  Laterality: Bilateral;   INSERTION OF MESH N/A 02/04/2021   Procedure: INSERTION OF MESH;  Surgeon: Coralie Keens, MD;  Location: [place];  Service: General;  Laterality: N/A;   KNEE SURGERY     LEFT HEART CATH AND CORONARY ANGIOGRAPHY N/A 08/31/2018   Procedure: LEFT HEART CATH AND CORONARY ANGIOGRAPHY;  Surgeon: Leonie Man, MD;  Location: [place] CV LAB;  Service: Cardiovascular;  Laterality: N/A;   LEFT HEART CATHETERIZATION WITH CORONARY ANGIOGRAM N/A 08/22/2012   Procedure: LEFT HEART CATHETERIZATION WITH CORONARY  ANGIOGRAM;  Surgeon: Peter M Martinique, MD;  Location: [facility] CATH LAB;  Service: Cardiovascular;  Laterality: N/A;   LEFT HEART CATHETERIZATION WITH CORONARY ANGIOGRAM N/A 09/12/2013   Procedure: LEFT HEART CATHETERIZATION WITH CORONARY ANGIOGRAM;  Surgeon: Troy Sine, MD;  Location: [facility] CATH LAB;  Service: Cardiovascular;  Laterality: N/A;   LUNG REMOVAL, PARTIAL  1999   LEFT   TONSILLECTOMY      Current Medications: Current Outpatient Medications on File Prior to Visit  Medication Sig   ALPRAZolam (XANAX) 0.5 MG tablet Take 1 tablet (0.5 mg total) by mouth 2 (two) times daily as needed for anxiety.   amLODipine (NORVASC) 5 MG tablet Take 5 mg by mouth 2 (two) times daily.    Ascorbic Acid (VITAMIN C) 1000 MG tablet Take 1,000 mg by mouth daily.   aspirin EC 81 MG EC tablet Take 1 tablet (81 mg total) by mouth daily. Swallow whole.   Cholecalciferol (VITAMIN D3) 2000 UNITS capsule Take 2,000 Units by mouth daily.    clopidogrel (PLAVIX) 75 MG tablet Take 1 tablet (75 mg total) by mouth daily.   cyanocobalamin (,VITAMIN B-12,) 1000 MCG/ML injection Inject 1,000 mcg into the muscle every 30 (thirty) days.    DEXILANT 60 MG capsule Take 60 mg by mouth daily.    enalapril (VASOTEC) 10 MG tablet Take 10 mg by mouth 2 (two) times daily.   ezetimibe (ZETIA) 10 MG tablet Take 10 mg by mouth daily.   fexofenadine (ALLEGRA) 180 MG tablet Take 180 mg by mouth daily as needed for allergies or rhinitis.   ibuprofen (ADVIL,MOTRIN) 800 MG tablet Take 800 mg by mouth every 8 (eight) hours as needed for headache or moderate pain.   metoprolol succinate (TOPROL-XL) 25 MG 24 hr tablet Take 25 mg by mouth daily.   montelukast (SINGULAIR) 10 MG tablet Take 10 mg by mouth daily as needed (for allergies).   Multiple Vitamins-Minerals (MULTIVITAMIN PO) Take 1 tablet by mouth daily.   omega-3 acid ethyl esters (LOVAZA) 1 G capsule Take 2 g by mouth 2 (two) times daily.    polyvinyl alcohol (LIQUIFILM TEARS) 1.4 %  ophthalmic solution Place 1 drop into both eyes as needed for dry eyes.   rosuvastatin (CRESTOR) 10 MG tablet Take 10 mg by mouth daily.   tadalafil (CIALIS) 20 MG tablet Take 20 mg by mouth daily as needed for erectile dysfunction.   zinc gluconate 50 MG tablet Take 50 mg by mouth daily.   No current facility-administered medications on file prior to visit.     Allergies:   Shellfish-derived products, Isosorbide, Levofloxacin, and Clarithromycin   Social History   Tobacco Use   Smoking status: Never   Smokeless tobacco: Never  Vaping Use   Vaping Use: Never used  Substance Use Topics   Alcohol use: No   Drug use: No    Family History: The patient's family history includes Colon polyps in his mother; Heart  attack in his paternal uncle and paternal uncle; Heart disease in his father and mother. There is no history of Colon cancer.  ROS:   Please see the history of present illness.   Additional pertinent ROS otherwise unremarkable.  EKGs/Labs/Other Studies Reviewed:    The following studies were reviewed today:  Left Heart Cath 08/31/2018: The left ventricular systolic function is normal. The left ventricular ejection fraction is 55-65% by visual estimate. LV end diastolic pressure is normal. ---------------------------------------------- -- Previously placed Prox LAD to Mid LAD DES stent is widely patent. -- Previously placed Ost 1st Diag-2 DES stent is 5% stenosed. -- Previously placed Post Atrio DES stent is widely patent.   SUMMARY Widely patent LAD-diagonal and PLA stents. Otherwise minimal CAD. Normal EF and EDP.   RECOMMENDATIONS Considered non-anginal reason for chest pain. Continue risk factor modification Anticipate discharge later today.  Diagnostic Dominance: Right   CT Chest 08/27/2017 ([place]): FINDINGS:   AIRWAY, LUNGS, PLEURA:   Clear central tracheobronchial tree.  No lung consolidation. Sequelae of left lower lobe wedge resection.    The nodular opacity seen on abdominal MRI dated 03/05/2017 (image 78, sequence 14) is corresponding to a branching vessel on current examination (image 79, sequence 2). There is 2 mm calcified nodule in the left lung base (2:77).   MEDIASTINUM:   Normal heart size.  No pericardial effusion. Scattered atherosclerotic calcifications within the aortic arch and coronary arteries. Normal caliber thoracic aorta.   Borderline enlarged right axillary lymph nodes. No mediastinal lymphadenopathy by size criteria.     IMAGED ABDOMEN: Unremarkable.   SOFT TISSUES:  Unremarkable.   BONES:  No worrisome lytic or blastic osseous lesion.    IMPRESSION:   Previously seen opacity on MR is likely a branching vessel.   There is a 2 mm nodule in the left lower lobe.   Cath/PCI: LHC (09/12/13): LMCA normal. Normal LAD with patent mid vessel stent. D2 with 20-30% stenosis proximal to patent stent. LCx without significant disease. RCA with 20-30% disease. 20% ostial PDA stenosis. Patent stent in PL branch. LHC/PCI (08/22/12): LMCA normal. LAD with patent mid vessel stent. Small first diagonal branch with 80% ostial stenosis. Large second diagonal branch with 90% in-stent restenosis. LCx normal. Large, dominant RCA with mild luminal irregularities in the mid and distal vessel. PL branch has a 95% focal stenosis. LVEF 55-65%. Successful PTCA to D2 stent with AngioSculpt 2.5 x 10 mm balloon. Successful PCI to PL branch with a Promus Premier 2.25 x 12 mm drug-eluting stent.   CV Surgery: None   EP Procedures and Devices: None   Non-Invasive Evaluation(s): Exercise myocardial perfusion stress test (09/14/11): Good exercise capacity (9 minutes, 0 seconds). No significant ST-T changes. Occasional PVCs noted. Normal myocardial perfusion without evidence of ischemia or scar. LVEF 60%.  EKG:  EKG is personally reviewed.   07/10/2022:  not ordered today 06/27/2021: not ordered 02/22/2020: sinus bradycardia at 53  bpm.  Recent Labs: No results found for requested labs within last 365 days.   Recent Lipid Panel    Component Value Date/Time   CHOL 123 08/31/2018 0925   TRIG 93 08/31/2018 0925   HDL 39 (L) 08/31/2018 0925   CHOLHDL 3.2 08/31/2018 0925   VLDL 19 08/31/2018 0925   LDLCALC 65 08/31/2018 0925    Physical Exam:    VS:  BP 122/76   Pulse (!) 47   Ht 5' 10.5" (1.791 m)   Wt 197 lb (89.4 kg)   SpO2  96%   BMI 27.87 kg/m     Wt Readings from Last 3 Encounters:  07/10/22 197 lb (89.4 kg)  06/27/21 197 lb (89.4 kg)  02/04/21 192 lb (87.1 kg)    GEN: Well nourished, well developed in no acute distress HEENT: Normal, moist mucous membranes NECK: No JVD CARDIAC: regular rhythm, normal S1 and S2, no rubs or gallops. No murmur. VASCULAR: Radial and DP pulses 2+ bilaterally. No carotid bruits RESPIRATORY:  Clear to auscultation without rales, wheezing or rhonchi  ABDOMEN: Soft, non-tender, non-distended MUSCULOSKELETAL:  Ambulates independently.  SKIN: Warm and dry, no edema NEUROLOGIC:  Alert and oriented x 3. No focal neuro deficits noted. PSYCHIATRIC:  Normal affect   ASSESSMENT:    1. Coronary artery disease involving native coronary artery of native heart without angina pectoris   2. Mixed hyperlipidemia   3. Essential hypertension   4. Cardiac risk counseling   5. Counseling on health promotion and disease prevention      PLAN:    CAD s/p prior remote PCI, no current angina:  -continue aspirin 81 mg, clopidogrel 75 mg. He was told to be on clopidogrel indefinitely -continue metoprolol succinate 25 mg  Hypertension:  -at goal of <130/80 today -Continue amlodipine 5 mg BID, enalapril 10 mg BID, metoprolol succinate 25 mg  Hyperlipidemia, mixed: LDL goal <70. TG goal <150 - Prior LDL was 65. Last TG 93. He will send [place] recent labs -continue rosuvastatin 10 mg, ezetimibe, lovaza  Secondary prevention: -recommend heart healthy/Mediterranean diet, with whole  grains, fruits, vegetable, fish, lean meats, nuts, and olive oil. Limit salt. -recommend moderate walking, 3-5 times/week for 30-50 minutes each session. Aim for at least 150 minutes.week. Goal should be pace of 3 miles/hours, or walking 1.5 miles in 30 minutes -recommend avoidance of tobacco products. Avoid excess alcohol. -Additional risk factor control:  -Diabetes risk: A1c 5.5 in 2020  -Weight: BMI 27  -Sleep apnea: on CPAP  Plan for follow up: 1 year or sooner as needed.  Medication Adjustments/Labs and Tests Ordered: Current medicines are reviewed at length with the patient today.  Concerns regarding medicines are outlined above.   No orders of the defined types were placed in this encounter.  No orders of the defined types were placed in this encounter.  Patient Instructions  Medication Instructions:  Continue current medication  *If you need a refill on your cardiac medications before your next appointment, please call your pharmacy*   Lab Work: None Ordered   Testing/Procedures: None Ordered   Follow-Up: At [facility], you and your health needs are our priority.  As part of our continuing mission to provide you with exceptional heart care, we have created designated Provider Care Teams.  These Care Teams include your primary Cardiologist (physician) and Advanced Practice Providers (APPs -  Physician Assistants and Nurse Practitioners) who all work together to provide you with the care you need, when you need it.  We recommend signing up for the patient portal called "MyChart".  Sign up information is provided on this After Visit Summary.  MyChart is used to connect with patients for Virtual Visits (Telemedicine).  Patients are able to view lab/test results, encounter notes, upcoming appointments, etc.  Non-urgent messages can be sent to your provider as well.   To learn more about what you can do with MyChart, go to [email or web address].    Your next  appointment:   1 year(s)  Provider:   Buford Dresser, MD    Other Instructions  Fax # [phone]    I,Mathew Stumpf,acting as a scribe for Buford Dresser, MD.,have documented all relevant documentation on the behalf of Buford Dresser, MD,as directed by  Buford Dresser, MD while in the presence of Buford Dresser, MD.  I, Buford Dresser, MD, have reviewed all documentation for this visit. The documentation on 07/10/22 for the exam, diagnosis, procedures, and orders are all accurate and complete.   Signed, Buford Dresser, MD PhD 07/10/2022     [place]

## 2023-03-31 ENCOUNTER — Encounter (HOSPITAL_BASED_OUTPATIENT_CLINIC_OR_DEPARTMENT_OTHER): Payer: Self-pay

## 2023-04-06 ENCOUNTER — Encounter (HOSPITAL_BASED_OUTPATIENT_CLINIC_OR_DEPARTMENT_OTHER): Payer: Self-pay | Admitting: Family

## 2023-04-06 ENCOUNTER — Telehealth (HOSPITAL_BASED_OUTPATIENT_CLINIC_OR_DEPARTMENT_OTHER): Payer: Self-pay

## 2023-04-06 NOTE — Telephone Encounter (Signed)
   Pre-operative Risk Assessment    Patient Name: [name] [name]  DOB: [birth year] MRN: [id number]      Request for Surgical Clearance    Procedure:   EGD and Colonoscopy  Date of Surgery:  Clearance TBD                                 Surgeon:  [facility] GI Surgeon's Group or Practice Name:  N/A Phone number:  [phone] Fax number:  [phone]   Type of Clearance Requested:   - Medical  - Pharmacy:  Hold Clopidogrel (Plavix) x 5 days   Type of Anesthesia:  Not Indicated   Additional requests/questions:    Marty Heck   04/06/2023, 5:00 PM

## 2023-04-29 NOTE — Telephone Encounter (Signed)
   Patient Name: [name] [name]  DOB: Aug 15, 1960 MRN: [id number]  Primary Cardiologist: Jodelle Red, MD  Chart reviewed as part of pre-operative protocol coverage. Given past medical history and time since last visit, based on ACC/AHA guidelines, [name] is at acceptable risk for the planned procedure without further cardiovascular testing.  Per Dr. Cristal Deer: His stents were patient in 2020, and as of last visit he did not report any chest pain. He can hold clopidogrel for 5 days but should not stop aspirin and should restart clopidogrel ASAP post scope.    The patient was advised that if he develops new symptoms prior to surgery to contact our office to arrange for a follow-up visit, and he verbalized understanding.  I will route this recommendation to the requesting party via Epic fax function and remove from pre-op pool.  Please call with questions.  Joni Reining, NP 04/29/2023, 8:14 AM

## 2023-06-18 ENCOUNTER — Telehealth (INDEPENDENT_AMBULATORY_CARE_PROVIDER_SITE_OTHER): Payer: Self-pay | Admitting: Otolaryngology

## 2023-06-18 NOTE — Telephone Encounter (Signed)
NEED SCOPE Consult per pt Dr Irene Pap told him, he did not need a referral, his wife came in for a visit and Tami asked to make the husband an apt as well for documentation ADDING THIS TO COVER ME

## 2023-07-14 ENCOUNTER — Ambulatory Visit (HOSPITAL_BASED_OUTPATIENT_CLINIC_OR_DEPARTMENT_OTHER): Payer: BC Managed Care – PPO | Admitting: Cardiology

## 2023-07-14 ENCOUNTER — Encounter (HOSPITAL_BASED_OUTPATIENT_CLINIC_OR_DEPARTMENT_OTHER): Payer: Self-pay | Admitting: Cardiology

## 2023-07-14 VITALS — BP 125/70 | HR 67 | Ht 70.0 in | Wt 200.9 lb

## 2023-07-14 DIAGNOSIS — I2510 Atherosclerotic heart disease of native coronary artery without angina pectoris: Secondary | ICD-10-CM | POA: Diagnosis not present

## 2023-07-14 DIAGNOSIS — E782 Mixed hyperlipidemia: Secondary | ICD-10-CM | POA: Diagnosis not present

## 2023-07-14 DIAGNOSIS — Z7189 Other specified counseling: Secondary | ICD-10-CM | POA: Diagnosis not present

## 2023-07-14 DIAGNOSIS — I10 Essential (primary) hypertension: Secondary | ICD-10-CM

## 2023-07-14 NOTE — Progress Notes (Signed)
 Cardiology Office Note:  .   Date:  07/14/2023  ID:  [name] [name], DOB 10-22-1960, MRN [id number] PCP: Elise Benne, MD  [facility] Providers Cardiologist:  Jodelle Red, MD Sleep Medicine:  Armanda Magic, MD {  History of Present Illness: .   [name] [name] is a 63 y.o. male  with a hx of CAD s/p remote PCI of PL in 2014 and prior stent of D2, hypertension, hyperlipidemia, OSA on CPAP, prior colon cancer who is seen in follow up for the evaluation and management of CAD.   CV history: LHC (09/12/13): LMCA normal. Normal LAD with patent mid vessel stent. D2 with 20-30% stenosis proximal to patent stent. LCx without significant disease. RCA with 20-30% disease. 20% ostial PDA stenosis. Patent stent in PL branch. LHC/PCI (08/22/12): LMCA normal. LAD with patent mid vessel stent. Small first diagonal branch with 80% ostial stenosis. Large second diagonal branch with 90% in-stent restenosis. LCx normal. Large, dominant RCA with mild luminal irregularities in the mid and distal vessel. PL branch has a 95% focal stenosis. LVEF 55-65%. Successful PTCA to D2 stent with AngioSculpt 2.5 x 10 mm balloon. Successful PCI to PL branch with a Promus Premier 2.25 x 12 mm drug-eluting stent.  Today: Doing well overall. Had to cut down trees recently, was asymptomatic and felt great after. Rare racing heartbeats, self limiting. He cut his metoprolol succinate from 25 mg to 12.5 mg daily as his resting HR was sometimes in the 40s.   Now retired. Doesn't sit still, very active.   Tolerating aspirin and clopidogrel, no bleeding issues. Enalapril was increased to 20 mg BID since last visit. Checks blood pressure at home, sees 120/60-125/70.  Reviewed labs from his PCP via his patient portal. Labs from 04/16/23 Tchol 131, HDL 44, TG 47, LDL 79.  ROS: Denies chest pain, shortness of breath at rest or with normal exertion. No PND, orthopnea, LE edema or unexpected weight gain. No syncope.  ROS otherwise negative except as noted.   Studies Reviewed: Marland Kitchen    EKG:  EKG Interpretation Date/Time:  Wednesday July 14 2023 07:54:57 EST Ventricular Rate:  67 PR Interval:  156 QRS Duration:  88 QT Interval:  376 QTC Calculation: 397 R Axis:   54  Text Interpretation: Normal sinus rhythm Normal ECG Confirmed by Jodelle Red [phone]) on 07/14/2023 8:14:26 AM    Physical Exam:   VS:  BP 138/82 (BP Location: Left Arm, Patient Position: Sitting, Cuff Size: Normal)   Pulse 67   Ht 5\' 10"  (1.778 m)   Wt 200 lb 14.4 oz (91.1 kg)   SpO2 98%   BMI 28.83 kg/m    Wt Readings from Last 3 Encounters:  07/14/23 200 lb 14.4 oz (91.1 kg)  07/10/22 197 lb (89.4 kg)  06/27/21 197 lb (89.4 kg)    GEN: Well nourished, well developed in no acute distress HEENT: Normal, moist mucous membranes NECK: No JVD CARDIAC: regular rhythm, normal S1 and S2, no rubs or gallops. No murmur. VASCULAR: Radial and DP pulses 2+ bilaterally. No carotid bruits RESPIRATORY:  Clear to auscultation without rales, wheezing or rhonchi  ABDOMEN: Soft, non-tender, non-distended MUSCULOSKELETAL:  Ambulates independently SKIN: Warm and dry, no edema NEUROLOGIC:  Alert and oriented x 3. No focal neuro deficits noted. PSYCHIATRIC:  Normal affect    ASSESSMENT AND PLAN: .    CAD s/p prior remote PCI, no current angina:  -continue aspirin 81 mg, clopidogrel 75 mg. He was told to be  on clopidogrel indefinitely. Tolerating without bleeding issues -continue metoprolol succinate 12.5 mg daily. Decreased from 35 mg due to bradycardia.   Hypertension:  -goal is <130/80, well controlled at home -Continue amlodipine 5 mg BID, enalapril 20 mg BID, metoprolol succinate 12.5  mg   Hyperlipidemia, mixed: LDL goal <70. TG goal <150 -continue rosuvastatin 10 mg, ezetimibe 10 mg daily, lovaza 2 gm twice a day for now -didn't tolerate higher doses of statin -discussed PCKS9i today. He will consider, would like to  recheck lipids in 6 mos first. Discussed that if we start PCSK9i, we could stop ezetimibe and lovaza. Lovaza is more for TG, if TG elevated on recheck, might need to restart -lipids in 6 mos, if not at goal, discuss PCSK9i again -discussed lp(a) today, he is amenable  CV risk counseling and prevention -recommend heart healthy/Mediterranean diet, with whole grains, fruits, vegetable, fish, lean meats, nuts, and olive oil. Limit salt. -recommend moderate walking, 3-5 times/week for 30-50 minutes each session. Aim for at least 150 minutes.week. Goal should be pace of 3 miles/hours, or walking 1.5 miles in 30 minutes -recommend avoidance of tobacco products. Avoid excess alcohol.   Dispo: labs in 6 mos, follow up in 1 year  Signed, Jodelle Red, MD   Jodelle Red, MD, PhD, [facility] [place]  [facility]  [place]  [facility] at [facility] at [facility] [address] [place], [place] [address] [phone]

## 2023-07-14 NOTE — Patient Instructions (Signed)
 Medication Instructions:   Your physician recommends that you continue on your current medications as directed. Please refer to the Current Medication list given to you today.   *If you need a refill on your cardiac medications before your next appointment, please call your pharmacy*   Lab Work:  Your physician recommends that you return for a FASTING lipid profile and LPA, fasting after midnight in 6 months.    If you have labs (blood work) drawn today and your tests are completely normal, you will receive your results only by: MyChart Message (if you have MyChart) OR A paper copy in the mail If you have any lab test that is abnormal or we need to change your treatment, we will call you to review the results.   Testing/Procedures:  None ordered.   Follow-Up: At [facility], you and your health needs are our priority.  As part of our continuing mission to provide you with exceptional heart care, we have created designated Provider Care Teams.  These Care Teams include your primary Cardiologist (physician) and Advanced Practice Providers (APPs -  Physician Assistants and Nurse Practitioners) who all work together to provide you with the care you need, when you need it.  We recommend signing up for the patient portal called "MyChart".  Sign up information is provided on this After Visit Summary.  MyChart is used to connect with patients for Virtual Visits (Telemedicine).  Patients are able to view lab/test results, encounter notes, upcoming appointments, etc.  Non-urgent messages can be sent to your provider as well.   To learn more about what you can do with MyChart, go to [email or web address].    Your next appointment:   As needed   Provider:       Other Instructions

## 2023-08-04 ENCOUNTER — Ambulatory Visit (INDEPENDENT_AMBULATORY_CARE_PROVIDER_SITE_OTHER): Payer: BC Managed Care – PPO | Admitting: Otolaryngology

## 2023-08-04 ENCOUNTER — Encounter (INDEPENDENT_AMBULATORY_CARE_PROVIDER_SITE_OTHER): Payer: Self-pay | Admitting: Otolaryngology

## 2023-08-04 VITALS — BP 155/81 | HR 84 | Ht 71.0 in | Wt 196.0 lb

## 2023-08-04 DIAGNOSIS — R053 Chronic cough: Secondary | ICD-10-CM | POA: Diagnosis not present

## 2023-08-04 DIAGNOSIS — K219 Gastro-esophageal reflux disease without esophagitis: Secondary | ICD-10-CM

## 2023-08-04 DIAGNOSIS — Z9889 Other specified postprocedural states: Secondary | ICD-10-CM

## 2023-08-04 DIAGNOSIS — G4733 Obstructive sleep apnea (adult) (pediatric): Secondary | ICD-10-CM

## 2023-08-04 DIAGNOSIS — Z8719 Personal history of other diseases of the digestive system: Secondary | ICD-10-CM

## 2023-08-04 DIAGNOSIS — J3089 Other allergic rhinitis: Secondary | ICD-10-CM

## 2023-08-04 DIAGNOSIS — K2270 Barrett's esophagus without dysplasia: Secondary | ICD-10-CM

## 2023-08-04 DIAGNOSIS — R0982 Postnasal drip: Secondary | ICD-10-CM

## 2023-08-04 DIAGNOSIS — R0981 Nasal congestion: Secondary | ICD-10-CM | POA: Diagnosis not present

## 2023-08-04 DIAGNOSIS — J329 Chronic sinusitis, unspecified: Secondary | ICD-10-CM

## 2023-08-04 MED ORDER — LEVOCETIRIZINE DIHYDROCHLORIDE 5 MG PO TABS: 5.0000 mg | ORAL_TABLET | Freq: Every evening | ORAL | 3 refills | Status: DC

## 2023-08-04 NOTE — Progress Notes (Signed)
 ENT CONSULT:  Reason for Consult: recurrent sinus infections chronic nasal congestion    HPI: Discussed the use of AI scribe software for clinical note transcription with the patient, who gave verbal consent to proceed.  History of Present Illness   [name] [name] is a 63 year old male with a history of OSA on CPAP, hx of chronic sinusitis and nasal polyps, s/p sinus surgeries x 2 last one 32 years ago, who presents with recurrent sinus infections and difficulty breathing through his nose.  He has experienced approximately four sinus infections over the past year, with difficulty breathing through his nose, particularly when lying down at night. He uses a CPAP machine. He takes Allegra daily for allergies and uses Flonase nasal spray. His left nasal passage is usually better for breathing. For past sinus infections, he has been treated with penicillin and amoxicillin, depending on the response to treatment.  He has a history of nasal polyps and has undergone two sinus surgeries in the past, the last of which was 32 years ago. He performs nasal rinses with salt water almost every night, using a squeeze bottle. He experiences postnasal drainage and frequent eye drainage. His sense of smell is intact. He has not had allergy shots since undergoing chemotherapy for colon cancer, but was on allergy shots before.  He mentions a persistent cough, which is not severe but frequent, and he has a history of Barrett's esophagus. He experiences occasional heartburn and is on Dexilant. He also uses a CPAP machine regularly.  He has a history of colon cancer diagnosed while he was in his 62's that metastasized to the lung, for which he underwent chemotherapy with leucovorin and 5FU, completed in February 1999. He reports  family history of cancer, including a cousin who died of brain cancer, but no known familial colon cancer. His children have undergone colonoscopies early due to his history.      Records  Reviewed:   Cards office visit 07/14/23 [name] [name] is a 63 y.o. male  with a hx of CAD s/p remote PCI of PL in 2014 and prior stent of D2, hypertension, hyperlipidemia, OSA on CPAP, prior colon cancer who is seen in follow up for the evaluation and management of CAD.   CV history: LHC (09/12/13): LMCA normal. Normal LAD with patent mid vessel stent. D2 with 20-30% stenosis proximal to patent stent. LCx without significant disease. RCA with 20-30% disease. 20% ostial PDA stenosis. Patent stent in PL branch. LHC/PCI (08/22/12): LMCA normal. LAD with patent mid vessel stent. Small first diagonal branch with 80% ostial stenosis. Large second diagonal branch with 90% in-stent restenosis. LCx normal. Large, dominant RCA with mild luminal irregularities in the mid and distal vessel. PL branch has a 95% focal stenosis. LVEF 55-65%. Successful PTCA to D2 stent with AngioSculpt 2.5 x 10 mm balloon. Successful PCI to PL branch with a Promus Premier 2.25 x 12 mm drug-eluting stent.   Today: Doing well overall. Had to cut down trees recently, was asymptomatic and felt great after. Rare racing heartbeats, self limiting. He cut his metoprolol succinate from 25 mg to 12.5 mg daily as his resting HR was sometimes in the 40s.    Now retired. Doesn't sit still, very active.    Tolerating aspirin and clopidogrel, no bleeding issues. Enalapril was increased to 20 mg BID since last visit. Checks blood pressure at home, sees 120/60-125/70.    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 s/p resection/chemo   Allergy    SEASONAL   Anginal pain (HCC)    BARRETT'S ESOPHAGUS, HX OF    CAD    a. 2007: DES to LAD and Diag;  b. 08/2011 non-ischemic myoview, EF 60%;  c. 08/2012 NSTEMI/Cath/PCI: LM n, LAD patent stent, D1 small, 80, D2 90 ISR (PTCA), LCX nl, RCA 20d, RPL 95 (2.25x12 Promus Premier DES), EF 55-65%.   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 s/p wedge resection   Sleep apnea     Past Surgical History:  Procedure Laterality Date   COLON RESECTION  12/29/1996   CORONARY STENT PLACEMENT  05/18/2005   taxus   INCISIONAL HERNIA REPAIR N/A 02/04/2021   Procedure: INCISIONAL HERNIA REPAIR;  Surgeon: Abigail Miyamoto, MD;  Location[facility] OR;  Service: General;  Laterality: N/A;   INGUINAL HERNIA REPAIR N/A 05/31/2017   Procedure: BILATERAL LAPAROSCOPIC INGUINAL HERNIA REPAIR WITH MESH;  Surgeon: Abigail Miyamoto, MD;  Location: [facility] OR;  Service: General;  Laterality: N/A;   INSERTION OF MESH Bilateral 05/31/2017   Procedure: INSERTION OF MESH;  Surgeon: Abigail Miyamoto, MD;  Location: [facility] OR;  Service: General;  Laterality: Bilateral;   INSERTION OF MESH N/A 02/04/2021   Procedure: INSERTION OF MESH;  Surgeon: Abigail Miyamoto, MD;  Location: [facility] OR;  Service: General;  Laterality: N/A;   KNEE SURGERY     LEFT HEART CATH AND CORONARY ANGIOGRAPHY N/A 08/31/2018   Procedure: LEFT HEART CATH AND CORONARY ANGIOGRAPHY;  Surgeon: Marykay Lex, MD;  Location: [facility] INVASIVE CV LAB;  Service: Cardiovascular;  Laterality: N/A;   LEFT HEART CATHETERIZATION WITH CORONARY ANGIOGRAM N/A 08/22/2012   Procedure: LEFT HEART CATHETERIZATION WITH CORONARY ANGIOGRAM;  Surgeon: Peter M Swaziland, MD;  Location[facility] CATH LAB;  Service: Cardiovascular;  Laterality: N/A;   LEFT HEART CATHETERIZATION WITH CORONARY ANGIOGRAM N/A 09/12/2013   Procedure: LEFT HEART CATHETERIZATION WITH CORONARY ANGIOGRAM;  Surgeon: Lennette Bihari, MD;  Location: [facility] CATH LAB;  Service: Cardiovascular;  Laterality: N/A;   LUNG REMOVAL, PARTIAL  1999   LEFT   TONSILLECTOMY      Family History  Problem Relation Age of Onset   Heart disease Mother        alive @ 68.  s/p cabg   Colon polyps Mother     Heart disease Father        alive @ 16.  s/p stenting   Heart attack Paternal Uncle        @ 19   Heart attack Paternal Uncle        @ 92   Colon cancer Neg Hx     Social History:  reports that he has never smoked. He has never used smokeless tobacco. He reports that he does not drink alcohol and does not use drugs.  Allergies:  Allergies  Allergen Reactions   Shellfish-Derived Products Anaphylaxis, Swelling and Other (See Comments)    Large amounts of crab   Isosorbide Other (See Comments)    Pt unsure    Levofloxacin Other (See Comments)    Other reaction(s): patient refused I am wirried about tendon and joint   Clarithromycin Diarrhea and Rash    Medications: I have reviewed the patient's current medications.  The PMH,  PSH, Medications, Allergies, and SH were reviewed and updated.  ROS: Constitutional: Negative for fever, weight loss and weight gain. Cardiovascular: Negative for chest pain and dyspnea on exertion. Respiratory: Is not experiencing shortness of breath at rest. Gastrointestinal: Negative for nausea and vomiting. Neurological: Negative for headaches. Psychiatric: The patient is not nervous/anxious  Blood pressure (!) 155/81, pulse 84, height 5\' 11"  (1.803 m), weight 196 lb (88.9 kg), SpO2 98%. Body mass index is 27.34 kg/m.  PHYSICAL EXAM:  Exam: General: Well-developed, well-nourished Respiratory Respiratory effort: Equal inspiration and expiration without stridor Cardiovascular Peripheral Vascular: Warm extremities with equal color/perfusion Eyes: No nystagmus with equal extraocular motion bilaterally Neuro/Psych/Balance: Patient oriented to person, place, and time; Appropriate mood and affect; Gait is intact with no imbalance; Cranial nerves I-XII are intact Head and Face Inspection: Normocephalic and atraumatic without mass or lesion Palpation: Facial skeleton intact without bony stepoffs Salivary Glands: No mass or tenderness Facial Strength:  Facial motility symmetric and full bilaterally ENT Pinna: External ear intact and fully developed External canal: Canal is patent with intact skin Tympanic Membrane: Clear and mobile External Nose: No scar or anatomic deformity Internal Nose: Septum is relatively straight. No polyp, or purulence. Mucosal edema and erythema present.  Bilateral post-op changes s/p FESS ethmoid area clear and maxillary sinuses clear no polyps Lips, Teeth, and gums: Mucosa and teeth intact and viable TMJ: No pain to palpation with full mobility Oral cavity/oropharynx: No erythema or exudate, no lesions present Nasopharynx: No mass or lesion with intact mucosa Hypopharynx: Intact mucosa without pooling of secretions Larynx Glottic: Full true vocal cord mobility without lesion or mass Supraglottic: Normal appearing epiglottis and AE folds Interarytenoid Space: Moderate pachydermia&edema Subglottic Space: Patent without lesion or edema Neck Neck and Trachea: Midline trachea without mass or lesion Thyroid: No mass or nodularity Lymphatics: No lymphadenopathy  Procedure: Preoperative diagnosis: chronic cough   Postoperative diagnosis:   Same  Procedure: Flexible fiberoptic laryngoscopy  Surgeon: Ashok Croon, MD  Anesthesia: Topical lidocaine and Afrin Complications: None Condition is stable throughout exam  Indications and consent:  The patient presents to the clinic with above symptoms. Indirect laryngoscopy view was incomplete. Thus it was recommended that they undergo a flexible fiberoptic laryngoscopy. All of the risks, benefits, and potential complications were reviewed with the patient preoperatively and verbal informed consent was obtained.  Procedure: The patient was seated upright in the clinic. Topical lidocaine and Afrin were applied to the nasal cavity. After adequate anesthesia had occurred, I then proceeded to pass the flexible telescope into the nasal cavity. The nasal cavity was  patent without rhinorrhea or polyp. The nasopharynx was also patent without mass or lesion. The base of tongue was visualized and was normal. There were no signs of pooling of secretions in the piriform sinuses. The true vocal folds were mobile bilaterally. There were no signs of glottic or supraglottic mucosal lesion or mass. There was moderate interarytenoid pachydermia and post cricoid edema. The telescope was then slowly withdrawn and the patient tolerated the procedure throughout.    PROCEDURE NOTE: nasal endoscopy  Preoperative diagnosis: chronic sinusitis symptoms  Postoperative diagnosis: same  Procedure: Diagnostic nasal endoscopy (96045)  Surgeon: Ashok Croon, M.D.  Anesthesia: Topical lidocaine and Afrin  H&P REVIEW: The patient's history and physical were reviewed today prior to procedure. All medications were reviewed and updated as well. Complications: None Condition is stable throughout exam Indications and consent: The patient presents with symptoms of chronic sinusitis not responding to previous therapies. All the  risks, benefits, and potential complications were reviewed with the patient preoperatively and informed consent was obtained. The time out was completed with confirmation of the correct procedure.   Procedure: The patient was seated upright in the clinic. Topical lidocaine and Afrin were applied to the nasal cavity. After adequate anesthesia had occurred, the rigid nasal endoscope was passed into the nasal cavity. The nasal mucosa, turbinates, septum, and sinus drainage pathways were visualized bilaterally. This revealed no purulence or significant secretions that might be cultured. There were no polyps or sites of significant inflammation. The mucosa was intact and there was no crusting present. The scope was then slowly withdrawn and the patient tolerated the procedure well. There were no complications or blood loss.   Studies Reviewed: CXR  04/04/21 FINDINGS: Cardiac shadow is within normal limits. Lungs are well aerated bilaterally. Minimal scarring is noted in the left base as well as prior postsurgical changes. Mild volume loss on the left is noted. No bony abnormality is seen.   IMPRESSION: Postsurgical changes with mild volume loss. No acute abnormality noted.  Assessment/Plan: Encounter Diagnoses  Name Primary?   Chronic nasal congestion Yes   OSA on CPAP    History of Barrett's esophagus    Chronic sinusitis, unspecified location    Chronic cough [R05.3]    Chronic GERD [K21.9]    Environmental and seasonal allergies    Post-nasal drip    History of sinus surgery     Assessment and Plan    Chronic Nasal Congestion Environmental Allergies  Reports four sinus infections in the last 12 months with difficulty breathing through his nasal passages at times, especially at night with CPAP in place. Hx of CRS and Nasal polyps and two previous sinus surgeries over 30 years ago. Current examination shows no polyp regrowth and sinuses are wide open with no signs of chronic sinus disease. Symptoms likely related to allergies rather than sinus infections. Sinus surgery is not curative but facilitates maintenance treatments like nasal rinses. No need for CT scan as sinuses appear clear on examination. Had allergy shots in the past.  - Continue nasal rinses with saline - Continue Flonase nasal spray - Switch from Allegra to Xyzal to see if it improves symptoms - will consider adding steroids to the rinses if sx persist   Chronic Cough Chronic cough potentially related to postnasal drainage, gastroesophageal reflux disease (GERD), or neurogenic cough. He also had lung wedge excision when undergoing treatment for colon cancer with mets to the lung. Last CXR 2022 was clear. On Dexilant for reflux with Barrett's esophagus noted on EGD in the past. CPAP use may contribute to postnasal drainage. Discussed potential treatments  including dietary and lifestyle changes, and use of Reflux Gourmet.  - Continue Dexilant for reflux - Review dietary and lifestyle changes for reflux using [facility] resources - Reflux Gourmet after meals, especially at night - Consider further evaluation if symptoms persist despite current management - will consider chest imaging if cough persists   Barrett's Esophagus Barrett's esophagus, currently on Dexilant for reflux management. Endoscopy five years ago showed no Barrett's esophagus. Follow-up endoscopy scheduled for May 29. - Continue Dexilant for reflux - Proceed with scheduled upper endoscopy on May 29      Thank you for allowing me to participate in the care of this patient. Please do not hesitate to contact me with any questions or concerns.   Ashok Croon, MD Otolaryngology [facility] Phone: [phone] Fax: [phone]    08/04/2023,  5:54 PM

## 2023-08-04 NOTE — Patient Instructions (Signed)

## 2023-10-12 IMAGING — CR DG CHEST 2V
2 series · 2 of 2 positions shown · non-contrast
Comparison: 08/30/2018

CLINICAL DATA: Chest pain and tachycardia

EXAM:
CHEST - 2 VIEW

[chest pa]
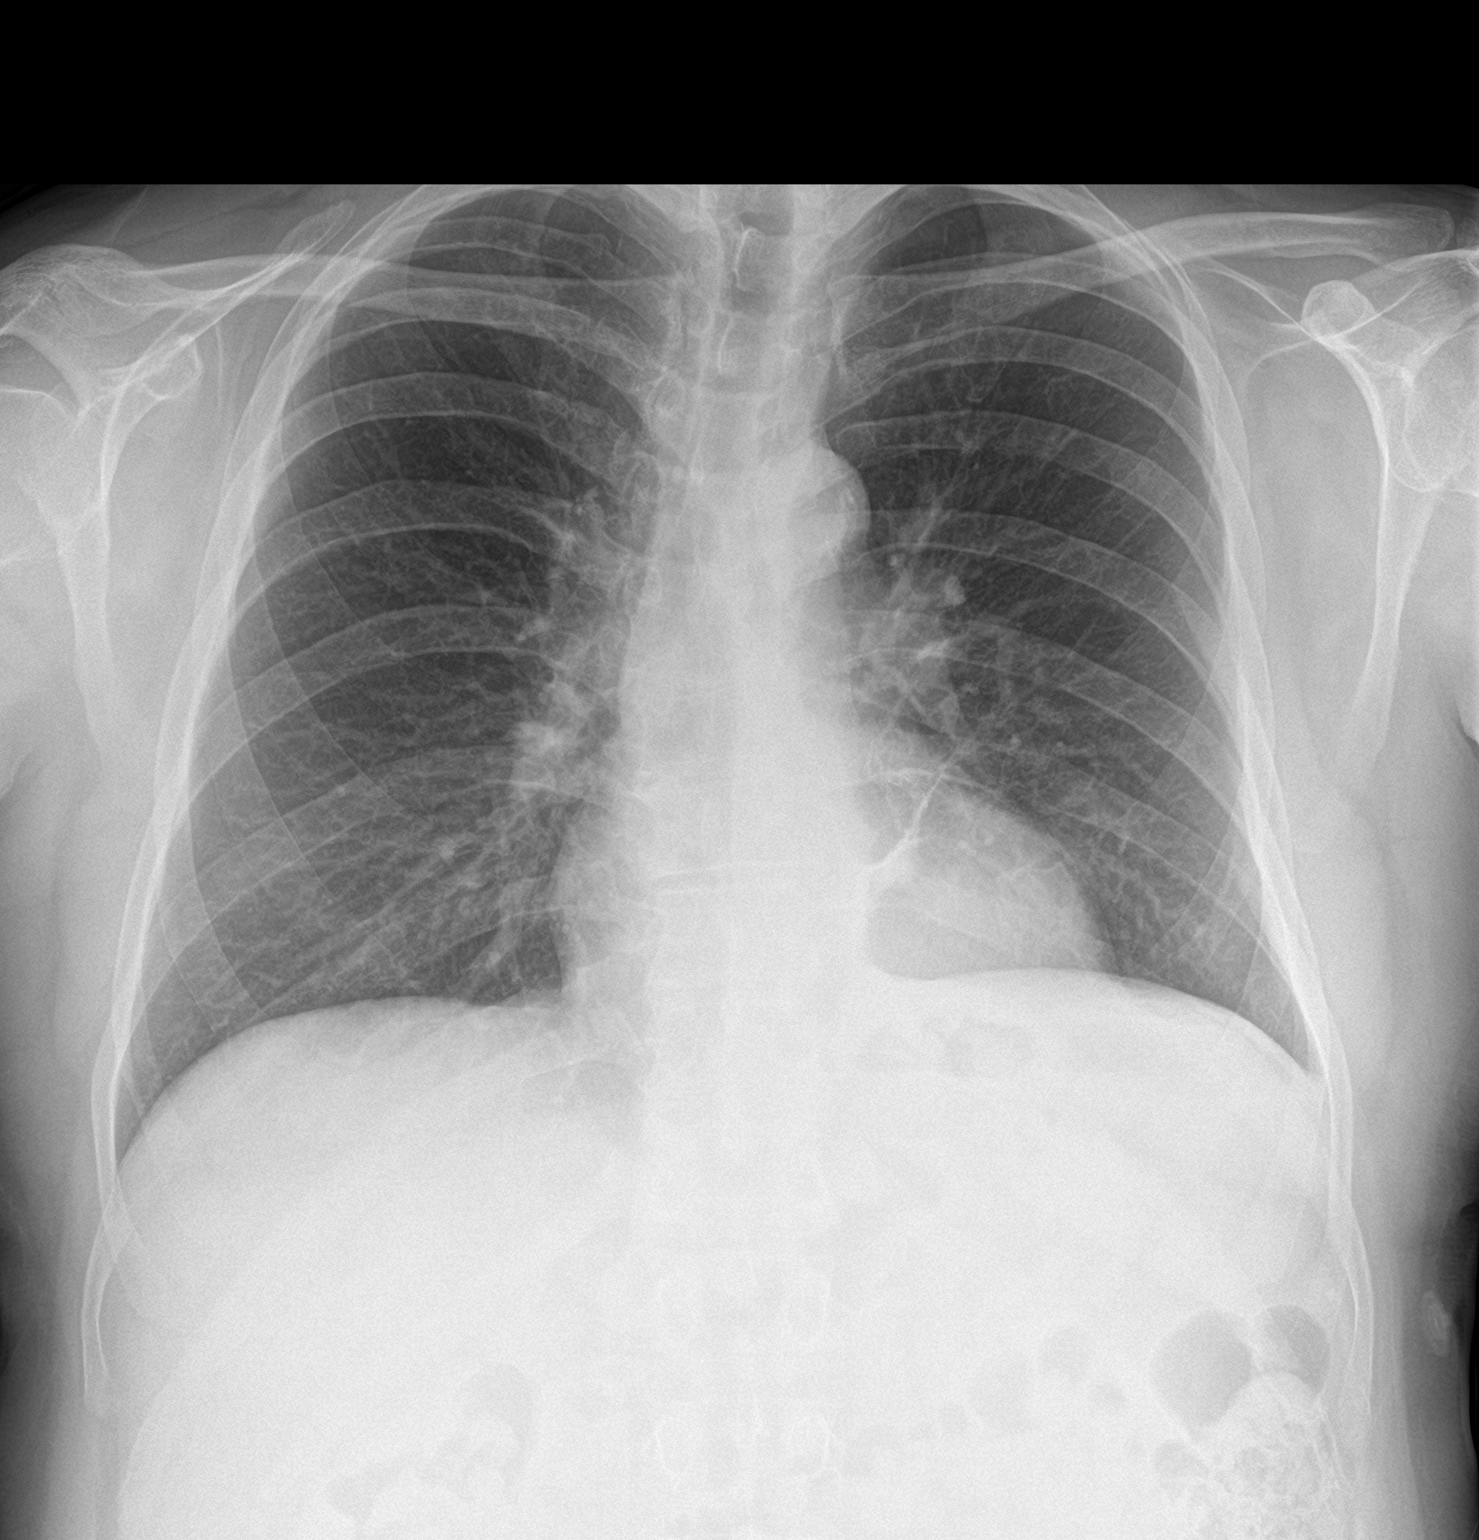

[chest lat]
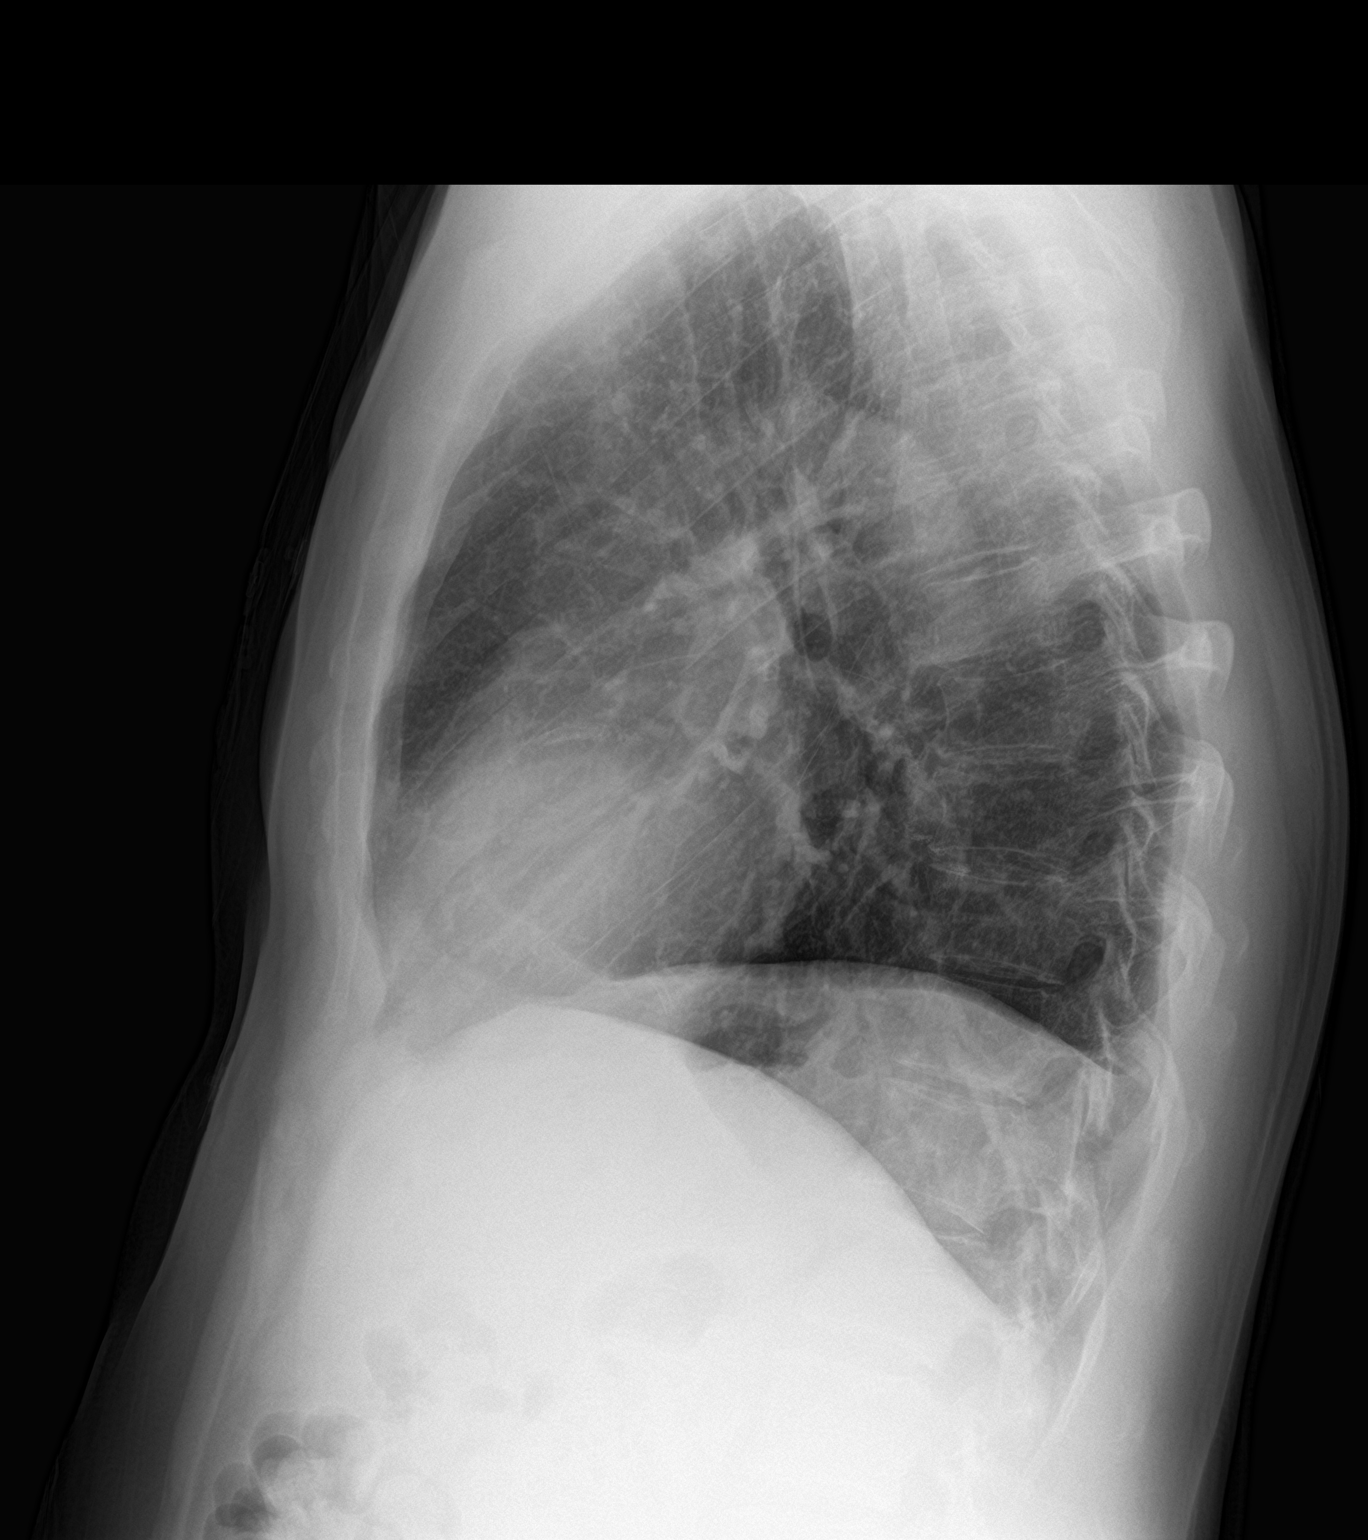

[2 of 2 positions shown; findings below may reference images not displayed]

FINDINGS: Cardiac shadow is within normal limits. Lungs are well aerated
bilaterally. Minimal scarring is noted in the left base as well as
prior postsurgical changes. Mild volume loss on the left is noted.
No bony abnormality is seen.
IMPRESSION: Postsurgical changes with mild volume loss. No acute abnormality
noted.

## 2023-10-14 NOTE — H&P (Signed)
 PRE-PROCEDURE HISTORY AND PHYSICAL EXAM  [name] [name] presents for his scheduled COLONOSCOPY, FLEXIBLE, PROXIMAL TO SPLENIC FLEXURE; DIAGNOSTIC, W/WO COLLECTION SPECIMEN BY BRUSH OR WASH UGI ENDOSCOPY; WITH BIOPSY, SINGLE OR MULTIPLE.   The indication for the procedure(s) is Personal history of other malignant neoplasm of large intestine [Z85.038]& Barrett's esophagus without dysplasia [K22.70].    There have been no significant recent changes in the patient's medical status.  Past Medical History:  Diagnosis Date  . Anemia 2013   Iron deficiency  . Barrett's esophagus 02/2010   On surveillance; negative for Barrett's 02/22/15, and 02/2012; 02/22/15 w/ Bx negative for Barrett's or H Pylori  . CAD (coronary artery disease) 2007   Status post stent 2  . Chronic anterior anal fissure   . Colon cancer      Resected in 1997, status post 5-FU based adjuvant therapy for 6 months, with pulmonary relapses resected in 1998 and 2000  . Hemorrhoids, internal, with bleeding   . Hyperlipidemia   . Hypertension   . Kidney stones   . Multiple gastric polyps 02/22/2015   Biopsy benign;   . OSA on CPAP   . Seasonal allergies    Past Surgical History:  Procedure Laterality Date  . LUNG SURGERY  1998, 2000   Wedge resection of colorectal metastases  . PR COLONOSCOPY W/BIOPSY SINGLE/MULTIPLE N/A 06/09/2018   Procedure: COLONOSCOPY, FLEXIBLE, PROXIMAL TO SPLENIC FLEXURE; WITH BIOPSY, SINGLE OR MULTIPLE;  Surgeon: Eleanor Dewey Sorrel, [name];  [facility];  Service: Gastroenterology  . PR UPPER GI ENDOSCOPY,BIOPSY N/A 06/09/2018   Procedure: UGI ENDOSCOPY; WITH BIOPSY, SINGLE OR MULTIPLE;  Surgeon: Eleanor Dewey Sorrel, [name];  Location: [facility];  Service: Gastroenterology    Allergies Allergies  Allergen Reactions  . Shellfish Containing Products Anaphylaxis, Other (See Comments) and Swelling    Other reaction(s): SWELLING  Large amounts of crab  .  Isosorbide      Other Reaction(s): Other (See Comments), Unknown  Pt unsure  . Levofloxacin     Other Reaction(s): Other (See Comments), patient refused, Unknown  Other reaction(s): patient refused  I am wirried about tendon and joint  I am wirried about tendon and joint  Other reaction(s): patient refused  I am wirried about tendon and joint  I am wirried about tendon and joint  . Clarithromycin Diarrhea and Rash    Medications amlodipine , ascorbic acid  (vitamin C), (cholecalciferol  (vitamin D3-50 mcg (2,000 unit))), clopidogrel , cyanocobalamin  (vitamin B-12), dexlansoprazole, enalapril , ezetimibe , fexofenadine, ibuprofen , metoPROLOL  succinate, mometasone, montelukast , multivit with min-folic acid, mv-mn-FA-D3-lycopene-lut-coQ10, nifedipine 0.3% lidocaine  1.5% in petrolatum ointment, nitroglycerin , omega-3 acid ethyl esters, polyvinyl alcohol , rosuvastatin , tadalafil, triamcinolone, and zinc  gluconate  Physical Examination Vitals:   10/14/23 0930  BP: 129/71  Pulse: 51  Resp: 16  Temp: 36.5 C (97.7 F)  SpO2: 98%   Body mass index is 27.16 kg/m.  Mental Status: AAOx3, thoughts organized   Lungs: Clear to auscultation, unlabored breathing   Heart: Regular rate and rhythm, normal S1 and S2, no murmur   Abdomen: Soft, non-tender, non-distended     ASSESSMENT AND PLAN [name] [name] has been evaluated and deemed appropriate to undergo the planned COLONOSCOPY, FLEXIBLE, PROXIMAL TO SPLENIC FLEXURE; DIAGNOSTIC, W/WO COLLECTION SPECIMEN BY BRUSH OR WASH UGI ENDOSCOPY; WITH BIOPSY, SINGLE OR MULTIPLE.  The patient, or his authorized representative, was provided an explanation of the nature and benefits of the procedure(s), the most frequent risks, and alternatives, if any.  I personally reviewed this  information with the patient, or his authorized representative, and answered all questions. [name] [name]

## 2023-12-09 ENCOUNTER — Other Ambulatory Visit: Payer: Self-pay | Admitting: Surgery

## 2023-12-10 ENCOUNTER — Telehealth: Payer: Self-pay

## 2023-12-10 NOTE — Telephone Encounter (Signed)
   Pre-operative Risk Assessment    Patient Name: [name] [name]  DOB: Aug 09, 1960 MRN: [id number]   Date of last office visit: 07/14/2023, Dr. Lonni Slain, MD Date of next office visit: NONE   Request for Surgical Clearance    Procedure:  Hernia surgery  Date of Surgery:  Clearance TBD                                Surgeon: Dr. [name], MD Surgeon's Group or Practice Name: [facility] Phone number: [phone] Fax number: [phone]   Type of Clearance Requested:   - Medical  - Pharmacy:  Hold Aspirin      Type of Anesthesia:  General    Additional requests/questions:    SignedAsberry KANDICE Dunning   12/10/2023, 5:07 PM

## 2023-12-14 NOTE — Telephone Encounter (Signed)
   Name: SHELIA MAGALLON  DOB: Nov 10, 1960  MRN: [id number]  Primary Cardiologist: Shelda Bruckner, MD  Chart reviewed as part of pre-operative protocol coverage. Because of Kreig Parson Berrey's past medical history and time since last visit, he will require a follow-up in-office visit in order to better assess preoperative cardiovascular risk. Patient resides in [place] so cannot schedule tele visit.   Pre-op covering staff: - Please schedule appointment and call patient to inform them. If patient already had an upcoming appointment within acceptable timeframe, please add pre-op clearance to the appointment notes so provider is aware. - Please contact requesting surgeon's office via preferred method (i.e, phone, fax) to inform them of need for appointment prior to surgery.   Barnie Hila, NP  12/14/2023, 1:21 PM

## 2023-12-14 NOTE — Telephone Encounter (Signed)
Preop clearance appt scheduled.

## 2023-12-23 ENCOUNTER — Encounter (HOSPITAL_BASED_OUTPATIENT_CLINIC_OR_DEPARTMENT_OTHER): Payer: Self-pay | Admitting: Nurse Practitioner

## 2023-12-23 ENCOUNTER — Ambulatory Visit (HOSPITAL_BASED_OUTPATIENT_CLINIC_OR_DEPARTMENT_OTHER): Admitting: Nurse Practitioner

## 2023-12-23 VITALS — BP 138/82 | HR 73 | Ht 71.0 in | Wt 202.9 lb

## 2023-12-23 DIAGNOSIS — I2510 Atherosclerotic heart disease of native coronary artery without angina pectoris: Secondary | ICD-10-CM | POA: Diagnosis not present

## 2023-12-23 DIAGNOSIS — R9431 Abnormal electrocardiogram [ECG] [EKG]: Secondary | ICD-10-CM

## 2023-12-23 DIAGNOSIS — I10 Essential (primary) hypertension: Secondary | ICD-10-CM | POA: Diagnosis not present

## 2023-12-23 DIAGNOSIS — E785 Hyperlipidemia, unspecified: Secondary | ICD-10-CM

## 2023-12-23 DIAGNOSIS — Z01810 Encounter for preprocedural cardiovascular examination: Secondary | ICD-10-CM | POA: Diagnosis not present

## 2023-12-23 NOTE — Patient Instructions (Signed)
 Medication Instructions:   Your physician recommends that you continue on your current medications as directed. Please refer to the Current Medication list given to you today.   *If you need a refill on your cardiac medications before your next appointment, please call your pharmacy*  Lab Work:  Your physician recommends that you return for a FASTING lipid profile, fasting after midnight at any [facility]. 1 week prior to your appointment in  February.    If you have labs (blood work) drawn today and your tests are completely normal, you will receive your results only by: MyChart Message (if you have MyChart) OR A paper copy in the mail If you have any lab test that is abnormal or we need to change your treatment, we will call you to review the results.  Testing/Procedures:  Your physician has requested that you have an echocardiogram. Echocardiography is a painless test that uses sound waves to create images of your heart. It provides your doctor with information about the size and shape of your heart and how well your heart's chambers and valves are working. This procedure takes approximately one hour. There are no restrictions for this procedure. Please do NOT wear cologne,aftershave, or lotions (deodorant is allowed). Please arrive 15 minutes prior to your appointment time.  Follow-Up: At [facility], you and your health needs are our priority.  As part of our continuing mission to provide you with exceptional heart care, our providers are all part of one team.  This team includes your primary Cardiologist (physician) and Advanced Practice Providers or APPs (Physician Assistants and Nurse Practitioners) who all work together to provide you with the care you need, when you need it.  Your next appointment:   6 month(s)  Provider:   Shelda Bruckner, MD    We recommend signing up for the patient portal called MyChart.  Sign up information is provided on this After Visit  Summary.  MyChart is used to connect with patients for Virtual Visits (Telemedicine).  Patients are able to view lab/test results, encounter notes, upcoming appointments, etc.  Non-urgent messages can be sent to your provider as well.   To learn more about what you can do with MyChart, go to [email or web address].   Other Instructions  Your physician wants you to follow-up in: 6 months with fasting labs the week prior. You will receive a reminder letter in the mail two months in advance. If you don't receive a letter, please call our office to schedule the follow-up appointment.

## 2023-12-23 NOTE — Progress Notes (Addendum)
 Cardiology Office Note   Date:  12/23/2023  ID:  [name] [name], DOB 05/06/1961, MRN [id number] PCP: Toy Laurance POUR, MD  [facility] Providers Cardiologist:  Shelda Bruckner, MD Sleep Medicine:  Wilbert Bihari, MD     PMH CAD S/p remote PCI of PL in 2014 and prior stent of D2 Hypertension Hyperlipidemia OSA on CPAP Colon cancer Family history heart disease in both parents   CV History:  LHC/PCI (08/22/2012): LMCA normal.  LAD with patent mid vessel stent.  Small first diagonal branch with 80% ostial stenosis.  Large second diagonal branch with 90% ISR.  LCx normal.  Large nondominant RCA with mild luminal irregularities in the mid and distal vessel.  PL branch has 95% focal stenosis.  LVEF 55 to 65%.  Successful PTCA to D2 stent with angio sculpt 2.5 x 10 mm balloon.  Successful PCI to PL branch with Promus Premier 2.25 x 12 mm DES. LHC (09/12/2013): LMCA normal.  Normal LAD with patent mid vessel stent.  D2 with 20 to 30% stenosis proximal to patent stent.  LCx without significant disease.  RCA with 20 to 30% disease.  20% ostial PDA stenosis.  Patent stent in PL branch LHC (08/31/2018): Previously placed prox LAD to mid LAD DES widely patent. Previously placed Ost 1st diag-2 stent is 5% stenosed. Previously placed post Atrio DES widely patent. Otherwise minimal CAD.   He established care with Dr. Bruckner in 2019 and has maintained consistent follow-up.  Last cardiology clinic visit was 07/14/2023 with Dr. Bruckner at which time he reported he had recently cut trees down, was asymptomatic and felt great afterwards.  Rare racing heartbeats, self-limiting.  He had reduced his metoprolol  succinate from 25 to 12.5 mg daily due to resting heart rate sometimes in the 40s.  He had retired from his job but remained active.  Since last visit, enalapril  had been increased to 20 mg twice daily with home BP 120/60-125/70.  LDL was 79.  He did not tolerate higher doses of statin and  continued rosuvastatin  10 mg daily, ezetimibe  10 mg daily, and Lovaza  2 g twice daily.  He was told to be on clopidogrel  indefinitely.  He also continued aspirin  with no bleeding issues.  Plan was to recheck lipids and LP(a) in 6 months and consider PCSK9 inhibitor therapy.  History of Present Illness Discussed the use of AI scribe software for clinical note transcription with the patient, who gave verbal consent to proceed.  History of Present Illness [name] [name] is a very pleasant 63 year old male who is here today for preoperative cardiac evaluation prior to hernia surgery. Reports he is feeling well and remains very active, engaging in activities such as mowing, shoveling, and playing with his grandchildren without experiencing chest pain or shortness of breath. He can walk long distances, climb stairs, and perform heavy lifting without difficulty. No exertional symptoms are present. His weight is stable with the exception of recent 4 lb weight gain secondary to steroid therapy for poison ivy exposure on his arms. He maintains good energy levels and sleeps well. Symptoms of angina prior to previous PCI include pressure in his left chest and left arm and pressure between his shoulder blades. He had previously reduced Toprol  all due to HR consistently < 50 bpm.  Since being on the lower dose, HR ranges between 60 and 80 bpm. His blood pressure is usually well-controlled, though he forgot to take his medication this morning. SBP at home typically 118-122 mmHg.  Family history is significant for heart disease, with his mother having had a triple bypass and his father having had stents placed.    ROS: See HPI  Studies Reviewed EKG Interpretation Date/Time:  Thursday December 23 2023 08:29:36 EDT Ventricular Rate:  66 PR Interval:  142 QRS Duration:  96 QT Interval:  364 QTC Calculation: 381 R Axis:   26  Text Interpretation: Normal sinus rhythm Minimal voltage criteria for LVH, may be normal  variant ( R in aVL ) When compared with ECG of 14-Jul-2023 07:54, T wave inversion now evident in Inferior leads Confirmed by Percy Browning [phone]) on 12/23/2023 8:44:38 AM     No results found for: LIPOA  Risk Assessment/Calculations           Physical Exam VS:  BP 138/82   Pulse 73   Ht 5' 11 (1.803 m)   Wt 202 lb 14.4 oz (92 kg)   SpO2 96%   BMI 28.30 kg/m    Wt Readings from Last 3 Encounters:  12/23/23 202 lb 14.4 oz (92 kg)  08/04/23 196 lb (88.9 kg)  07/14/23 200 lb 14.4 oz (91.1 kg)    GEN: Well nourished, well developed in no acute distress NECK: No JVD; No carotid bruits CARDIAC: RRR, no murmurs, rubs, gallops RESPIRATORY:  Clear to auscultation without rales, wheezing or rhonchi  ABDOMEN: Soft, non-tender, non-distended EXTREMITIES:  No edema; No deformity   Assessment & Plan Preoperative cardiac evaluation According to the Revised Cardiac Risk Index (RCRI), his Perioperative Risk of Major Cardiac Event is (%): 0.9. His Functional Capacity in METs is: 9.89 according to the Duke Activity Status Index (DASI). The patient is doing well from a cardiac perspective with no concerning symptoms, however he has new TWI on EKG today. We will get echo to evaluate for wall motion abnormality.  He is planning to undergo hernia surgery in October.  Advised we will await test results prior to providing clearance.  In regards to DAPT, he may hold Plavix  for 5 days prior to surgery and should resume as soon as hemodynamically stable post procedure. Ideally aspirin  should be continued without interruption, however if the bleeding risk is too great, aspirin  may be held for 5-7 days prior to surgery. Please resume aspirin  post operatively when it is felt to be safe from a bleeding standpoint.  Addendum: Echo reveals normal heart function and only mild LVH. No regional wall motion abnormalities. Therefore, based on ACC/AHA guidelines, the patient would be at acceptable risk for the planned  procedure without further cardiovascular testing.     Coronary artery disease  History of prior stenting with most recent cardiac cath from 2020 that revealed patent stents, otherwise mild nonobstructive CAD.  New T wave inversions on EKG today. He remains active and denies chest pain, dyspnea, or other symptoms concerning for angina. No bleeding concerns. BP is well controlled.  -We will get echo to evaluate heart function and ensure no wall motion abnormality -Advisement for preoperative DAPT hold provided -Continue GDMT   Abnormal EKG with new T wave inversion   EKG today shows new T wave inversions in lead III. This is a new finding when compared to previous EKG, most recent of which was 07/14/23.  He denies shortness of breath, chest pain, orthopnea, PND, edema, presyncope, syncope, or activity intolerance. - Get echocardiogram to evaluate heart function and to evaluate for abnormal wall motion  Hypertension   Blood pressure mildly elevated in clinic today. Home SBP typically ranges  from 118-122. He did not take anti-hypertensives prior to arrival today. No change in anti-hypertensive therapy. He reports stable renal function per PCP, no labs to review.  -Continue low dose Toprol  XL -Continue amlodipine , enalapril , rosuvastatin   Hyperlipidemia LDL goal < 70 From PCP 04/16/2023 with total cholesterol 131, HDL 44, triglycerides 47, LDL 79. Plan to repeat lipids and follow-up with Dr. Lonni Feb 2026.  -Continue plan for follow-up lab testing -Continue rosuvastatin  10 mg daily, ezetimibe  10 mg daily, Lovaza  -Consider additional lipid lowering therapy if LDL remains >  70 -Focus on heart healthy, mostly whole food diet limiting saturated fat, sugar, simple carbohydrates, and processed food         Dispo: February 2026 with Dr. Lonni  Signed, Rosaline Bane, NP-C

## 2023-12-29 ENCOUNTER — Ambulatory Visit: Admitting: Physician Assistant

## 2024-01-25 ENCOUNTER — Other Ambulatory Visit (HOSPITAL_BASED_OUTPATIENT_CLINIC_OR_DEPARTMENT_OTHER)

## 2024-01-25 DIAGNOSIS — R9431 Abnormal electrocardiogram [ECG] [EKG]: Secondary | ICD-10-CM | POA: Diagnosis not present

## 2024-01-25 DIAGNOSIS — Z01810 Encounter for preprocedural cardiovascular examination: Secondary | ICD-10-CM

## 2024-01-25 LAB — ECHOCARDIOGRAM COMPLETE
Area-P 1/2: 3.31 cm2
S' Lateral: 2.51 cm

## 2024-01-26 ENCOUNTER — Ambulatory Visit: Payer: Self-pay | Admitting: Nurse Practitioner

## 2024-02-11 NOTE — Progress Notes (Signed)
 Surgical Instructions   Your procedure is scheduled on Thursday, October 2nd, 2025. Report to [facility] Main Entrance A at 5:30 A.M., then check in with the Admitting office. Any questions or running late day of surgery: call [phone]  Questions prior to your surgery date: call [phone], Monday-Friday, 8am-4pm. If you experience any cold or flu symptoms such as cough, fever, chills, shortness of breath, etc. between now and your scheduled surgery, please notify us  at the above number.     Remember:  Do not eat after midnight the night before your surgery   You may drink clear liquids until 4:30 the morning of your surgery.   Clear liquids allowed are: Water, Non-Citrus Juices (without pulp), Carbonated Beverages, Clear Tea (no milk, honey, etc.), Black Coffee Only (NO MILK, CREAM OR POWDERED CREAMER of any kind), and Gatorade.  Patient Instructions  The night before surgery:  No food after midnight. ONLY clear liquids after midnight  The day of surgery (if you do NOT have diabetes):  Drink ONE (1) Pre-Surgery Clear Ensure by 4:30 the morning of surgery. Drink in one sitting. Do not sip.  This drink was given to you during your hospital  pre-op appointment visit.  Nothing else to drink after completing the  Pre-Surgery Clear Ensure.          If you have questions, please contact your surgeon's office.     Take these medicines the morning of surgery with A SIP OF WATER: Amlodipine  (Norvasc ) Dexilant Metoprolol  Succinate (Toprol -XL)   May take these medicines IF NEEDED: Levocetirizine (Xyzal )   Per your cardiologist, Clopidogrel  (Plavix ) should held for 5 days prior to surgery.  Your last dose should be on Friday, September 26th.  Follow your surgeon's instructions on when to stop your Aspirin .  If no instructions received, reach out to your surgeon's office.   One week prior to surgery, STOP taking any Aleve, Naproxen, Ibuprofen , Motrin , Advil , Goody's,  BC's, all herbal medications, fish oil, and non-prescription vitamins.                     Do NOT Smoke (Tobacco/Vaping) for 24 hours prior to your procedure.  If you use a CPAP at night, you may bring your mask/headgear for your overnight stay.   You will be asked to remove any contacts, glasses, piercing's, hearing aid's, dentures/partials prior to surgery. Please bring cases for these items if needed.    Patients discharged the day of surgery will not be allowed to drive home, and someone needs to stay with them for 24 hours.  SURGICAL WAITING ROOM VISITATION Patients may have no more than 2 support people in the waiting area - these visitors may rotate.   Pre-op nurse will coordinate an appropriate time for 1 ADULT support person, who may not rotate, to accompany patient in pre-op.  Children under the age of 58 must have an adult with them who is not the patient and must remain in the main waiting area with an adult.  If the patient needs to stay at the hospital during part of their recovery, the visitor guidelines for inpatient rooms apply.  Please refer to the [facility] website for the visitor guidelines for any additional information.   If you received a COVID test during your pre-op visit  it is requested that you wear a mask when out in public, stay away from anyone that may not be feeling well and notify your surgeon if you develop symptoms. If you  have been in contact with anyone that has tested positive in the last 10 days please notify you surgeon.      Pre-operative CHG Bathing Instructions   You can play a key role in reducing the risk of infection after surgery. Your skin needs to be as free of germs as possible. You can reduce the number of germs on your skin by washing with CHG (chlorhexidine  gluconate) soap before surgery. CHG is an antiseptic soap that kills germs and continues to kill germs even after washing.   DO NOT use if you have an allergy to  chlorhexidine /CHG or antibacterial soaps. If your skin becomes reddened or irritated, stop using the CHG and notify one of our RNs at [phone].              TAKE A SHOWER THE NIGHT BEFORE SURGERY AND THE DAY OF SURGERY    Please keep in mind the following:  DO NOT shave, including legs and underarms, 48 hours prior to surgery.   You may shave your face before/day of surgery.  Place clean sheets on your bed the night before surgery Use a clean washcloth (not used since being washed) for each shower. DO NOT sleep with pet's night before surgery.  CHG Shower Instructions:  Wash your face and private area with normal soap. If you choose to wash your hair, wash first with your normal shampoo.  After you use shampoo/soap, rinse your hair and body thoroughly to remove shampoo/soap residue.  Turn the water OFF and apply half the bottle of CHG soap to a CLEAN washcloth.  Apply CHG soap ONLY FROM YOUR NECK DOWN TO YOUR TOES (washing for 3-5 minutes)  DO NOT use CHG soap on face, private areas, open wounds, or sores.  Pay special attention to the area where your surgery is being performed.  If you are having back surgery, having someone wash your back for you may be helpful. Wait 2 minutes after CHG soap is applied, then you may rinse off the CHG soap.  Pat dry with a clean towel  Put on clean pajamas    Additional instructions for the day of surgery: DO NOT APPLY any lotions, deodorants, cologne, or perfumes.   Do not wear jewelry or makeup Do not wear nail polish, gel polish, artificial nails, or any other type of covering on natural nails (fingers and toes) Do not bring valuables to the hospital. [facility] is not responsible for valuables/personal belongings. Put on clean/comfortable clothes.  Please brush your teeth.  Ask your nurse before applying any prescription medications to the skin.

## 2024-02-14 ENCOUNTER — Encounter (HOSPITAL_COMMUNITY): Payer: Self-pay

## 2024-02-14 ENCOUNTER — Other Ambulatory Visit: Payer: Self-pay

## 2024-02-14 ENCOUNTER — Encounter (HOSPITAL_COMMUNITY)
Admission: RE | Admit: 2024-02-14 | Discharge: 2024-02-14 | Disposition: A | Discharge status: Home / Self Care | Source: Ambulatory Visit | Attending: Orthopaedic Surgery | Admitting: Orthopaedic Surgery

## 2024-02-14 VITALS — BP 141/70 | HR 62 | Temp 98.1°F | Resp 18 | Ht 71.0 in | Wt 202.7 lb

## 2024-02-14 DIAGNOSIS — K219 Gastro-esophageal reflux disease without esophagitis: Secondary | ICD-10-CM | POA: Insufficient documentation

## 2024-02-14 DIAGNOSIS — Z79899 Other long term (current) drug therapy: Secondary | ICD-10-CM | POA: Diagnosis not present

## 2024-02-14 DIAGNOSIS — K449 Diaphragmatic hernia without obstruction or gangrene: Secondary | ICD-10-CM | POA: Insufficient documentation

## 2024-02-14 DIAGNOSIS — I10 Essential (primary) hypertension: Secondary | ICD-10-CM | POA: Diagnosis not present

## 2024-02-14 DIAGNOSIS — I252 Old myocardial infarction: Secondary | ICD-10-CM | POA: Diagnosis not present

## 2024-02-14 DIAGNOSIS — Z01812 Encounter for preprocedural laboratory examination: Secondary | ICD-10-CM | POA: Insufficient documentation

## 2024-02-14 DIAGNOSIS — E785 Hyperlipidemia, unspecified: Secondary | ICD-10-CM | POA: Insufficient documentation

## 2024-02-14 DIAGNOSIS — G4733 Obstructive sleep apnea (adult) (pediatric): Secondary | ICD-10-CM | POA: Insufficient documentation

## 2024-02-14 DIAGNOSIS — Z7902 Long term (current) use of antithrombotics/antiplatelets: Secondary | ICD-10-CM | POA: Diagnosis not present

## 2024-02-14 DIAGNOSIS — I2510 Atherosclerotic heart disease of native coronary artery without angina pectoris: Secondary | ICD-10-CM | POA: Diagnosis not present

## 2024-02-14 DIAGNOSIS — Z01818 Encounter for other preprocedural examination: Secondary | ICD-10-CM

## 2024-02-14 HISTORY — DX: Personal history of urinary calculi: Z87.442

## 2024-02-14 HISTORY — DX: Unspecified osteoarthritis, unspecified site: M19.90

## 2024-02-14 HISTORY — DX: Family history of other specified conditions: Z84.89

## 2024-02-14 LAB — CBC
HCT: 49.1 % (ref 39.0–52.0)
Hemoglobin: 16.3 g/dL (ref 13.0–17.0)
MCH: 29.4 pg (ref 26.0–34.0)
MCHC: 33.2 g/dL (ref 30.0–36.0)
MCV: 88.5 fL (ref 80.0–100.0)
Platelets: 215 K/uL (ref 150–400)
RBC: 5.55 MIL/uL (ref 4.22–5.81)
RDW: 13.4 % (ref 11.5–15.5)
WBC: 8.2 K/uL (ref 4.0–10.5)
nRBC: 0 % (ref 0.0–0.2)

## 2024-02-14 LAB — BASIC METABOLIC PANEL WITH GFR
Anion gap: 10 (ref 5–15)
BUN: 20 mg/dL (ref 8–23)
CO2: 26 mmol/L (ref 22–32)
Calcium: 8.9 mg/dL (ref 8.9–10.3)
Chloride: 102 mmol/L (ref 98–111)
Creatinine, Ser: 1.02 mg/dL (ref 0.61–1.24)
GFR, Estimated: >60 mL/min (ref >60)
Glucose, Bld: 106 mg/dL — ABNORMAL HIGH (ref 70–99)
Potassium: 4.2 mmol/L (ref 3.5–5.1)
Sodium: 138 mmol/L (ref 135–145)

## 2024-02-14 LAB — NO BLOOD PRODUCTS

## 2024-02-14 NOTE — Progress Notes (Signed)
 PCP - Dr. Laurance Rafter Cardiologist - Dr. Shelda Bruckner - LOV 12/23/23 (clearance)  PPM/ICD - denies  Device Orders - n/a Rep Notified -  n/a  Chest x-ray - denies EKG - 12/23/23 Stress Test - denies ECHO - 01/25/24 Cardiac Cath -08/31/18   Sleep Study - OSA+ and wears CPAP at night - does not know settings   No DM  Last dose of GLP1 agonist-  n/a GLP1 instructions:  n/a  Blood Thinner Instructions:  last dose of Plavix  was 9/24 Aspirin  Instructions: last dose of Aspirin  was 9/29 - called Dr. Damian office while patient was at PAT appointment for these instructions  ERAS Protcol - clears until 0430 PRE-SURGERY Ensure or G2- Ensure as ordered  COVID TEST- n/a   Anesthesia review: yes - cardiac clearance  Patient denies shortness of breath, fever, cough and chest pain at PAT appointment   All instructions explained to the patient, with a verbal understanding of the material. Patient agrees to go over the instructions while at home for a better understanding. Patient also instructed to self quarantine after being tested for COVID-19. The opportunity to ask questions was provided.  Patient refuses all blood products.  Notified Dr. Vernetta.

## 2024-02-15 NOTE — Anesthesia Preprocedure Evaluation (Signed)
 Anesthesia Evaluation  Patient identified by MRN, date of birth, ID band  Reviewed: Allergy & Precautions, NPO status , Patient's Chart, lab work & pertinent test results  History of Anesthesia Complications Negative for: history of anesthetic complications  Airway Mallampati: II       Dental  (+) Teeth Intact, Dental Advisory Given, Chipped   Pulmonary sleep apnea    breath sounds clear to auscultation       Cardiovascular hypertension, + CAD, + Past MI and + Cardiac Stents  (-) dysrhythmias (-) pacemaker(-) Valvular Problems/Murmurs Rhythm:Regular Rate:Normal     Neuro/Psych    GI/Hepatic ,GERD  Medicated,,  Endo/Other    Renal/GU      Musculoskeletal   Abdominal   Peds  Hematology   Anesthesia Other Findings   Reproductive/Obstetrics                              Anesthesia Physical Anesthesia Plan  ASA: 2  Anesthesia Plan: General   Post-op Pain Management:    Induction: Intravenous  PONV Risk Score and Plan: 1 and Ondansetron  and Dexamethasone   Airway Management Planned: Oral ETT  Additional Equipment: None  Intra-op Plan:   Post-operative Plan: Extubation in OR  Informed Consent:      Dental advisory given  Plan Discussed with: CRNA  Anesthesia Plan Comments: (PAT note by Lynwood Hope, PA-C: 63 year old male follows with cardiology for history of CAD (DES LAD & D2 07/20/05; NSTEMI, s/p PTCA D2 for ISR, DES PLOM 08/22/12), HTN, HLD, OSA on CPAP.  Most recent cath 08/31/2018 showed patent stents with otherwise minimal CAD.  Seen by Rosaline Bane, NP on 12/23/2023 for preop evaluation.  Per note he remains active and denies chest pain, dyspnea, or other symptoms concerning for angina.  He was given instructions for holding DAPT.  Echo was also ordered.  Echo 01/25/2024 showed LVEF 55 to 60%, normal wall motion, normal RV, no significant valvular abnormalities. Patient reports  last dose Plavix  02/09/2024. 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  Anesthesia History: 02/04/2021 Incisional Hernia @ OSH: DL w/ MAC 4 x1, Gr 1 view, 7.5 ETT, extubated in OR w/o mention of ACs   Plan for general anesthesia with ETT secondary to diaphragmatic +/- hiatal hernia, PIV x 2. MAP goal > 75 with his history of CAD s/p PCI. Listed for Blood product refusal on chart, however, after discussion patient would be willing to receive any and all blood products in an emergency to save his life.     )         Anesthesia Quick Evaluation

## 2024-02-15 NOTE — Progress Notes (Signed)
 Anesthesia Chart Review:  63 year old male follows with cardiology for history of CAD (DES LAD & D2 07/20/05; NSTEMI, s/p PTCA D2 for ISR, DES PLOM 08/22/12), HTN, HLD, OSA on CPAP.  Most recent cath 08/31/2018 showed patent stents with otherwise minimal CAD.  Seen by Rosaline Bane, NP on 12/23/2023 for preop evaluation.  Per note he remains active and denies chest pain, dyspnea, or other symptoms concerning for angina.  He was given instructions for holding DAPT.  Echo was also ordered.  Echo 01/25/2024 showed LVEF 55 to 60%, normal wall motion, normal RV, no significant valvular abnormalities.  Rosaline commented on result stating he was cleared for surgery.  Patient reports last dose Plavix  02/09/2024.  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  EKG 12/23/2023: NSR.  Rate 66.  Minimal voltage criteria for LVH, may be normal variant.  TTE 01/25/2024: 1. Left ventricular ejection fraction, by estimation, is 55 to 60%. The  left ventricle has normal function. The left ventricle has no regional  wall motion abnormalities. There is mild left ventricular hypertrophy.  Left ventricular diastolic parameters  were normal. The average left ventricular global longitudinal strain is  -18.0 %. The global longitudinal strain is normal.   2. Right ventricular systolic function is normal. The right ventricular  size is normal. Tricuspid regurgitation signal is inadequate for assessing  PA pressure.   3. The mitral valve is normal in structure. Trivial mitral valve  regurgitation. No evidence of mitral stenosis.   4. The aortic valve is tricuspid. There is mild calcification of the  aortic valve. Aortic valve regurgitation is trivial. No aortic stenosis is  present.   5. The inferior vena cava is normal in size with greater than 50%  respiratory variability,  suggesting right atrial pressure of 3 mmHg.   Cath 08/31/2018:  The left ventricular systolic function is normal. The left ventricular ejection fraction is 55-65% by visual estimate. LV end diastolic pressure is normal. ---------------------------------------------- -- Previously placed Prox LAD to Mid LAD DES stent is widely patent. -- Previously placed Ost 1st Diag-2 DES stent is 5% stenosed. -- Previously placed Post Atrio DES stent is widely patent.   SUMMARY Widely patent LAD-diagonal and PLA stents. Otherwise minimal CAD. Normal EF and EDP.   RECOMMENDATIONS Considered non-anginal reason for chest pain. Continue risk factor modification Anticipate discharge later today.    Lynwood Geofm RIGGERS [facility] Short Stay Center/Anesthesiology Phone [phone] 02/15/2024 3:55 PM

## 2024-02-16 NOTE — H&P (Signed)
 PROVIDER: VICENTA DASIE POLI, MD  MRN: [id number] DOB: 1960-07-11  Subjective   Chief Complaint: Hernia   History of Present Illness: [name] [name] is a 63 y.o. male who is seen today for a long-term follow-up. He has a previous history of having had a colon resection for cancer many years ago through a midline incision. I met him in 2019 when he had bilateral inguinal hernias. I was able to do a preperitoneal bilateral laparoscopic inguinal hernia pair with mesh. He then developed a small incisional hernia above the umbilicus at his midline incision which was repaired open with a 4.3 cm round piece of mesh. The fascial defect at that time was 1.5 cm. He is now retired. He reports that he noticed the bulge to the left of his midline incision above the umbilicus about a year ago. This is near the area of his previous open repair. He has had no obstructive symptoms and no discomfort. He has been stable from a cardiac standpoint and remains on Plavix  for history of heart stents    Review of Systems: A complete review of systems was obtained from the patient. I have reviewed this information and discussed as appropriate with the patient. See HPI as well for other ROS.  ROS   Medical History: Past Medical History:  Diagnosis Date  GERD (gastroesophageal reflux disease)  Heart valve disease  History of cancer  Sleep apnea   Patient Active Problem List  Diagnosis  Abrasion of ear region  Arthritis of both acromioclavicular joints  Esophageal reflux  Back pain  Malignant neoplasm of sigmoid colon (CMS/HHS-HCC)  Contusion of dorsum of hand  Coronary atherosclerosis of native coronary artery  Diaphragmatic hernia without mention of obstruction or gangrene  Hand crush injury, left, initial encounter  Hyperlipidemia, unspecified  Hypertension, benign  NSTEMI (non-ST elevated myocardial infarction) (CMS/HHS-HCC)  Obstructive sleep apnea  Personal history of malignant neoplasm of  large intestine  Personal history of other diseases of digestive system  Rectal bleeding  Secondary malignant neoplasm of lung (CMS/HHS-HCC)  Anxiety  Colon polyps  Chest pain  Hypertension, essential  Colon cancer (CMS/HHS-HCC)  Sleep apnea  Right inguinal hernia   Past Surgical History:  Procedure Laterality Date  HERNIA REPAIR  JOINT REPLACEMENT  Knee    Allergies  Allergen Reactions  Shellfish Containing Products Anaphylaxis, Other (See Comments) and Swelling  Large amounts of crab  Isosorbide  Unknown  Levofloxacin Unknown  Other reaction(s): patient refused I am wirried about tendon and joint I am wirried about tendon and joint  Clarithromycin Diarrhea and Rash   Current Outpatient Medications on File Prior to Visit  Medication Sig Dispense Refill  amLODIPine  (NORVASC ) 5 MG tablet Take by mouth  aspirin  81 MG EC tablet Take by mouth  cholecalciferol  (VITAMIN D3) 2,000 unit tablet Take by mouth once daily  clopidogreL  (PLAVIX ) 75 mg tablet Take by mouth  cyanocobalamin  (VITAMIN B12) 1,000 mcg/mL injection Inject into the muscle  dexlansoprazole (DEXILANT) 60 mg DR capsule Take by mouth  enalapril  (VASOTEC ) 10 MG tablet Take by mouth  ezetimibe  (ZETIA ) 10 mg tablet  See Instructions, TAKE 1 TABLET DAILY, # 90 tab, 0 Refill(s), Pharmacy: [facility]  fexofenadine (ALLEGRA) 180 MG tablet Take by mouth  ibuprofen  (MOTRIN ) 800 MG tablet Take by mouth at bedtime as needed  metoprolol  su-hydrochlorothiaz 25-12.5 mg Tb24 Take 25 mg by mouth once daily  omega-3 acid ethyl esters (LOVAZA ) 1 gram capsule Take by mouth  rosuvastatin  (CRESTOR ) 20 MG tablet  Take 20 mg by mouth once daily  montelukast  (SINGULAIR ) 10 mg tablet  See Instructions, TAKE 1 TABLET DAILY, # 90 tab, 4 Refill(s), Pharmacy: [facility]  nitroGLYcerin  (NITROSTAT ) 0.4 MG SL tablet Place under the tongue at bedtime as needed  tadalafiL (CIALIS) 20 MG tablet   No current  facility-administered medications on file prior to visit.   History reviewed. No pertinent family history.   Social History   Tobacco Use  Smoking Status Never  Smokeless Tobacco Never    Social History   Socioeconomic History  Marital status: Married  Tobacco Use  Smoking status: Never  Smokeless tobacco: Never  Vaping Use  Vaping status: Never Used  Substance and Sexual Activity  Alcohol  use: Not Currently  Drug use: Never  Sexual activity: Defer   Objective:   Vitals:   BP: 129/75  Pulse: 77  Temp: 37.1 C (98.8 F)  TempSrc: Temporal  SpO2: 96%  Weight: 91.4 kg (201 lb 9.6 oz)  Height: 180.3 cm (5' 11)   Body mass index is 28.12 kg/m.  Physical Exam   He appears well on exam.  His abdomen is soft and nontender. He has a large midline incision from the xiphoid to the pubis. He has no evidence of recurrent inguinal hernias. There is a fascial defect to the left of his midline above the umbilicus which is approximately 3 cm in size but reducible. There are no other fascial defects palpable along the rest of his midline incision  Labs, Imaging and Diagnostic Testing:  I have reviewed his notes in the electronic medical records.  Assessment and Plan:   Diagnoses and all orders for this visit:  Incisional hernia, without obstruction or gangrene    We again discussed abdominal anatomy and his hernias. At this point, I would should try a laparoscopic incisional hernia repair with mesh which would allow us  to evaluate his entire incision to see if there is any Swiss cheese defects which I do not feel currently. I explained the surgical procedure in detail. We discussed the risk which includes but is not limited to bleeding, infection, injury to surrounding structures including the intestines, use of mesh, hernia recurrence, the need for other procedures, the need to convert to an open procedure, cardiopulmonary issues, blood clots, etc. He understands and wished  to proceed with surgery which will be scheduled likely in October given his current schedule. We did discuss signs and symptoms of incarceration and he will call should he have any issues prior to the time the planned surgery.

## 2024-02-17 ENCOUNTER — Other Ambulatory Visit: Payer: Self-pay

## 2024-02-17 ENCOUNTER — Encounter (HOSPITAL_COMMUNITY): Payer: Self-pay | Admitting: Physician Assistant

## 2024-02-17 ENCOUNTER — Encounter (HOSPITAL_COMMUNITY)
Admission: RE | Disposition: A | Discharge status: Home / Self Care | Payer: Self-pay | Source: Home / Self Care | Attending: Surgery

## 2024-02-17 ENCOUNTER — Encounter (HOSPITAL_COMMUNITY): Payer: Self-pay | Admitting: Surgery

## 2024-02-17 ENCOUNTER — Ambulatory Visit (HOSPITAL_COMMUNITY)

## 2024-02-17 ENCOUNTER — Ambulatory Visit (HOSPITAL_COMMUNITY)
Admission: RE | Admit: 2024-02-17 | Discharge: 2024-02-17 | Disposition: A | Discharge status: Home / Self Care | Attending: Surgery | Admitting: Surgery

## 2024-02-17 DIAGNOSIS — Z955 Presence of coronary angioplasty implant and graft: Secondary | ICD-10-CM | POA: Insufficient documentation

## 2024-02-17 DIAGNOSIS — K219 Gastro-esophageal reflux disease without esophagitis: Secondary | ICD-10-CM | POA: Insufficient documentation

## 2024-02-17 DIAGNOSIS — G4733 Obstructive sleep apnea (adult) (pediatric): Secondary | ICD-10-CM | POA: Diagnosis not present

## 2024-02-17 DIAGNOSIS — I252 Old myocardial infarction: Secondary | ICD-10-CM | POA: Diagnosis not present

## 2024-02-17 DIAGNOSIS — Z7902 Long term (current) use of antithrombotics/antiplatelets: Secondary | ICD-10-CM | POA: Diagnosis not present

## 2024-02-17 DIAGNOSIS — K432 Incisional hernia without obstruction or gangrene: Secondary | ICD-10-CM | POA: Diagnosis present

## 2024-02-17 DIAGNOSIS — I10 Essential (primary) hypertension: Secondary | ICD-10-CM | POA: Diagnosis not present

## 2024-02-17 DIAGNOSIS — I2510 Atherosclerotic heart disease of native coronary artery without angina pectoris: Secondary | ICD-10-CM | POA: Diagnosis not present

## 2024-02-17 DIAGNOSIS — Z79899 Other long term (current) drug therapy: Secondary | ICD-10-CM | POA: Diagnosis not present

## 2024-02-17 HISTORY — PX: LAPAROSCOPY: SHX197

## 2024-02-17 HISTORY — PX: INCISIONAL HERNIA REPAIR: SHX193

## 2024-02-17 SURGERY — REPAIR, HERNIA, INCISIONAL
Anesthesia: General

## 2024-02-17 MED ORDER — ONDANSETRON HCL 4 MG/2ML IJ SOLN
INTRAMUSCULAR | Status: DC | PRN
  Administered 2024-02-17: 4 mg via INTRAVENOUS

## 2024-02-17 MED ORDER — KETOROLAC TROMETHAMINE 30 MG/ML IJ SOLN
INTRAMUSCULAR | Status: DC | PRN
  Administered 2024-02-17: 30 mg via INTRAVENOUS

## 2024-02-17 MED ORDER — LIDOCAINE 2% (20 MG/ML) 5 ML SYRINGE
INTRAMUSCULAR | Status: DC | PRN
  Administered 2024-02-17: 100 mg via INTRAVENOUS

## 2024-02-17 MED ORDER — CEFAZOLIN SODIUM-DEXTROSE 2-4 GM/100ML-% IV SOLN: 2.0000 g | INTRAVENOUS | Status: DC

## 2024-02-17 MED ORDER — MIDAZOLAM HCL 2 MG/2ML IJ SOLN
INTRAMUSCULAR | Status: AC
  Filled 2024-02-17: qty 2

## 2024-02-17 MED ORDER — CHLORHEXIDINE GLUCONATE 0.12 % MT SOLN
OROMUCOSAL | Status: AC
  Administered 2024-02-17: 15 mL via OROMUCOSAL
  Filled 2024-02-17: qty 15

## 2024-02-17 MED ORDER — ONDANSETRON HCL 4 MG/2ML IJ SOLN
INTRAMUSCULAR | Status: AC
  Filled 2024-02-17: qty 2

## 2024-02-17 MED ORDER — PROPOFOL 10 MG/ML IV BOLUS
INTRAVENOUS | Status: AC
  Filled 2024-02-17: qty 20

## 2024-02-17 MED ORDER — OXYCODONE HCL 5 MG PO TABS: 5.0000 mg | ORAL_TABLET | Freq: Four times a day (QID) | ORAL | 0 refills | Status: AC | PRN

## 2024-02-17 MED ORDER — CEFAZOLIN SODIUM-DEXTROSE 2-4 GM/100ML-% IV SOLN
INTRAVENOUS | Status: AC
  Filled 2024-02-17: qty 100

## 2024-02-17 MED ORDER — DROPERIDOL 2.5 MG/ML IJ SOLN: 0.6250 mg | Freq: Once | INTRAMUSCULAR | Status: DC | PRN

## 2024-02-17 MED ORDER — FENTANYL CITRATE (PF) 250 MCG/5ML IJ SOLN
INTRAMUSCULAR | Status: AC
  Filled 2024-02-17: qty 5

## 2024-02-17 MED ORDER — ORAL CARE MOUTH RINSE: 15.0000 mL | Freq: Once | OROMUCOSAL | Status: AC

## 2024-02-17 MED ORDER — ACETAMINOPHEN 500 MG PO TABS: 1000.0000 mg | ORAL_TABLET | ORAL | Status: AC

## 2024-02-17 MED ORDER — ROCURONIUM BROMIDE 10 MG/ML (PF) SYRINGE
PREFILLED_SYRINGE | INTRAVENOUS | Status: DC | PRN
  Administered 2024-02-17: 20 mg via INTRAVENOUS
  Administered 2024-02-17: 60 mg via INTRAVENOUS

## 2024-02-17 MED ORDER — PHENYLEPHRINE HCL-NACL 20-0.9 MG/250ML-% IV SOLN
INTRAVENOUS | Status: DC | PRN
  Administered 2024-02-17: 200 ug/min via INTRAVENOUS

## 2024-02-17 MED ORDER — OXYCODONE HCL 5 MG PO TABS: 5.0000 mg | ORAL_TABLET | Freq: Once | ORAL | Status: DC | PRN

## 2024-02-17 MED ORDER — ROCURONIUM BROMIDE 10 MG/ML (PF) SYRINGE
PREFILLED_SYRINGE | INTRAVENOUS | Status: AC
Start: 2024-02-17 — End: 2024-02-17
  Filled 2024-02-17: qty 10

## 2024-02-17 MED ORDER — ACETAMINOPHEN 10 MG/ML IV SOLN: 1000.0000 mg | Freq: Once | INTRAVENOUS | Status: DC | PRN

## 2024-02-17 MED ORDER — PHENYLEPHRINE 80 MCG/ML (10ML) SYRINGE FOR IV PUSH (FOR BLOOD PRESSURE SUPPORT)
PREFILLED_SYRINGE | INTRAVENOUS | Status: DC | PRN
  Administered 2024-02-17: 40 ug via INTRAVENOUS
  Administered 2024-02-17 (×4): 80 ug via INTRAVENOUS

## 2024-02-17 MED ORDER — LACTATED RINGERS IV SOLN: INTRAVENOUS | Status: DC

## 2024-02-17 MED ORDER — CHLORHEXIDINE GLUCONATE 0.12 % MT SOLN: 15.0000 mL | Freq: Once | OROMUCOSAL | Status: AC

## 2024-02-17 MED ORDER — CHLORHEXIDINE GLUCONATE CLOTH 2 % EX PADS: 6.0000 | MEDICATED_PAD | Freq: Once | CUTANEOUS | Status: DC

## 2024-02-17 MED ORDER — PROPOFOL 10 MG/ML IV BOLUS
INTRAVENOUS | Status: DC | PRN
  Administered 2024-02-17: 20 mg via INTRAVENOUS
  Administered 2024-02-17: 80 mg via INTRAVENOUS
  Administered 2024-02-17: 20 mg via INTRAVENOUS

## 2024-02-17 MED ORDER — DEXAMETHASONE SODIUM PHOSPHATE 10 MG/ML IJ SOLN
INTRAMUSCULAR | Status: DC | PRN
  Administered 2024-02-17: 10 mg via INTRAVENOUS

## 2024-02-17 MED ORDER — ONDANSETRON HCL 4 MG/2ML IJ SOLN: 4.0000 mg | Freq: Once | INTRAMUSCULAR | Status: DC | PRN

## 2024-02-17 MED ORDER — LIDOCAINE 2% (20 MG/ML) 5 ML SYRINGE
INTRAMUSCULAR | Status: AC
Start: 2024-02-17 — End: 2024-02-17
  Filled 2024-02-17: qty 5

## 2024-02-17 MED ORDER — MIDAZOLAM HCL 2 MG/2ML IJ SOLN
INTRAMUSCULAR | Status: DC | PRN
  Administered 2024-02-17: 2 mg via INTRAVENOUS

## 2024-02-17 MED ORDER — FENTANYL CITRATE (PF) 250 MCG/5ML IJ SOLN
INTRAMUSCULAR | Status: DC | PRN
  Administered 2024-02-17: 100 ug via INTRAVENOUS
  Administered 2024-02-17: 50 ug via INTRAVENOUS

## 2024-02-17 MED ORDER — ACETAMINOPHEN 500 MG PO TABS
ORAL_TABLET | ORAL | Status: AC
  Administered 2024-02-17: 1000 mg via ORAL
  Filled 2024-02-17: qty 2

## 2024-02-17 MED ORDER — ENSURE PRE-SURGERY PO LIQD: 296.0000 mL | Freq: Once | ORAL | Status: DC

## 2024-02-17 MED ORDER — DEXAMETHASONE SODIUM PHOSPHATE 10 MG/ML IJ SOLN
INTRAMUSCULAR | Status: AC
Start: 2024-02-17 — End: 2024-02-17
  Filled 2024-02-17: qty 1

## 2024-02-17 MED ORDER — CHLORHEXIDINE GLUCONATE CLOTH 2 % EX PADS
6.0000 | MEDICATED_PAD | Freq: Once | CUTANEOUS | Status: DC
Start: 2024-02-17 — End: 2024-02-17

## 2024-02-17 MED ORDER — BUPIVACAINE-EPINEPHRINE (PF) 0.25% -1:200000 IJ SOLN
INTRAMUSCULAR | Status: AC
  Filled 2024-02-17: qty 30

## 2024-02-17 MED ORDER — FENTANYL CITRATE (PF) 100 MCG/2ML IJ SOLN: 25.0000 ug | INTRAMUSCULAR | Status: DC | PRN

## 2024-02-17 MED ORDER — OXYCODONE HCL 5 MG/5ML PO SOLN: 5.0000 mg | Freq: Once | ORAL | Status: DC | PRN

## 2024-02-17 MED ORDER — BUPIVACAINE-EPINEPHRINE 0.25% -1:200000 IJ SOLN
INTRAMUSCULAR | Status: DC | PRN
  Administered 2024-02-17: 20 mL

## 2024-02-17 MED ORDER — KETOROLAC TROMETHAMINE 30 MG/ML IJ SOLN
INTRAMUSCULAR | Status: AC
  Filled 2024-02-17: qty 1

## 2024-02-17 SURGICAL SUPPLY — 43 items
BAG COUNTER SPONGE SURGICOUNT (BAG) ×2 IMPLANT
BLADE CLIPPER SURG (BLADE) IMPLANT
CANISTER SUCTION 3000ML PPV (SUCTIONS) IMPLANT
CHLORAPREP W/TINT 26 (MISCELLANEOUS) ×2 IMPLANT
CLIP APPLIE 5 13 M/L LIGAMAX5 (MISCELLANEOUS) IMPLANT
CLIP APPLIE ROT 10 11.4 M/L (STAPLE) IMPLANT
COVER SURGICAL LIGHT HANDLE (MISCELLANEOUS) ×2 IMPLANT
DERMABOND ADVANCED .7 DNX12 (GAUZE/BANDAGES/DRESSINGS) ×2 IMPLANT
DEVICE SECURE STRAP 25 ABSORB (INSTRUMENTS) ×2 IMPLANT
DEVICE TROCAR PUNCTURE CLOSURE (ENDOMECHANICALS) ×2 IMPLANT
DRAPE LAPAROSCOPIC ABDOMINAL (DRAPES) ×2 IMPLANT
ELECTRODE REM PT RTRN 9FT ADLT (ELECTROSURGICAL) ×2 IMPLANT
GLOVE SURG SIGNA 7.5 PF LTX (GLOVE) ×2 IMPLANT
GOWN STRL REUS W/ TWL LRG LVL3 (GOWN DISPOSABLE) ×4 IMPLANT
GOWN STRL REUS W/ TWL XL LVL3 (GOWN DISPOSABLE) ×2 IMPLANT
IRRIGATION SUCT STRKRFLW 2 WTP (MISCELLANEOUS) IMPLANT
KIT BASIN OR (CUSTOM PROCEDURE TRAY) ×2 IMPLANT
KIT TURNOVER KIT B (KITS) ×2 IMPLANT
MARKER SKIN DUAL TIP RULER LAB (MISCELLANEOUS) ×2 IMPLANT
MESH VENTRALEX ST 2.5 CRC MED (Mesh General) ×1 IMPLANT
NDL SPNL 22GX3.5 QUINCKE BK (NEEDLE) ×1 IMPLANT
NEEDLE SPNL 22GX3.5 QUINCKE BK (NEEDLE) ×2 IMPLANT
PAD ARMBOARD POSITIONER FOAM (MISCELLANEOUS) ×4 IMPLANT
SCISSORS LAP 5X35 DISP (ENDOMECHANICALS) IMPLANT
SET TUBE SMOKE EVAC HIGH FLOW (TUBING) ×2 IMPLANT
SHEARS HARMONIC 36 ACE (MISCELLANEOUS) IMPLANT
SLEEVE Z-THREAD 5X100MM (TROCAR) ×2 IMPLANT
SOLN 0.9% NACL 1000 ML (IV SOLUTION) ×2 IMPLANT
SOLN 0.9% NACL POUR BTL 1000ML (IV SOLUTION) ×2 IMPLANT
SOLN STERILE WATER 1000 ML (IV SOLUTION) ×2 IMPLANT
SOLN STERILE WATER BTL 1000 ML (IV SOLUTION) ×2 IMPLANT
SPIKE FLUID TRANSFER (MISCELLANEOUS) ×2 IMPLANT
SUT MON AB 4-0 PC3 18 (SUTURE) ×2 IMPLANT
SUT NOVA NAB DX-16 0-1 5-0 T12 (SUTURE) ×2 IMPLANT
SUT NOVA NAB GS-21 0 18 T12 DT (SUTURE) ×2 IMPLANT
SUT VIC AB 3-0 SH 8-18 (SUTURE) ×1 IMPLANT
TOWEL GREEN STERILE (TOWEL DISPOSABLE) ×2 IMPLANT
TOWEL GREEN STERILE FF (TOWEL DISPOSABLE) ×2 IMPLANT
TRAY FOLEY W/BAG SLVR 16FR ST (SET/KITS/TRAYS/PACK) IMPLANT
TRAY LAPAROSCOPIC MC (CUSTOM PROCEDURE TRAY) ×2 IMPLANT
TROCAR 11X100 Z THREAD (TROCAR) ×2 IMPLANT
TROCAR Z-THREAD OPTICAL 5X100M (TROCAR) ×2 IMPLANT
WARMER LAPAROSCOPE (MISCELLANEOUS) ×2 IMPLANT

## 2024-02-17 NOTE — Discharge Instructions (Signed)
[facility] _______[facility]  UMBILICAL OR INGUINAL HERNIA REPAIR: POST OP INSTRUCTIONS  Always review your discharge instruction sheet given to you by the facility where your surgery was performed. IF YOU HAVE DISABILITY OR FAMILY LEAVE FORMS, YOU MUST BRING THEM TO THE OFFICE FOR PROCESSING.   DO NOT GIVE THEM TO YOUR DOCTOR.  1. A  prescription for pain medication may be given to you upon discharge.  Take your pain medication as prescribed, if needed.  If narcotic pain medicine is not needed, then you may take acetaminophen (Tylenol) or ibuprofen (Advil) as needed. 2. Take your usually prescribed medications unless otherwise directed. If you need a refill on your pain medication, please contact your pharmacy.  They will contact our office to request authorization. Prescriptions will not be filled after 5 pm or on week-ends. 3. You should follow a light diet the first 24 hours after arrival home, such as soup and crackers, etc.  Be sure to include lots of fluids daily.  Resume your normal diet the day after surgery. 4.Most patients will experience some swelling and bruising around the umbilicus or in the groin and scrotum.  Ice packs and reclining will help.  Swelling and bruising can take several days to resolve.  6. It is common to experience some constipation if taking pain medication after surgery.  Increasing fluid intake and taking a stool softener (such as Colace) will usually help or prevent this problem from occurring.  A mild laxative (Milk of Magnesia or Miralax) should be taken according to package directions if there are no bowel movements after 48 hours. 7. Unless discharge instructions indicate otherwise, you may remove your bandages 24-48 hours after surgery, and you may shower at that time.  You may have steri-strips (small skin tapes) in place directly over the incision.  These strips should be left on the skin for 7-10 days.  If your surgeon used skin glue on the  incision, you may shower in 24 hours.  The glue will flake off over the next 2-3 weeks.  Any sutures or staples will be removed at the office during your follow-up visit. 8. ACTIVITIES:  You may resume regular (light) daily activities beginning the next day--such as daily self-care, walking, climbing stairs--gradually increasing activities as tolerated.  You may have sexual intercourse when it is comfortable.  Refrain from any heavy lifting or straining until approved by your doctor.  a.You may drive when you are no longer taking prescription pain medication, you can comfortably wear a seatbelt, and you can safely maneuver your car and apply brakes. b.RETURN TO WORK:   _____________________________________________  9.You should see your doctor in the office for a follow-up appointment approximately 2-3 weeks after your surgery.  Make sure that you call for this appointment within a day or two after you arrive home to insure a convenient appointment time. 10.OTHER INSTRUCTIONS: _YOU MAY SHOWER STARTING TOMORROW ICE PACK, TYLENOL, AND IBUPROFEN ALSO FOR PAIN NO LIFTING MORE THAN 15 POUNDS FOR 4 WEEKS________________________    _____________________________________  WHEN TO CALL YOUR DOCTOR: Fever over 101.0 Inability to urinate Nausea and/or vomiting Extreme swelling or bruising Continued bleeding from incision. Increased pain, redness, or drainage from the incision  The clinic staff is available to answer your questions during regular business hours.  Please don't hesitate to call and ask to speak to one of the nurses for clinical concerns.  If you have a medical emergency, go to the nearest emergency room or call 911.  A surgeon from [facility] is always on call at the hospital   [address], [address], [place], [place]  [address] ?  [address] [address], [place], [place]   [address] [phone] ? [phone] ? FAX [phone] Web site: [email or web address] 

## 2024-02-17 NOTE — Transfer of Care (Signed)
 Immediate Anesthesia Transfer of Care Note  Patient: [name] [name]  Procedure(s) Performed: REPAIR, HERNIA, INCISIONAL, OPEN LAPAROSCOPY, DIAGNOSTIC  Patient Location: PACU  Anesthesia Type:General  Level of Consciousness: awake, alert , and sedated  Airway & Oxygen Therapy: Patient Spontanous Breathing and Patient connected to face mask oxygen  Post-op Assessment: Report given to RN and Post -op Vital signs reviewed and stable  Post vital signs: Reviewed and stable  Last Vitals:  Vitals Value Taken Time  BP    Temp 36.4 C 02/17/24 08:32  Pulse 71 02/17/24 08:36  Resp 16 02/17/24 08:36  SpO2 96 % 02/17/24 08:36  Vitals shown include unfiled device data.  Last Pain:  Vitals:   02/17/24 0616  TempSrc:   PainSc: 0-No pain         Complications: No notable events documented.

## 2024-02-17 NOTE — Op Note (Signed)
 [name] [name] 02/17/2024   Pre-op Diagnosis: INCISIONAL HERNIA     Post-op Diagnosis: INCISIONAL HERNIA WITH 1.5 CM FASCIAL DEFECT  Procedure(s): DIAGNOSTIC LAPAROSCOPY OPEN INCISIONAL HERNIA REPAIR WITH MESH (6.4 CM ROUND VENTRAL PROLENE ST PATCH FROM BARD)  Surgeon(s): [name] Berg, MD  Anesthesia: General  Staff:  Circulator: Prentiss Shona NOVAK, RN Scrub Person: Melvenia Fairy HERO, RN; Henry Lauraine PARAS, RN  Estimated Blood Loss: Minimal               Indications: This is a 63 year old gentleman who has had a previous exploratory laparotomy and partial colectomy for a colon cancer many years ago.  He presented back in 2019 for a small incisional hernia with a 1.5 cm fascial defect which was repaired with open for small well piece of mesh.  He recently noticed a another small hernia at the midline lateral to the previous repair.  Decision was made to proceed with a laparoscopic, possible open incisional hernia pair with mesh  Findings: After inserting the Optiview camera and visualizing the abdomen, the patient was found to have extensive adhesions with omentum completely covering the midline all the way to the pelvis and right abdomen.  There was extensive small bowel loops fixated to the left abdominal sidewall and other areas are throughout the abdomen small bowel stuck to the abdominal wall.  I can visualize the fascial defect at the midline which contained only omentum and was quite small.  The decision was made to convert to an open procedure to fix the small hernia rather than attempting extensive lysis of adhesions which would have been necessary to place any of the ports.  Procedure: The patient was brought to the operating room and identified the correct patient.  He was placed upon on the operating table and general anesthesia was induced.  His abdomen was prepped and draped in the usual sterile fashion.  I made a small 5 mm incision in the patient's left upper quadrant  with a scalpel.  Using the 5 mm trocar and Optiview camera I slowly traversed all layers of the abdominal wall and gained entrance into the abdominal cavity under direct vision.  The abdomen was insufflated with carbon dioxide.  I reinserted the camera and the patient is found to have extensive adhesions of omentum from the midline to the right lower abdomen completely covering the entire abdominal wall.  He also had extensive lesions of small bowel in the left side of the abdomen.  I could easily see the fascial defect containing only omentum and was only approximate 1.5 cm in size.  I evaluated the trocar introduction site and send no evidence of injury.  Given the extensive adhesions as well as small size of the hernia defect I elected to abort the laparoscopic procedure and perform an open hernia repair with mesh.  He would have required extensive lysis adhesions just to place further trocars.  I made an incision over the top of the palpable hernia just the left of his midline incision with a scalpel.  I then dissected down to the hernia sac.  I removed the 5 mm trocar and deflated the abdomen and opened the sac.  Again it contained only omentum which was easily reduced back into the abdominal cavity.  I excised the sac.  This was just lateral to his previously placed mesh which I can feel on palpation.  The fascial defect was only 1.5 cm in size.  Do not feel any adhesions palpating around the  fascial edges intra-abdominal  through the defect. A 0.4 cm round ventral Prolene patch coated on 1 side from Bard was brought to the field.  I placed it through the fascial opening and then pulled it up against the peritoneum with the ties.  I then sutured the mesh in place to the fascia with interrupted #1 Novafil sutures.  I then cut the ties and closed the fascia over the top of the mesh with 2 figure-of-eight #1 Novafil sutures.  Hemostasis appeared to be achieved.  I injected the fascia and subcutaneous tissue  with Marcaine .  The midline incision was then closed with interrupted 3-0 Vicryl sutures and 4-0 Monocryl sutures.  The 5 mm trocar site was also closed with 4-0 Monocryl.  Dermabond was then applied.  The patient tolerated the procedure well.  All counts were correct at the end of the procedure.  Patient was then extubated in the operating room and taken in stable condition to the recovery room. [name]   Date: 02/17/2024  Time: 8:16 AM

## 2024-02-17 NOTE — Anesthesia Procedure Notes (Signed)
 Procedure Name: Intubation Date/Time: 02/17/2024 7:36 AM  Performed by: Erik Burkett C, CRNAPre-anesthesia Checklist: Patient identified, Emergency Drugs available, Suction available and Patient being monitored Patient Re-evaluated:Patient Re-evaluated prior to induction Oxygen Delivery Method: Circle system utilized Preoxygenation: Pre-oxygenation with 100% oxygen Induction Type: IV induction Ventilation: Mask ventilation without difficulty Laryngoscope Size: Mac and 4 Grade View: Grade I Tube type: Oral Tube size: 7.5 mm Number of attempts: 1 Airway Equipment and Method: Stylet and Oral airway Placement Confirmation: ETT inserted through vocal cords under direct vision, positive ETCO2 and breath sounds checked- equal and bilateral Secured at: 23 cm Tube secured with: Tape Dental Injury: Teeth and Oropharynx as per pre-operative assessment

## 2024-02-17 NOTE — Interval H&P Note (Signed)
 History and Physical Interval Note:no change in H and P  02/17/2024 7:05 AM  [name] [name]  has presented today for surgery, with the diagnosis of INCISIONAL HERNIA.  The various methods of treatment have been discussed with the patient and family. After consideration of risks, benefits and other options for treatment, the patient has consented to  Procedure(s) with comments: REPAIR, HERNIA, INCISIONAL, LAPAROSCOPIC (N/A) - POSSIBLE OPEN as a surgical intervention.  The patient's history has been reviewed, patient examined, no change in status, stable for surgery.  I have reviewed the patient's chart and labs.  Questions were answered to the patient's satisfaction.     [name] [name]

## 2024-02-17 NOTE — Anesthesia Postprocedure Evaluation (Signed)
 Anesthesia Post Note  Patient: FLORENTINO LAABS  Procedure(s) Performed: REPAIR, HERNIA, INCISIONAL, OPEN LAPAROSCOPY, DIAGNOSTIC     Patient location during evaluation: PACU Anesthesia Type: General Level of consciousness: awake Pain management: pain level controlled Vital Signs Assessment: post-procedure vital signs reviewed and stable Respiratory status: spontaneous breathing Cardiovascular status: blood pressure returned to baseline Postop Assessment: adequate PO intake and no apparent nausea or vomiting Anesthetic complications: no   No notable events documented.  Last Vitals:  Vitals:   02/17/24 0845 02/17/24 0900  BP: 114/71 130/79  Pulse: 67 65  Resp: 18 14  Temp:  (!) 36.1 C  SpO2: 94% 98%    Last Pain:  Vitals:   02/17/24 0900  TempSrc:   PainSc: 0-No pain                 Lauraine KATHEE [place]

## 2024-02-18 ENCOUNTER — Encounter (HOSPITAL_COMMUNITY): Payer: Self-pay | Admitting: Surgery

## 2024-04-25 ENCOUNTER — Encounter (HOSPITAL_COMMUNITY): Payer: Self-pay | Admitting: Surgery
